# Patient Record
Sex: Female | Born: 1960
Health system: Southern US, Community
[De-identification: ages and names within clinical notes are randomized; demographics above are authoritative.]

## PROBLEM LIST (undated history)

## (undated) DIAGNOSIS — Z8619 Personal history of other infectious and parasitic diseases: Secondary | ICD-10-CM

## (undated) DIAGNOSIS — D649 Anemia, unspecified: Secondary | ICD-10-CM

## (undated) DIAGNOSIS — I499 Cardiac arrhythmia, unspecified: Secondary | ICD-10-CM

## (undated) DIAGNOSIS — J45909 Unspecified asthma, uncomplicated: Secondary | ICD-10-CM

## (undated) DIAGNOSIS — M797 Fibromyalgia: Secondary | ICD-10-CM

## (undated) DIAGNOSIS — M1990 Unspecified osteoarthritis, unspecified site: Secondary | ICD-10-CM

## (undated) DIAGNOSIS — L309 Dermatitis, unspecified: Secondary | ICD-10-CM

## (undated) DIAGNOSIS — T7840XA Allergy, unspecified, initial encounter: Secondary | ICD-10-CM

## (undated) DIAGNOSIS — Z8489 Family history of other specified conditions: Secondary | ICD-10-CM

## (undated) DIAGNOSIS — R739 Hyperglycemia, unspecified: Secondary | ICD-10-CM

## (undated) DIAGNOSIS — E785 Hyperlipidemia, unspecified: Secondary | ICD-10-CM

## (undated) DIAGNOSIS — J189 Pneumonia, unspecified organism: Secondary | ICD-10-CM

## (undated) HISTORY — DX: Personal history of other infectious and parasitic diseases: Z86.19

## (undated) HISTORY — DX: Hyperglycemia, unspecified: R73.9

## (undated) HISTORY — DX: Hyperlipidemia, unspecified: E78.5

## (undated) HISTORY — DX: Dermatitis, unspecified: L30.9

## (undated) HISTORY — DX: Allergy, unspecified, initial encounter: T78.40XA

## (undated) HISTORY — DX: Unspecified osteoarthritis, unspecified site: M19.90

## (undated) HISTORY — DX: Unspecified asthma, uncomplicated: J45.909

## (undated) HISTORY — PX: CERVICAL BIOPSY: SHX590

---

## 2000-06-12 ENCOUNTER — Emergency Department (HOSPITAL_COMMUNITY): Admission: EM | Admit: 2000-06-12 | Discharge: 2000-06-12 | Payer: Self-pay | Admitting: Emergency Medicine

## 2000-10-29 ENCOUNTER — Other Ambulatory Visit: Admission: RE | Admit: 2000-10-29 | Discharge: 2000-10-29 | Payer: Self-pay | Admitting: *Deleted

## 2003-12-28 ENCOUNTER — Ambulatory Visit (HOSPITAL_COMMUNITY): Admission: RE | Admit: 2003-12-28 | Discharge: 2003-12-28 | Payer: Self-pay | Admitting: Obstetrics & Gynecology

## 2004-01-06 ENCOUNTER — Emergency Department (HOSPITAL_COMMUNITY): Admission: EM | Admit: 2004-01-06 | Discharge: 2004-01-06 | Payer: Self-pay | Admitting: Emergency Medicine

## 2004-08-15 ENCOUNTER — Ambulatory Visit (HOSPITAL_COMMUNITY): Admission: RE | Admit: 2004-08-15 | Discharge: 2004-08-15 | Payer: Self-pay | Admitting: Obstetrics & Gynecology

## 2004-08-23 ENCOUNTER — Inpatient Hospital Stay (HOSPITAL_COMMUNITY): Admission: AD | Admit: 2004-08-23 | Discharge: 2004-08-23 | Payer: Self-pay | Admitting: Obstetrics

## 2004-12-27 ENCOUNTER — Inpatient Hospital Stay (HOSPITAL_COMMUNITY): Admission: AD | Admit: 2004-12-27 | Discharge: 2004-12-27 | Payer: Self-pay | Admitting: Obstetrics

## 2005-02-20 ENCOUNTER — Inpatient Hospital Stay (HOSPITAL_COMMUNITY): Admission: AD | Admit: 2005-02-20 | Discharge: 2005-02-20 | Payer: Self-pay | Admitting: Obstetrics & Gynecology

## 2005-03-18 ENCOUNTER — Inpatient Hospital Stay (HOSPITAL_COMMUNITY): Admission: AD | Admit: 2005-03-18 | Discharge: 2005-03-21 | Payer: Self-pay | Admitting: Obstetrics

## 2005-07-22 ENCOUNTER — Encounter: Admission: RE | Admit: 2005-07-22 | Discharge: 2005-08-18 | Payer: Self-pay | Admitting: Obstetrics & Gynecology

## 2005-08-19 ENCOUNTER — Encounter: Admission: RE | Admit: 2005-08-19 | Discharge: 2005-09-18 | Payer: Self-pay | Admitting: Obstetrics & Gynecology

## 2005-09-19 ENCOUNTER — Encounter: Admission: RE | Admit: 2005-09-19 | Discharge: 2005-10-18 | Payer: Self-pay | Admitting: Obstetrics & Gynecology

## 2005-10-19 ENCOUNTER — Encounter: Admission: RE | Admit: 2005-10-19 | Discharge: 2005-11-18 | Payer: Self-pay | Admitting: Obstetrics & Gynecology

## 2005-11-19 ENCOUNTER — Encounter: Admission: RE | Admit: 2005-11-19 | Discharge: 2005-12-18 | Payer: Self-pay | Admitting: Obstetrics & Gynecology

## 2005-12-19 ENCOUNTER — Encounter: Admission: RE | Admit: 2005-12-19 | Discharge: 2006-01-18 | Payer: Self-pay | Admitting: Obstetrics & Gynecology

## 2006-01-19 ENCOUNTER — Encounter: Admission: RE | Admit: 2006-01-19 | Discharge: 2006-02-18 | Payer: Self-pay | Admitting: Obstetrics & Gynecology

## 2006-02-19 ENCOUNTER — Encounter: Admission: RE | Admit: 2006-02-19 | Discharge: 2006-03-20 | Payer: Self-pay | Admitting: Obstetrics & Gynecology

## 2006-03-21 ENCOUNTER — Encounter: Admission: RE | Admit: 2006-03-21 | Discharge: 2006-04-20 | Payer: Self-pay | Admitting: Obstetrics & Gynecology

## 2006-04-21 ENCOUNTER — Encounter: Admission: RE | Admit: 2006-04-21 | Discharge: 2006-05-19 | Payer: Self-pay | Admitting: Obstetrics & Gynecology

## 2006-06-16 HISTORY — PX: BUNIONECTOMY: SHX129

## 2007-03-17 ENCOUNTER — Ambulatory Visit (HOSPITAL_BASED_OUTPATIENT_CLINIC_OR_DEPARTMENT_OTHER): Admission: RE | Admit: 2007-03-17 | Discharge: 2007-03-17 | Payer: Self-pay | Admitting: Orthopedic Surgery

## 2008-12-01 ENCOUNTER — Emergency Department (HOSPITAL_COMMUNITY): Admission: EM | Admit: 2008-12-01 | Discharge: 2008-12-01 | Payer: Self-pay | Admitting: Emergency Medicine

## 2010-02-20 ENCOUNTER — Ambulatory Visit (HOSPITAL_BASED_OUTPATIENT_CLINIC_OR_DEPARTMENT_OTHER): Admission: RE | Admit: 2010-02-20 | Discharge: 2010-02-20 | Payer: Self-pay | Admitting: Orthopedic Surgery

## 2010-02-20 ENCOUNTER — Ambulatory Visit: Payer: Self-pay | Admitting: Diagnostic Radiology

## 2010-02-27 ENCOUNTER — Ambulatory Visit: Payer: Self-pay | Admitting: Family

## 2010-02-27 DIAGNOSIS — J329 Chronic sinusitis, unspecified: Secondary | ICD-10-CM | POA: Insufficient documentation

## 2010-02-27 DIAGNOSIS — R0789 Other chest pain: Secondary | ICD-10-CM | POA: Insufficient documentation

## 2010-02-27 DIAGNOSIS — L259 Unspecified contact dermatitis, unspecified cause: Secondary | ICD-10-CM

## 2010-02-27 DIAGNOSIS — B359 Dermatophytosis, unspecified: Secondary | ICD-10-CM | POA: Insufficient documentation

## 2010-02-28 ENCOUNTER — Telehealth: Payer: Self-pay | Admitting: Family

## 2010-02-28 DIAGNOSIS — E785 Hyperlipidemia, unspecified: Secondary | ICD-10-CM | POA: Insufficient documentation

## 2010-03-06 ENCOUNTER — Telehealth: Payer: Self-pay | Admitting: Family

## 2010-03-15 ENCOUNTER — Ambulatory Visit: Payer: Self-pay | Admitting: Family

## 2010-03-15 DIAGNOSIS — S91309A Unspecified open wound, unspecified foot, initial encounter: Secondary | ICD-10-CM | POA: Insufficient documentation

## 2010-04-03 ENCOUNTER — Telehealth: Payer: Self-pay | Admitting: Family

## 2010-04-17 DIAGNOSIS — Z9189 Other specified personal risk factors, not elsewhere classified: Secondary | ICD-10-CM | POA: Insufficient documentation

## 2010-04-17 DIAGNOSIS — T7840XA Allergy, unspecified, initial encounter: Secondary | ICD-10-CM | POA: Insufficient documentation

## 2010-04-17 DIAGNOSIS — E7800 Pure hypercholesterolemia, unspecified: Secondary | ICD-10-CM

## 2010-04-17 DIAGNOSIS — H8309 Labyrinthitis, unspecified ear: Secondary | ICD-10-CM | POA: Insufficient documentation

## 2010-04-17 DIAGNOSIS — M129 Arthropathy, unspecified: Secondary | ICD-10-CM | POA: Insufficient documentation

## 2010-04-17 HISTORY — DX: Other specified personal risk factors, not elsewhere classified: Z91.89

## 2010-04-18 ENCOUNTER — Ambulatory Visit: Payer: Self-pay | Admitting: Cardiology

## 2010-04-18 ENCOUNTER — Encounter: Payer: Self-pay | Admitting: Cardiology

## 2010-05-23 ENCOUNTER — Telehealth (INDEPENDENT_AMBULATORY_CARE_PROVIDER_SITE_OTHER): Payer: Self-pay | Admitting: *Deleted

## 2010-05-27 ENCOUNTER — Ambulatory Visit (HOSPITAL_COMMUNITY)
Admission: RE | Admit: 2010-05-27 | Discharge: 2010-05-27 | Payer: Self-pay | Source: Home / Self Care | Attending: Cardiology | Admitting: Cardiology

## 2010-05-27 ENCOUNTER — Ambulatory Visit: Payer: Self-pay

## 2010-05-27 ENCOUNTER — Encounter (INDEPENDENT_AMBULATORY_CARE_PROVIDER_SITE_OTHER): Payer: Self-pay | Admitting: *Deleted

## 2010-06-05 ENCOUNTER — Telehealth: Payer: Self-pay | Admitting: Family

## 2010-07-07 ENCOUNTER — Encounter: Payer: Self-pay | Admitting: Obstetrics & Gynecology

## 2010-07-14 LAB — CONVERTED CEMR LAB
ALT: 17 units/L (ref 0–35)
AST: 17 units/L (ref 0–37)
Basophils Absolute: 0 10*3/uL (ref 0.0–0.1)
Bilirubin, Direct: 0.1 mg/dL (ref 0.0–0.3)
Calcium: 9.6 mg/dL (ref 8.4–10.5)
Creatinine, Ser: 0.69 mg/dL (ref 0.40–1.20)
Eosinophils Relative: 4 % (ref 0–5)
HCT: 46.9 % — ABNORMAL HIGH (ref 36.0–46.0)
Hemoglobin: 15.5 g/dL — ABNORMAL HIGH (ref 12.0–15.0)
LDL Cholesterol: 231 mg/dL — ABNORMAL HIGH (ref 0–99)
Lymphocytes Relative: 37 % (ref 12–46)
MCHC: 33 g/dL (ref 30.0–36.0)
Neutro Abs: 4.4 10*3/uL (ref 1.7–7.7)
Neutrophils Relative %: 53 % (ref 43–77)
Pap Smear: NORMAL
RBC: 5.14 M/uL — ABNORMAL HIGH (ref 3.87–5.11)
RDW: 13.5 % (ref 11.5–15.5)
Sodium: 138 meq/L (ref 135–145)
TSH: 1.2 microintl units/mL (ref 0.350–4.500)
Total Bilirubin: 0.9 mg/dL (ref 0.3–1.2)
Total CHOL/HDL Ratio: 5
VLDL: 19 mg/dL (ref 0–40)

## 2010-07-16 NOTE — Progress Notes (Signed)
Summary: Allergic reaction  Phone Note Call from Patient Call back at Home Phone [phone]   Caller: Patient Summary of Call: Pt states doxycycl  HYCL gave her an allergic reaction, hives, itching, throat felt "funny", pt is no longer taking medicine & has received steroid shot and is taking benedryl  Initial call taken by: Lannette Donath,  March 06, 2010 10:15 AM  Follow-up for Phone Call        call returned to patient at [phone], patient states she is at the gym  on a treadmill and will return the call, she can not speak right now Follow-up by: Glendell Docker CMA,  March 06, 2010 1:49 PM  Additional Follow-up for Phone Call Additional follow up Details #1::        Felt itchy, on the 5th day developed hives/rash, eye swelling.  Went to medical services and had a "steroid shot"  Took benadryl and slept for several hours.  Denies SOB,  denies current tongue/lip swelling. Sinuses feel better with benadryl.   Additional Follow-up by: Lemont Fillers FNP,  March 06, 2010 2:36 PM   New Allergies: ! DOXYCYCLINE HYCLATE (DOXYCYCLINE HYCLATE) New Allergies: ! DOXYCYCLINE HYCLATE (DOXYCYCLINE HYCLATE)

## 2010-07-16 NOTE — Assessment & Plan Note (Signed)
Summary: STEP ON NAIL YESTERDAY/ HEA--Rm 4   Vital Signs:  Patient profile:   50 year old female Height:      67 inches Weight:      201.50 pounds BMI:     31.67 Temp:     98.0 degrees F oral Pulse rate:   78 / minute Pulse rhythm:   regular Resp:     16 per minute BP sitting:   120 / 80  (right arm) Cuff size:   regular  Vitals Entered By: Mervin Kung CMA Duncan Dull) (March 15, 2010 3:36 PM) CC: Rm 4  Pt states she stepped on a rusty nail with her left foot yesterday. Last tetanus shot was greater than 10 yrs but less than 5 yrs.   CC:  Rm 4  Pt states she stepped on a rusty nail with her left foot yesterday. Last tetanus shot was greater than 10 yrs but less than 5 yrs..  History of Present Illness: Christine Rodgers is a 50 year old female who presents today following a puncture wound  in her backyard yesterday with a rusty nail.  She was barefoot when this occurred.  She reports that she soaked and cleansed the wound with peroxide.  Pateint reports that her  last tetanus shot was between 5 and 10 years ago.    Allergies: 1)  ! Pcn 2)  ! * Emycins 3)  ! * Anti Inflammatories 4)  ! Asa 5)  ! Codeine 6)  ! Doxycycline Hyclate (Doxycycline Hyclate) 7)  Sulfa  Past History:  Past Medical History: Last updated: 02/27/2010 Arthritis Chicken Pox Allergies Hypercholesterolemia  Past Surgical History: Last updated: 02/27/2010 Bunion removed right foot--2008  Physical Exam  General:  Well-developed,well-nourished,in no acute distress; alert,appropriate and cooperative throughout examination Extremities:  small puncture wound noted on sole of her left foot (in arch).  No drainage or erythema.  + tenderness.   Impression & Recommendations:  Problem # 1:  WOUND, FOOT (ICD-892.0) declines abx.  Pt give tetanus here in the office.  Pt instructed to call us immediately if she develops fever, swelling, redness or drainge from wound.  Complete Medication List: 1)  Voltaren 1  % Gel (Diclofenac sodium) .... Apply up to 4 -5 times a day. 2)  Mirena 20 Mcg/24hr Iud (Levonorgestrel) 3)  Lotrisone 1-0.05 % Crea (Clotrimazole-betamethasone) .... Apply twice daily to affected area until healed. 4)  Doxycycline Hyclate 100 Mg Caps (Doxycycline hyclate) .... One cap by mouth twice daily for 10 days  Other Orders: Tdap => 78yrs IM (81191) Admin 1st Vaccine (47829)  Patient Instructions: 1)  Call if you develop fever, redness, swelling, or increased.    Immunizations Administered:  Tetanus Vaccine:    Vaccine Type: Tdap    Site: left deltoid    Mfr: GlaxoSmithKline    Dose: 0.5 ml    Route: IM    Given by: Mervin Kung CMA (AAMA)    Exp. Date: 04/04/2012    Lot #: [id number]    VIS given: 05/03/08 version given March 15, 2010.   Current Allergies (reviewed today): ! PCN ! * EMYCINS ! * ANTI INFLAMMATORIES ! ASA ! CODEINE ! DOXYCYCLINE HYCLATE (DOXYCYCLINE HYCLATE) SULFA

## 2010-07-16 NOTE — Assessment & Plan Note (Signed)
Summary: new pt cpx/dt--Rm 5   Vital Signs:  Patient profile:   50 year old female Height:      67 inches Weight:      197.50 pounds BMI:     31.04 Temp:     97.9 degrees F oral Pulse rate:   78 / minute Pulse rhythm:   regular Resp:     16 per minute BP sitting:   106 / 78  (right arm) Cuff size:   large  Vitals Entered By: Mervin Kung CMA Duncan Dull) (February 27, 2010 11:32 AM) CC: Rm 5  New pt to establish care. Has eczema on right knee. Has pain behind right eye and right ear for a few weeks. Is Patient Diabetic? No   CC:  Rm 5  New pt to establish care. Has eczema on right knee. Has pain behind right eye and right ear for a few weeks.Marland Kitchen  History of Present Illness: Christine Rodgers is a 49 year old female who presents today to establish care.  She has several concerns today:  1) Eye pain behind right eye, worse when she gazes to the left.  Has had some right ear discomfort.  Denies nasal discharge or fever, no cough.  Denies visual changes or double vision.    2)Notes that she has eczema her whole life.  Has had spot beneath right knee cap x 9 months, itching.   3)Allergies- stable  4)Chest pain- see ROS  Preventive Screening-Counseling & Management  Alcohol-Tobacco     Alcohol drinks/day: 1 glass wine every evening     Smoking Status: quit     Pack years: 1 yr  Caffeine-Diet-Exercise     Caffeine use/day: 2 mugs coffee daily     Does Patient Exercise: yes     Type of exercise: running     Exercise (avg: min/session): 60 min     Times/week: 4  EKG  Procedure date:  02/27/2010  Findings:      normal:  Normal sinus rhythm with rate of:  63 bpm  Allergies (verified): 1)  ! Pcn 2)  ! * Emycins 3)  ! * Anti Inflammatories 4)  ! Asa 5)  ! Codeine 6)  Sulfa  Past History:  Past Medical History: Arthritis Chicken Pox Allergies Hypercholesterolemia  Past Surgical History: Bunion removed right foot--2008  Family History: Mother-- living,  hypercholesterolemia Father--living, arrythmia, hypercholesterolemia diabetes--MGM,MGF, PGM stroke--deceased, PGF, PGM 1 daughter born 44 Abigail--ulcerative colitis  Reuel Boom 1984- healthy Fayrene Fearing [employer]) 2006--healthy  Social History: Occupation: Admin Theatre manager- [employer] Divorced- lives with boyfriend and youngest son Alcohol use-yes, glass of wine/day Regular exercise-yes former smoker -quit smoked briefly as a teenager.Smoking Status:  quit Does Patient Exercise:  yes Caffeine use/day:  2 mugs coffee daily  Review of Systems       Constitutional: Denies Fever ENT:  Denies nasal congestion or sore throat. Resp: Denies cough CV:  Denies Chest Pain-  occasional "stabbing pain" in chest caused by stress, not exacerbated by activity, sometimes radiates up jaw/down the arm, denies associated nausea, goes away on its own.   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  Divorced in 2002. Neuro: Denies numbness     Physical Exam  General:  Well-developed,well-nourished,in no acute distress; alert,appropriate and cooperative throughout examination  Head:  Normocephalic and atraumatic without obvious abnormalities. No apparent alopecia or balding. Eyes:  EOMs grossly intact, vision is grossly intact, sclera are clear. Ears:  External ear exam shows no significant lesions or deformities. Bilateral serous effusions without erythema or bulging Lungs:  Normal respiratory effort, chest expands symmetrically. Lungs are clear to auscultation, no crackles or wheezes. Heart:  Normal rate and regular rhythm. S1 and S2 normal without gallop, murmur, click, rub or other extra sounds. Extremities:  No clubbing, cyanosis, edema, or deformity noted with normal full  range of motion of all joints.   Skin:  approximately 2 inch well circumscribed flat pink round lesion noted on right knee Psych:  Cognition and judgment appear intact. Alert and cooperative with normal attention span and concentration. No apparent delusions, illusions, hallucinations   Impression & Recommendations:  Problem # 1:  RINGWORM (ICD-110.9) Assessment New Has not responded to steroids alone, will give patient trial of lotrisone.  Problem # 2:  SINUSITIS (ICD-473.9) Assessment: New Multiple drug allergies.  Will treat with doxycycline.  Pt was instructed as noted in pt sign out sheet Her updated medication list for this problem includes:    Doxycycline Hyclate 100 Mg Caps (Doxycycline hyclate) ..... One cap by mouth twice daily for 10 days  Problem # 3:  CHEST PAIN, ATYPICAL (ICD-786.59) Assessment: New  EKG performed here in the office notes NSR without ischemic changes.    Orders: Cardiology Referral (Cardiology)  Complete Medication List: 1)  Voltaren 1 % Gel (Diclofenac sodium) .... Apply up to 4 -5 times a day. 2)  Mirena 20 Mcg/24hr Iud (Levonorgestrel) 3)  Lotrisone 1-0.05 % Crea (Clotrimazole-betamethasone) .... Apply twice daily to affected area until healed. 4)  Doxycycline Hyclate 100 Mg Caps (Doxycycline hyclate) .... One cap by mouth twice daily for 10 days  Other Orders: TLB-BMP (Basic Metabolic Panel-BMET) (80048-METABOL) TLB-CBC Platelet - w/Differential (85025-CBCD) TLB-Hepatic/Liver Function Pnl (80076-HEPATIC) TLB-TSH (Thyroid Stimulating Hormone) (84443-TSH) TLB-Lipid Panel (80061-LIPID) EKG w/ Interpretation (93000)  Patient Instructions: 1)  Call if you develop fever over 101, increasing sinus pressure, pain with eye movement, increased facial tenderness of swelling, or if you develop visual changes. 2)  Follow up in 1 month for a complete physical. 3)  You will be contacted about your referral to cardiology. Prescriptions: DOXYCYCLINE  HYCLATE 100 MG CAPS (DOXYCYCLINE HYCLATE) one cap by mouth twice daily for 10 days  #20 x 0   Entered and Authorized by:   Lemont Fillers FNP   Signed by:   Lemont Fillers FNP on 02/27/2010   Method used:   Electronically to        [facility] (retail)       [address]       [place], [place]  [address]       Ph: [phone] or [phone]       Fax: [phone]   RxID:   [id number] LOTRISONE 1-0.05 % CREA (CLOTRIMAZOLE-BETAMETHASONE) apply twice daily to affected area until healed.  #1 x 0   Entered and Authorized by:   Lemont Fillers FNP   Signed by:   Lemont Fillers FNP on 02/27/2010   Method used:   Electronically to        [facility] (retail)       [address]       [place], [place]  [address]       Ph: [phone] or [phone]       Fax: [phone]   RxID:   [id number]   Current Allergies (reviewed today): ! PCN ! * EMYCINS ! * ANTI INFLAMMATORIES ! ASA ! CODEINE SULFA   Preventive Care Screening  Pap Smear:    Date:  04/16/2009    Results:  normal      Not sure when last mammogram was (normal). Can't remember las tetanus. Nicki Guadalajara Fergerson CMA Duncan Dull)  February 27, 2010 11:48 AM

## 2010-07-16 NOTE — Progress Notes (Signed)
Summary: Stress Echo pre procedure  Phone Note Outgoing Call Call back at [facility] Phone [phone]   Call placed by: Rea College, CMA,  May 23, 2010 4:25 PM Call placed to: Patient Summary of Call: Thurston Hole called patient with instructions for Stress Echo on Monday, 05/27/10 @ 3:00 p.m.

## 2010-07-16 NOTE — Progress Notes (Signed)
Summary: cholesterol med concerns  Phone Note Outgoing Call   Summary of Call: Patient should also be scheduled for a physical at her convenience.  Initial call taken by: Lemont Fillers FNP,  February 28, 2010 8:54 PM Summary of Call: Please call patient and let her know that her cholesterol is very high (over 300!).  Other labs look good.I would like to start her on Crestor.  She should be scheduled for f/u labs in 3 months (LFT's/FLP) diagnosis 272.4.  She should call us if she develops muscle pain while on this medication.  She will need to be scheduled for a physical at her convenience. Initial call taken by: Lemont Fillers FNP,  February 28, 2010 8:52 PM  Follow-up for Phone Call        Pt advised per [facility] instructions. Pt is concerned about trying a cholesterol med. She does not want rx sent to pharmacy yet.  Pt states she will call me  back when she is ready to try the med. I advised pt we could give her samples to try first when she is ready.  Nicki Guadalajara Fergerson CMA Duncan Dull)  March 01, 2010 10:50 AM   Additional Follow-up for Phone Call Additional follow up Details #1::        Called patient, discussed risks of untreated high cholesterol including risk of heart attack and stroke.  Pt wants to try diet and exercise for now- declined med therapy at this time but will consider.  Instructed pt to f/u in 3 months for re-evaluation of her cholesterol.  Additional Follow-up by: Lemont Fillers FNP,  March 01, 2010 11:52 AM  New Problems: HYPERLIPIDEMIA (ICD-272.4)   New Problems: HYPERLIPIDEMIA (ICD-272.4)

## 2010-07-16 NOTE — Assessment & Plan Note (Signed)
Summary: np6/ chestpain/pt has uhc/  gd   History of Present Illness: 50 year old female with no prior cardiac history for evaluation of chest pain. Patient states that for the past one year she has had intermittent chest pressure. It is described as a "elephant sitting on her chest". It can radiate to her back and her right arm. It lasts anywhere from minutes to one half day. It is associated with stress and occasionally her menstrual cycles. It is not related to food, not pleuritic and not positional. It is not clearly exertional. There is no associated symptoms. Because of the above we were asked to further evaluate. She denies dyspnea on exertion, orthopnea, PND, pedal edema or syncope.  Current Medications (verified): 1)  Voltaren 1 % Gel (Diclofenac Sodium) .... Apply Up To 4 -5 Times A Day. 2)  Mirena 20 Mcg/24hr Iud (Levonorgestrel) .... As Directed 3)  Lotrisone 1-0.05 % Crea (Clotrimazole-Betamethasone) .... Apply Twice Daily To Affected Area Until Healed.  Allergies: 1)  ! Pcn 2)  ! * Emycins 3)  ! * Anti Inflammatories 4)  ! Asa 5)  ! Codeine 6)  ! Doxycycline Hyclate (Doxycycline Hyclate) 7)  Sulfa  Past History:  Past Medical History: HYPERLIPIDEMIA CHICKENPOX, HX OF ECZEMA   Past Surgical History: Reviewed history from 02/27/2010 and no changes required. Bunion removed right foot--2008  Family History: Reviewed history from 02/27/2010 and no changes required. Mother-- living, hypercholesterolemia Father--living, arrythmia, hypercholesterolemia diabetes--MGM,MGF, PGM stroke--deceased, PGF, PGM 1 daughter born 63 Abigail--ulcerative colitis  Reuel Boom 1984- healthy Fayrene Fearing [employer]) 2006--healthy  Social History: Reviewed history from 02/27/2010 and no changes required. Occupation: Chartered certified accountant- [employer] Divorced- lives with boyfriend and youngest son Alcohol use-yes, glass of wine/day Regular exercise-yes former smoker -quit smoked briefly as a  teenager  Review of Systems       no fevers or chills, productive cough, hemoptysis, dysphasia, odynophagia, melena, hematochezia, dysuria, hematuria, rash, seizure activity, orthopnea, PND, pedal edema, claudication. Remaining systems are negative.   Vital Signs:  Patient profile:   50 year old female Height:      67 inches Weight:      202 pounds BMI:     31.75 Pulse rate:   75 / minute Resp:     14 per minute BP sitting:   130 / 80  (left arm)  Vitals Entered By: Kem Parkinson (April 18, 2010 3:35 PM)  Physical Exam  General:  Well developed/well nourished in NAD Skin warm/dry Patient not depressed No peripheral clubbing Back-normal HEENT-normal/normal eyelids Neck supple/normal carotid upstroke bilaterally; no bruits; no JVD; no thyromegaly chest - CTA/ normal expansion CV - RRR/normal S1 and S2; no murmurs, rubs or gallops;  PMI nondisplaced Abdomen -NT/ND, no HSM, no mass, + bowel sounds, no bruit 2+ femoral pulses, no bruits Ext-no edema, chords, 2+ DP Neuro-grossly nonfocal     EKG  Procedure date:  04/18/2010  Findings:      Sinus rhythm with no ST changes.  Impression & Recommendations:  Problem # 1:  CHEST PAIN, ATYPICAL (ICD-786.59) Symptoms atypical. However LDL greater than 200. Schedule stress echocardiogram for risk stratification. Orders: EKG w/ Interpretation (93000) Stress Echo (Stress Echo)  Problem # 2:  HYPERLIPIDEMIA (ICD-272.4) Patient does not want to take medications at this point. I strongly encouraged diet and followup with her primary care physician for this issue.  Problem # 3:  ECZEMA (ICD-692.9)  Patient Instructions: 1)  Your physician recommends that you schedule a follow-up appointment in:  AS NEEDED 2)  Your physician recommends that you continue on your current medications as directed. Please refer to the Current Medication list given to you today. 3)  Your physician has requested that you have a stress  echocardiogram. For further information please visit [email or web address]  Please follow instruction sheet as given.

## 2010-07-16 NOTE — Progress Notes (Signed)
Summary: need to r/s card. appt  Phone Note Outgoing Call   Summary of Call: Patient cancelled her cardiology appointment with Dr. Jens Som.  Please call her and encourage her to reschedule this appointment.  Let her know that it is important that her chest pain should be evaluated by a cardiologist- especially given how high her cholesterol is.   Initial call taken by: Lemont Fillers FNP,  April 03, 2010 1:12 PM  Follow-up for Phone Call        Left message on machine to return my call. Nicki Guadalajara Fergerson CMA Duncan Dull)  April 03, 2010 1:24 PM   Additional Follow-up for Phone Call Additional follow up Details #1::        Pt returned my call. Advised pt per Melissa's instruction. Pt states she cancelled appt. with Cardiology due to financial and work concerns. Pt states she will check her calendar and see how soon she can arrange an appt. with Dr Jens Som. Pt requested number to call and r/s appt.  Number was provided. Nicki Guadalajara Fergerson CMA Duncan Dull)  April 03, 2010 4:05 PM

## 2010-07-18 NOTE — Letter (Signed)
Summary: Outpatient Coinsurance Notice  Outpatient Coinsurance Notice   Imported By: Marylou Mccoy 06/03/2010 18:48:53  _____________________________________________________________________  External Attachment:    Type:   Image     Comment:   External Document

## 2010-07-18 NOTE — Progress Notes (Signed)
Summary: CPX appt  Phone Note Outgoing Call   Summary of Call: Please call patient and arrange a follow up FLP and apt for CPX.  She is due for these services. Initial call taken by: Lemont Fillers FNP,  June 05, 2010 2:29 PM  Follow-up for Phone Call        call placed to patient at [phone] , she has been informed per [facility] instructions. She states that she will call back after the new year to schedule Follow-up by: Glendell Docker CMA,  June 05, 2010 3:40 PM

## 2010-09-23 LAB — POCT I-STAT, CHEM 8
BUN: 12 mg/dL (ref 6–23)
Calcium, Ion: 1.12 mmol/L (ref 1.12–1.32)
Creatinine, Ser: 0.8 mg/dL (ref 0.4–1.2)
Glucose, Bld: 97 mg/dL (ref 70–99)
HCT: 46 % (ref 36.0–46.0)
Hemoglobin: 15.6 g/dL — ABNORMAL HIGH (ref 12.0–15.0)

## 2010-10-29 NOTE — Op Note (Signed)
NAME:  Christine Rodgers, STEGEMAN               ACCOUNT NO.:  [id number]   MEDICAL RECORD NO.:  [id number]          PATIENT TYPE:  AMB   LOCATION:  DSC                          FACILITY:  [facility]   PHYSICIAN:  Dyke Brackett, M.D.    DATE OF BIRTH:  03-Aug-1960   DATE OF PROCEDURE:  03/17/2007  DATE OF DISCHARGE:                               OPERATIVE REPORT   INDICATIONS:  A 50 year old with a painful bunion of the right foot,  thought to be amenable to outpatient surgery.   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 Bunionectomy with metatarsal osteotomy.  2. Medial capsule plication.  3. Adductor tendon release.  4. Repair of extensor hallucis longus.   SURGEON:  Dyke Brackett, M.D.   ASSISTANT:  Arlys John D. Petrarca, P.A.-C.   TOURNIQUET TIME:  Approximately 1 hour 30 minutes.   DESCRIPTION OF PROCEDURE:  Sterile prep and drape, exsanguination of the  leg placed in 350.  Incisions were made between the first and second toe  on the medial aspect of foot over a bunion, as well as proximally over  the first metatarsal base.  Dissection was carried down through the  first incision and the adductor  tendon was released without difficulty.  A straight skin incision was done to expose this significant bunion,  particularly relative to pronation of the toe in subluxation over almost  all of the toe relative to the joint.  A distally-based flap was  created,  followed by burring and sawing off  the bony bunion.  A  metatarsal osteotomy was next accomplished from a dorsal incision.  The  extensor tendon was carefully protected.  Crescentric saw blade was  protected with 2 Homan retractors; but due to the large size of the  crescentic saw, the edge of the saw blade severed the extensor hallucis  (even when the retractors were in  place).  This was recognized and  repaired with modified Bunnell's sutures on each end of the tendon,  which repaired the tendon anatomically.  Prior to this, the osteotomy  was created distally a dome-shaped metatarsal osteotomy, distally based;  affixed with 0.062 K-wires -- with good restoration or improvement on  the intermetatarsal angle (which was preoperatively about 13-14 degrees,  and looked approximately 0 intraoperatively on the C-arm).  Again, the C-  arm confirmed good positioning relative to the pins.  The capsule  plication was reapproximated with #2-0 fiber wire, imbricating the  capsule on the side of the metatarsal phalangeal joint; to pull the toe  back into position relative to the metatarsal-phalangeal joint.  Again,  x-rays confirmed good  positioning after capsular plication.  The wounds were irrigated and  closed with interrupted nylon; infiltrated with Marcaine.  A lightly  compressive sterile dressing and a posterior plaster splint applied with  a pancake dorsally.  The patient was taken to the recovery room in  stable condition.      W.  Dava Najjar, M.D.  Electronically Signed     WDC/MEDQ  D:  03/17/2007  T:  03/18/2007  Job:  [id number]

## 2011-01-16 ENCOUNTER — Encounter: Payer: Self-pay | Admitting: Internal Medicine

## 2011-01-17 ENCOUNTER — Ambulatory Visit (INDEPENDENT_AMBULATORY_CARE_PROVIDER_SITE_OTHER): Payer: 59 | Admitting: Internal Medicine

## 2011-01-17 ENCOUNTER — Encounter: Payer: Self-pay | Admitting: Internal Medicine

## 2011-01-17 DIAGNOSIS — J069 Acute upper respiratory infection, unspecified: Secondary | ICD-10-CM

## 2011-01-17 DIAGNOSIS — R062 Wheezing: Secondary | ICD-10-CM

## 2011-01-17 MED ORDER — ALBUTEROL 90 MCG/ACT IN AERS: 2.0000 | INHALATION_SPRAY | Freq: Four times a day (QID) | RESPIRATORY_TRACT | Status: DC | PRN

## 2011-01-17 MED ORDER — LEVOFLOXACIN 500 MG PO TABS: 500.0000 mg | ORAL_TABLET | Freq: Every day | ORAL | Status: AC

## 2011-01-19 DIAGNOSIS — J069 Acute upper respiratory infection, unspecified: Secondary | ICD-10-CM | POA: Insufficient documentation

## 2011-01-19 DIAGNOSIS — R062 Wheezing: Secondary | ICD-10-CM | POA: Insufficient documentation

## 2011-01-19 NOTE — Assessment & Plan Note (Signed)
Recommend abx tx. Reviewed multiple allergies. Attempt levaquin. followup if no improvement or worsening.

## 2011-01-19 NOTE — Progress Notes (Signed)
  Subjective:    Patient ID: Christine Rodgers, female    DOB: 09-Jan-1961, 50 y.o.   MRN: [id number]  HPI Pt presents to clinic for evaluation of cough. Notes ~7 day h/o cough productive for yellow sputum, ear pain and nasal congestion. Initially improved but now worsened with intermittent mild wheezing without dyspnea. +myalgias. No sick exposures. Attempting otc medication. No other exacerbating or alleviating factors. No other complaints.  Reviewed pmh, medications and allergies.    Review of Systems see hpi     Objective:   Physical Exam  Nursing note and vitals reviewed. Constitutional: She appears well-developed and well-nourished. No distress.  HENT:  Head: Normocephalic and atraumatic.  Right Ear: Tympanic membrane, external ear and ear canal normal.  Left Ear: Tympanic membrane, external ear and ear canal normal.  Nose: Nose normal.  Mouth/Throat: Oropharynx is clear and moist. No oropharyngeal exudate.  Eyes: Conjunctivae are normal. No scleral icterus.  Neck: Neck supple.  Cardiovascular: Normal rate, regular rhythm and normal heart sounds.  Exam reveals no gallop and no friction rub.   No murmur heard. Pulmonary/Chest: Effort normal and breath sounds normal. No respiratory distress. She has no wheezes. She has no rales.  Lymphadenopathy:    She has no cervical adenopathy.  Neurological: She is alert.  Skin: Skin is warm and dry. No rash noted. She is not diaphoretic. No erythema.  Psychiatric: She has a normal mood and affect.          Assessment & Plan:

## 2011-01-19 NOTE — Assessment & Plan Note (Signed)
Attempt albuterol mdi prn. followup if no improvement or worsening.

## 2011-01-21 ENCOUNTER — Other Ambulatory Visit (HOSPITAL_COMMUNITY): Payer: Self-pay | Admitting: Certified Nurse Midwife

## 2011-01-21 DIAGNOSIS — Z1231 Encounter for screening mammogram for malignant neoplasm of breast: Secondary | ICD-10-CM

## 2011-01-30 ENCOUNTER — Ambulatory Visit (HOSPITAL_COMMUNITY)
Admission: RE | Admit: 2011-01-30 | Discharge: 2011-01-30 | Disposition: A | Discharge status: Home / Self Care | Payer: 59 | Source: Ambulatory Visit | Attending: Certified Nurse Midwife | Admitting: Certified Nurse Midwife

## 2011-01-30 DIAGNOSIS — Z1231 Encounter for screening mammogram for malignant neoplasm of breast: Secondary | ICD-10-CM | POA: Insufficient documentation

## 2011-03-27 LAB — POCT HEMOGLOBIN-HEMACUE
Hemoglobin: 15.3 — ABNORMAL HIGH
Operator id: 208731

## 2011-10-15 ENCOUNTER — Ambulatory Visit (INDEPENDENT_AMBULATORY_CARE_PROVIDER_SITE_OTHER): Payer: 59 | Admitting: Family

## 2011-10-15 ENCOUNTER — Ambulatory Visit (HOSPITAL_BASED_OUTPATIENT_CLINIC_OR_DEPARTMENT_OTHER)
Admission: RE | Admit: 2011-10-15 | Discharge: 2011-10-15 | Disposition: A | Discharge status: Home / Self Care | Payer: 59 | Source: Ambulatory Visit | Attending: Family | Admitting: Family

## 2011-10-15 ENCOUNTER — Ambulatory Visit: Payer: 59 | Admitting: Family

## 2011-10-15 ENCOUNTER — Encounter: Payer: Self-pay | Admitting: Family

## 2011-10-15 VITALS — BP 116/76 | HR 115 | Temp 97.9°F | Resp 18 | Wt 209.1 lb

## 2011-10-15 DIAGNOSIS — R918 Other nonspecific abnormal finding of lung field: Secondary | ICD-10-CM

## 2011-10-15 DIAGNOSIS — R059 Cough, unspecified: Secondary | ICD-10-CM | POA: Insufficient documentation

## 2011-10-15 DIAGNOSIS — J45901 Unspecified asthma with (acute) exacerbation: Secondary | ICD-10-CM

## 2011-10-15 DIAGNOSIS — R05 Cough: Secondary | ICD-10-CM

## 2011-10-15 MED ORDER — FLUTICASONE-SALMETEROL 250-50 MCG/DOSE IN AEPB: 1.0000 | INHALATION_SPRAY | Freq: Two times a day (BID) | RESPIRATORY_TRACT | Status: DC

## 2011-10-15 NOTE — Progress Notes (Signed)
Subjective:    Patient ID: Christine Rodgers, female    DOB: Nov 10, 1960, 51 y.o.   MRN: [id number]  HPI  Christine Rodgers is a 50 yr old female who presents today with chief complaint of cough.  She reports that she developed cold symptoms one week ago, and more recently developed a worsening cough.  Reports that cough is productive of clear sputum.  Reports associated wheezing and bad "asthma attack" last night. She tried sudafed last night, and was afraid to take albuterol so she did not use her rescue inhaler.  Has mild nasal congestion. No associated fever. She reports that her father recently died from pneumonia/mrsa and she is worried that she too could have pneumonia.    Review of Systems See HPI  Past Medical History  Diagnosis Date  . Hyperlipidemia   . History of chicken pox   . Eczema     History   Social History  . Marital Status: Divorced    Spouse Name: N/A    Number of Children: N/A  . Years of Education: N/A   Occupational History  . Not on file.   Social History Main Topics  . Smoking status: Former Games developer  . Smokeless tobacco: Never Used  . Alcohol Use: 3.0 oz/week    5 Glasses of wine per week  . Drug Use: Not on file  . Sexually Active: Not on file   Other Topics Concern  . Not on file   Social History Narrative   Regular exercise:  Yes    Past Surgical History  Procedure Date  . Bunionectomy 2008    right foot    Family History  Problem Relation Age of Onset  . Hyperlipidemia Mother   . Hyperlipidemia Father   . Arrhythmia Father   . Ulcerative colitis Daughter   . Diabetes Maternal Grandmother   . Diabetes Maternal Grandfather   . Diabetes Paternal Grandmother   . Stroke Paternal Grandmother   . Stroke Paternal Grandfather     Allergies  Allergen Reactions  . Aspirin     REACTION: severe nausea  . Codeine     REACTION: severe nausea  . Doxycycline Hyclate     REACTION: hives, throat swelling  . Penicillins     REACTION: passed  out  . Sulfonamide Derivatives     REACTION: diarrhea, vomitting    Current Outpatient Prescriptions on File Prior to Visit  Medication Sig Dispense Refill  . albuterol (PROVENTIL,VENTOLIN) 90 MCG/ACT inhaler Inhale 2 puffs into the lungs every 6 (six) hours as needed for wheezing.  17 g  1  . clotrimazole-betamethasone (LOTRISONE) cream Apply 1 application topically 2 (two) times daily. To affected area until healed.       . diclofenac sodium (VOLTAREN) 1 % GEL Apply 1 application topically. 4-5 times daily as needed.       Marland Kitchen levonorgestrel (MIRENA) 20 MCG/24HR IUD 1 each by Intrauterine route once.        . Fluticasone-Salmeterol (ADVAIR) 250-50 MCG/DOSE AEPB Inhale 1 puff into the lungs 2 (two) times daily.  28 each  0    BP 116/76  Pulse 115  Temp(Src) 97.9 F (36.6 C) (Oral)  Resp 18  Wt 209 lb 1.9 oz (94.856 kg)  SpO2 99%       Objective:   Physical Exam  Constitutional: She is oriented to person, place, and time. She appears well-developed and well-nourished.  HENT:  Head: Normocephalic and atraumatic.  Right Ear: Tympanic membrane and  ear canal normal.  Left Ear: Tympanic membrane and ear canal normal.  Mouth/Throat: No posterior oropharyngeal edema or posterior oropharyngeal erythema.  Cardiovascular: Normal rate and regular rhythm.   No murmur heard. Pulmonary/Chest: Effort normal and breath sounds normal. No respiratory distress. She has no wheezes. She has no rales. She exhibits no tenderness.  Neurological: She is alert and oriented to person, place, and time.  Skin: Skin is warm and dry.  Psychiatric: She has a normal mood and affect. Her behavior is normal. Judgment and thought content normal.          Assessment & Plan:

## 2011-10-15 NOTE — Patient Instructions (Signed)
Please complete your chest x-ray on the first floor.  Call if symptoms worsen, or if you are not feeling better in 2-3 days.

## 2011-10-15 NOTE — Assessment & Plan Note (Signed)
She has multiple antibiotic allergies.  Will plan to treat with Advair for the next few weeks.  No overt wheezing on exam today, therefore will not place on PO abx today.  Can consider if symptoms worsen or do not improve with advair. Continue rescue inhaler PRN.  Will obtain CXR to exclude pneumonia given her concerns, but clinically doubt pneumonia.

## 2011-10-16 ENCOUNTER — Telehealth: Payer: Self-pay | Admitting: Family

## 2011-10-16 MED ORDER — LEVOFLOXACIN 500 MG PO TABS: 500.0000 mg | ORAL_TABLET | Freq: Every day | ORAL | Status: AC

## 2011-10-24 NOTE — Telephone Encounter (Signed)
Left message on cell to see how she is feeling.  Also, left message reminding pt that she will need a follow up chest x-ray and to call our office to arrange a follow up appointment.

## 2011-10-24 NOTE — Telephone Encounter (Signed)
Late entry- spoke with pt AM 5/2, reviewed cxr results and possible early pneumonia. Advised pt to start levaquin, call if symptoms worsen or if no improvement in 2-3 days.

## 2011-10-28 ENCOUNTER — Telehealth: Payer: Self-pay | Admitting: *Deleted

## 2011-10-28 NOTE — Telephone Encounter (Signed)
I would like to see her back in the office please.  I will assess her and possibly repeat cxr to determine if she needs antibiotics at this point.  She can use immodium prn diarrhea and benadryl as needed for rash.

## 2011-10-28 NOTE — Telephone Encounter (Signed)
Received message from pt stating she did not start Levaquin when it was first prescribed because she was afraid she would have a reaction. Pt states she has taken it for 2 days now and she has diarrhea, rash all over, dizziness and her right knee is very painful. Please advise if there is another antibiotic pt can try and what can she do for her side effects?

## 2011-10-28 NOTE — Telephone Encounter (Signed)
Notified pt and scheduled f/u for tomorrow at 9:30am.

## 2011-10-29 ENCOUNTER — Encounter: Payer: Self-pay | Admitting: Family

## 2011-10-29 ENCOUNTER — Ambulatory Visit (HOSPITAL_BASED_OUTPATIENT_CLINIC_OR_DEPARTMENT_OTHER)
Admission: RE | Admit: 2011-10-29 | Discharge: 2011-10-29 | Disposition: A | Discharge status: Home / Self Care | Payer: 59 | Source: Ambulatory Visit | Attending: Family | Admitting: Family

## 2011-10-29 ENCOUNTER — Ambulatory Visit (INDEPENDENT_AMBULATORY_CARE_PROVIDER_SITE_OTHER): Payer: 59 | Admitting: Family

## 2011-10-29 ENCOUNTER — Telehealth: Payer: Self-pay | Admitting: Family

## 2011-10-29 VITALS — BP 110/70 | HR 87 | Temp 97.7°F | Resp 18 | Wt 208.0 lb

## 2011-10-29 DIAGNOSIS — J45901 Unspecified asthma with (acute) exacerbation: Secondary | ICD-10-CM

## 2011-10-29 DIAGNOSIS — K921 Melena: Secondary | ICD-10-CM | POA: Insufficient documentation

## 2011-10-29 DIAGNOSIS — M25561 Pain in right knee: Secondary | ICD-10-CM | POA: Insufficient documentation

## 2011-10-29 DIAGNOSIS — J189 Pneumonia, unspecified organism: Secondary | ICD-10-CM | POA: Insufficient documentation

## 2011-10-29 DIAGNOSIS — M25569 Pain in unspecified knee: Secondary | ICD-10-CM

## 2011-10-29 LAB — CBC WITH DIFFERENTIAL/PLATELET
Basophils Relative: 1 % (ref 0–1)
Eosinophils Absolute: 0.4 10*3/uL (ref 0.0–0.7)
Eosinophils Relative: 5 % (ref 0–5)
Lymphocytes Relative: 35 % (ref 12–46)
Monocytes Relative: 6 % (ref 3–12)
Neutro Abs: 4 10*3/uL (ref 1.7–7.7)
WBC: 7.5 10*3/uL (ref 4.0–10.5)

## 2011-10-29 MED ORDER — DICLOFENAC SODIUM 1 % TD GEL: 1.0000 "application " | Freq: Four times a day (QID) | TRANSDERMAL | Status: DC

## 2011-10-29 MED ORDER — ALBUTEROL SULFATE HFA 108 (90 BASE) MCG/ACT IN AERS: 2.0000 | INHALATION_SPRAY | Freq: Four times a day (QID) | RESPIRATORY_TRACT | Status: DC | PRN

## 2011-10-29 NOTE — Telephone Encounter (Signed)
Reviewed CXR- neg for pneumonia.  Left detailed message on cell phone Re: neg cxr and to contact us if symptoms worsen, or if symptoms do not continue to improve.

## 2011-10-29 NOTE — Assessment & Plan Note (Signed)
Improved with use of advair and prn albuterol.

## 2011-10-29 NOTE — Assessment & Plan Note (Signed)
Likely due to OA.  Trial of voltaren gel, ice prn.  If symptoms worsen or do not improve, consider further imaging.

## 2011-10-29 NOTE — Progress Notes (Signed)
Subjective:    Patient ID: Christine Rodgers, female    DOB: Jun 11, 1961, 51 y.o.   MRN: [id number]  HPI  Ms.  Christine Rodgers is a 51 yr old female who presents today for follow up of her cough and pneumonia. She recently got married and tells me that she was afraid to start the levaquin prior to her wedding due to concern that she may have a drug reaction (she is allergic to multiple other antibiotics).  She then started levaquin on Sunday night.  Had diarrhea the first night.  Took second dose.  That day she developed rash and dizziness.  She did not have tongue/lip swelling. See phone note. She has taken no further levaquin.  Rash is now resolved.   Pneumonia/Cough- Cough is improved- but not resolved. She reports advair really helped her to breath better.  She denies fever. Cough is productive of mostly clear/white phlegm.  Family is all sick with cough.  R knee pain- she reports that she has some paing behind the right knee.  Worse with kneeling, going up ths steps.  Worsening last two days- she is limping.  Knee is stiff.  Has had problems with this knee in the past.  Has used voltaren gel in the past which helped.   Hematochezia- reports that she will often see bright red blood with straining, has never had a colo.   Review of Systems See HPI  Past Medical History  Diagnosis Date  . Hyperlipidemia   . History of chicken pox   . Eczema     History   Social History  . Marital Status: Divorced    Spouse Name: N/A    Number of Children: N/A  . Years of Education: N/A   Occupational History  . Not on file.   Social History Main Topics  . Smoking status: Former Games developer  . Smokeless tobacco: Never Used  . Alcohol Use: 3.0 oz/week    5 Glasses of wine per week  . Drug Use: Not on file  . Sexually Active: Not on file   Other Topics Concern  . Not on file   Social History Narrative   Regular exercise:  Yes    Past Surgical History  Procedure Date  . Bunionectomy 2008    right  foot    Family History  Problem Relation Age of Onset  . Hyperlipidemia Mother   . Hyperlipidemia Father   . Arrhythmia Father   . Ulcerative colitis Daughter   . Diabetes Maternal Grandmother   . Diabetes Maternal Grandfather   . Diabetes Paternal Grandmother   . Stroke Paternal Grandmother   . Stroke Paternal Grandfather     Allergies  Allergen Reactions  . Aspirin     REACTION: severe nausea  . Codeine     REACTION: severe nausea  . Doxycycline Hyclate     REACTION: hives, throat swelling  . Penicillins     REACTION: passed out  . Sulfonamide Derivatives     REACTION: diarrhea, vomitting  . Levaquin (Levofloxacin In D5w) Rash    Current Outpatient Prescriptions on File Prior to Visit  Medication Sig Dispense Refill  . Fluticasone-Salmeterol (ADVAIR) 250-50 MCG/DOSE AEPB Inhale 1 puff into the lungs 2 (two) times daily.  28 each  0  . levonorgestrel (MIRENA) 20 MCG/24HR IUD 1 each by Intrauterine route once.        Marland Kitchen albuterol (PROVENTIL HFA;VENTOLIN HFA) 108 (90 BASE) MCG/ACT inhaler Inhale 2 puffs into the lungs every 6 (six)  hours as needed for wheezing.  1 Inhaler  0  . clotrimazole-betamethasone (LOTRISONE) cream Apply 1 application topically 2 (two) times daily. To affected area until healed.         BP 110/70  Pulse 87  Temp(Src) 97.7 F (36.5 C) (Oral)  Resp 18  Wt 208 lb 0.6 oz (94.366 kg)  SpO2 99%       Objective:   Physical Exam  Constitutional: She appears well-developed and well-nourished. No distress.  Cardiovascular: Normal rate and regular rhythm.   No murmur heard. Pulmonary/Chest: Effort normal and breath sounds normal. No respiratory distress. She has no wheezes. She has no rales. She exhibits no tenderness.  Musculoskeletal: She exhibits no edema.       R knee is without swelling, erythema or tenderness. Full rom is noted. Neg Drawer sign.  Psychiatric: She has a normal mood and affect. Her behavior is normal. Thought content normal.           Assessment & Plan:

## 2011-10-29 NOTE — Assessment & Plan Note (Signed)
Occurs with straining only.  Likely hemorrhoids, but pt has never had screening colo. Refer to GI for further eval and will obtain CBC.

## 2011-10-29 NOTE — Patient Instructions (Signed)
You will be contact about your referral to gastoenterology for colonoscopy.  Please let us know if you have not heard back within 1 week about your referral. Please schedule a fasting physical.

## 2011-10-29 NOTE — Assessment & Plan Note (Signed)
CXR last visit questioned pna.  She has only taken 2 tabs of levaquin since that time.  Will repeat CXR today to evaluate for resolution.  Continue abx for now,.

## 2011-11-03 ENCOUNTER — Telehealth: Payer: Self-pay | Admitting: *Deleted

## 2011-11-03 NOTE — Telephone Encounter (Signed)
I would recommend trial of claritin-D and mucinex.  If no improvement, if symptoms worsen, or if fever, she should be re-evaluated in the office,

## 2011-11-03 NOTE — Telephone Encounter (Signed)
Received voice message from pt that she stopped her abx last week as advised by [place] as cxr was negative for pneumonia. States that her sore throat and chest congestion has returned and she wants to know what she should do?  Please advise.

## 2011-11-04 ENCOUNTER — Encounter: Payer: Self-pay | Admitting: Family

## 2011-11-04 ENCOUNTER — Ambulatory Visit (INDEPENDENT_AMBULATORY_CARE_PROVIDER_SITE_OTHER): Payer: 59 | Admitting: Family

## 2011-11-04 VITALS — BP 124/78 | HR 108 | Temp 97.8°F | Resp 16 | Ht 67.01 in | Wt 209.0 lb

## 2011-11-04 DIAGNOSIS — J020 Streptococcal pharyngitis: Secondary | ICD-10-CM

## 2011-11-04 DIAGNOSIS — J029 Acute pharyngitis, unspecified: Secondary | ICD-10-CM

## 2011-11-04 LAB — POCT RAPID STREP A (OFFICE): Rapid Strep A Screen: POSITIVE — AB

## 2011-11-04 MED ORDER — CEFUROXIME AXETIL 500 MG PO TABS: 500.0000 mg | ORAL_TABLET | Freq: Two times a day (BID) | ORAL | Status: AC

## 2011-11-04 NOTE — Assessment & Plan Note (Signed)
Rapid strep is +.  Rx is very challenging given multiple drug allergies.  She tells me that she has taken keflex in the past without problem, only had mild GI side effects.  Will rx with ceftin. I recommended that she take benadryl 30 minutes prior to doses for the first few days.  Go to ER if SOB, tongue/lip swelling.

## 2011-11-04 NOTE — Progress Notes (Signed)
Subjective:    Patient ID: Christine Rodgers, female    DOB: 05-12-1961, 51 y.o.   MRN: [id number]  HPI  Ms.  Dutch Quint is a 51 yr old female who presents today with chief complaint of sore throat. She reports that on 5/19 she felt "funny in my throat."  Monday, symptoms worsened throughout the day.  She then started coughing.  Her co-worker was hospitalized for strep a few weeks ago.  Last night really started coughing.   She was using collodial silver oral preparation without improvement.  Last night tried warm salt water, tea and honey without relief. She also reports + pus on back of throat last night.     Review of Systems    see HPI  Past Medical History  Diagnosis Date  . Hyperlipidemia   . History of chicken pox   . Eczema     History   Social History  . Marital Status: Divorced    Spouse Name: N/A    Number of Children: N/A  . Years of Education: N/A   Occupational History  . Not on file.   Social History Main Topics  . Smoking status: Former Games developer  . Smokeless tobacco: Never Used  . Alcohol Use: 3.0 oz/week    5 Glasses of wine per week  . Drug Use: Not on file  . Sexually Active: Not on file   Other Topics Concern  . Not on file   Social History Narrative   Regular exercise:  Yes    Past Surgical History  Procedure Date  . Bunionectomy 2008    right foot    Family History  Problem Relation Age of Onset  . Hyperlipidemia Mother   . Hyperlipidemia Father   . Arrhythmia Father   . Ulcerative colitis Daughter   . Diabetes Maternal Grandmother   . Diabetes Maternal Grandfather   . Diabetes Paternal Grandmother   . Stroke Paternal Grandmother   . Stroke Paternal Grandfather     Allergies  Allergen Reactions  . Aspirin     REACTION: severe nausea  . Codeine     REACTION: severe nausea  . Doxycycline Hyclate     REACTION: hives, throat swelling  . Penicillins     REACTION: passed out  . Sulfonamide Derivatives     REACTION: diarrhea,  vomitting  . Levaquin (Levofloxacin In D5w) Rash    Current Outpatient Prescriptions on File Prior to Visit  Medication Sig Dispense Refill  . albuterol (PROVENTIL HFA;VENTOLIN HFA) 108 (90 BASE) MCG/ACT inhaler Inhale 2 puffs into the lungs every 6 (six) hours as needed for wheezing.  1 Inhaler  0  . clotrimazole-betamethasone (LOTRISONE) cream Apply 1 application topically 2 (two) times daily. To affected area until healed.       . diclofenac sodium (VOLTAREN) 1 % GEL Apply 1 application topically 4 (four) times daily. 4-5 times daily as needed.  100 g  1  . Fluticasone-Salmeterol (ADVAIR) 250-50 MCG/DOSE AEPB Inhale 1 puff into the lungs 2 (two) times daily.  28 each  0  . levonorgestrel (MIRENA) 20 MCG/24HR IUD 1 each by Intrauterine route once.          BP 124/78  Pulse 108  Temp(Src) 97.8 F (36.6 C) (Oral)  Resp 16  Ht 5' 7.01" (1.702 m)  Wt 209 lb 0.6 oz (94.82 kg)  BMI 32.73 kg/m2  SpO2 98%    Objective:   Physical Exam  Constitutional: She appears well-developed and well-nourished. She appears distressed.  HENT:  Head: Normocephalic and atraumatic.  Right Ear: Tympanic membrane and ear canal normal.  Left Ear: Tympanic membrane and ear canal normal.  Mouth/Throat: Posterior oropharyngeal erythema present. No posterior oropharyngeal edema.  Cardiovascular: Normal rate and regular rhythm.   No murmur heard. Pulmonary/Chest: Effort normal and breath sounds normal. No respiratory distress. She has no wheezes. She has no rales. She exhibits no tenderness.  Musculoskeletal: She exhibits no edema.  Lymphadenopathy:    She has no cervical adenopathy.  Skin: Skin is warm and dry. No rash noted.  Psychiatric: She has a normal mood and affect. Her behavior is normal. Judgment and thought content normal.          Assessment & Plan:

## 2011-11-04 NOTE — Patient Instructions (Signed)
Start ceftin this evening.  Take 25 mg benadryl 30 minutes prior to dose. Call if rash, go to ER if tongue/lip swelling, shortness of breath occur.

## 2011-11-04 NOTE — Telephone Encounter (Signed)
Spoke to pt, she states she was feeling very bad last night. Now has productive cough with green phlegm. Noted "pus" on her tonsils last night. Scheduled pt to see Sandford Craze, NP at 10:30 today.

## 2011-11-27 ENCOUNTER — Encounter: Payer: Self-pay | Admitting: Gastroenterology

## 2012-01-12 ENCOUNTER — Ambulatory Visit (INDEPENDENT_AMBULATORY_CARE_PROVIDER_SITE_OTHER): Payer: 59 | Admitting: Family

## 2012-01-12 ENCOUNTER — Other Ambulatory Visit: Payer: Self-pay | Admitting: Family

## 2012-01-12 ENCOUNTER — Encounter: Payer: Self-pay | Admitting: Family

## 2012-01-12 ENCOUNTER — Ambulatory Visit (INDEPENDENT_AMBULATORY_CARE_PROVIDER_SITE_OTHER)
Admission: RE | Admit: 2012-01-12 | Discharge: 2012-01-12 | Disposition: A | Discharge status: Home / Self Care | Payer: 59 | Source: Ambulatory Visit | Attending: Family | Admitting: Family

## 2012-01-12 VITALS — BP 120/72 | HR 86 | Temp 97.7°F | Resp 16 | Ht 66.0 in | Wt 214.1 lb

## 2012-01-12 DIAGNOSIS — Z0110 Encounter for examination of ears and hearing without abnormal findings: Secondary | ICD-10-CM

## 2012-01-12 DIAGNOSIS — D229 Melanocytic nevi, unspecified: Secondary | ICD-10-CM | POA: Insufficient documentation

## 2012-01-12 DIAGNOSIS — M810 Age-related osteoporosis without current pathological fracture: Secondary | ICD-10-CM

## 2012-01-12 DIAGNOSIS — D239 Other benign neoplasm of skin, unspecified: Secondary | ICD-10-CM

## 2012-01-12 DIAGNOSIS — M25561 Pain in right knee: Secondary | ICD-10-CM

## 2012-01-12 DIAGNOSIS — M25569 Pain in unspecified knee: Secondary | ICD-10-CM

## 2012-01-12 DIAGNOSIS — H9190 Unspecified hearing loss, unspecified ear: Secondary | ICD-10-CM | POA: Insufficient documentation

## 2012-01-12 DIAGNOSIS — Z0000 Encounter for general adult medical examination without abnormal findings: Secondary | ICD-10-CM

## 2012-01-12 DIAGNOSIS — N912 Amenorrhea, unspecified: Secondary | ICD-10-CM | POA: Insufficient documentation

## 2012-01-12 LAB — LIPID PANEL
HDL: 58 mg/dL (ref >39)
LDL Cholesterol: 233 mg/dL — ABNORMAL HIGH (ref 0–99)
Total CHOL/HDL Ratio: 5.6 Ratio

## 2012-01-12 LAB — CBC WITH DIFFERENTIAL/PLATELET
Basophils Absolute: 0 10*3/uL (ref 0.0–0.1)
HCT: 45.6 % (ref 36.0–46.0)
Lymphocytes Relative: 39 % (ref 12–46)
Lymphs Abs: 2.7 10*3/uL (ref 0.7–4.0)
Neutro Abs: 3.5 10*3/uL (ref 1.7–7.7)
Platelets: 337 10*3/uL (ref 150–400)
RBC: 5.12 MIL/uL — ABNORMAL HIGH (ref 3.87–5.11)
RDW: 13.9 % (ref 11.5–15.5)
WBC: 6.9 10*3/uL (ref 4.0–10.5)

## 2012-01-12 LAB — BASIC METABOLIC PANEL WITH GFR
Creat: 0.69 mg/dL (ref 0.50–1.10)
Glucose, Bld: 90 mg/dL (ref 70–99)
Sodium: 141 mEq/L (ref 135–145)

## 2012-01-12 LAB — HEPATIC FUNCTION PANEL
Albumin: 4.8 g/dL (ref 3.5–5.2)
Alkaline Phosphatase: 75 U/L (ref 39–117)
Bilirubin, Direct: 0.1 mg/dL (ref 0.0–0.3)
Indirect Bilirubin: 0.7 mg/dL (ref 0.0–0.9)
Total Bilirubin: 0.8 mg/dL (ref 0.3–1.2)
Total Protein: 7.2 g/dL (ref 6.0–8.3)

## 2012-01-12 LAB — FOLLICLE STIMULATING HORMONE: FSH: 43.6 m[IU]/mL

## 2012-01-12 LAB — LUTEINIZING HORMONE: LH: 26.1 m[IU]/mL

## 2012-01-12 NOTE — Assessment & Plan Note (Signed)
Hearing screen performed today in the office was normal.  I offered to send her for a more thorough formal hearing test with Audiology but she declines at this time.

## 2012-01-12 NOTE — Assessment & Plan Note (Signed)
Pt will see Ortho this week for evaluation.

## 2012-01-12 NOTE — Assessment & Plan Note (Signed)
Pt is requesting lab work to see if she is menopausal.  If so, she plans to bring to her GYN so that she can have her mirena removed.

## 2012-01-12 NOTE — Assessment & Plan Note (Addendum)
Immunizations reviewed and up to date. Pt has colo scheduled.  Schedule dexa. Obtain fasting labs.  EKG today.

## 2012-01-12 NOTE — Progress Notes (Signed)
Subjective:    Patient ID: Christine Rodgers, female    DOB: February 11, 1961, 51 y.o.   MRN: [id number]  HPI  [name] is a 51 yr old female who presents today for her annual exam.    Preventative-  Pt here for fasting physical. Pt up to date with tetanus. Has colonoscopy scheduled next month. Never had DEXA. Last EKG 04/2010, mammogram 01/2011 and thinks last pap smear was 01/2011. She sees Christine Rodgers- CNM. Unable to exercise due to knee pain.  Diet is healthy.   R knee pain-  Pt made apt with orth- Dr. Wyline Mood (8/1)  Reports that she has been limping.  Reports that the knee is "popping."    Review of Systems  Constitutional: Negative for unexpected weight change.  HENT:       Reports that she has issues hearing with background noise.  Has recently worsened.  Eyes:       Wears glasses  Respiratory: Negative for shortness of breath.   Cardiovascular: Negative for chest pain.  Gastrointestinal: Negative for nausea, vomiting, diarrhea and constipation.  Genitourinary: Positive for menstrual problem.       Reports no period since christmas.   She has a [employer] fast streamer.  +hot flashes.    Musculoskeletal: Positive for arthralgias.  Skin:       Eczema right elbow- using hydrocortisone.  Neurological: Negative for headaches.  Hematological: Negative for adenopathy.  Psychiatric/Behavioral:       Some stress re: father's passing       Past Medical History  Diagnosis Date  . Hyperlipidemia   . History of chicken pox   . Eczema     History   Social History  . Marital Status: Divorced    Spouse Name: N/A    Number of Children: 3  . Years of Education: N/A   Occupational History  . 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 Smoking status: Former Games developer  . Smokeless tobacco: Never Used  . Alcohol Use: 4.2 oz/week    7 Glasses of wine per week  . Drug Use: Not on file  . Sexually Active: Not on file   Other Topics Concern  . Not on file   Social  History Narrative   Regular exercise:  NoCaffeine use:  2 mugs of coffee daily    Past Surgical History  Procedure Date  . Bunionectomy 2008    right foot    Family History  Problem Relation Age of Onset  . Hyperlipidemia Mother   . Hyperlipidemia Father     Deceased 16- sepsis/?pneumonia  . Arrhythmia Father   . Ulcerative colitis Daughter   . Diabetes Maternal Grandmother   . Diabetes Maternal Grandfather   . Diabetes Paternal Grandmother   . Stroke Paternal Grandmother   . Stroke Paternal Grandfather     Allergies  Allergen Reactions  . Aspirin     REACTION: severe nausea  . Codeine     REACTION: severe nausea  . Doxycycline Hyclate     REACTION: hives, throat swelling  . Penicillins     REACTION: passed out  . Sulfonamide Derivatives     REACTION: diarrhea, vomitting  . Levaquin (Levofloxacin In D5w) Rash    Current Outpatient Prescriptions on File Prior to Visit  Medication Sig Dispense Refill  . albuterol (PROVENTIL HFA;VENTOLIN HFA) 108 (90 BASE) MCG/ACT inhaler Inhale 2 puffs into the lungs every 6 (six) hours as needed for wheezing.  1 Inhaler  0  . diclofenac sodium (VOLTAREN) 1 % GEL Apply 1 application topically 4 (four) times daily. 4-5 times daily as needed.  100 g  1  . levonorgestrel (MIRENA) 20 MCG/24HR IUD 1 each by Intrauterine route once.          BP 120/72  Pulse 86  Temp 97.7 F (36.5 C) (Oral)  Resp 16  Ht 5\' 6"  (1.676 m)  Wt 214 lb 1.3 oz (97.106 kg)  BMI 34.55 kg/m2  SpO2 97%    Objective:   Physical Exam  Physical Exam  Constitutional: She is oriented to person, place, and time. She appears well-developed and well-nourished. No distress.  HENT:  Head: Normocephalic and atraumatic.  Right Ear: Tympanic membrane and ear canal normal.  Left Ear: Tympanic membrane and ear canal normal.  Mouth/Throat: Oropharynx is clear and moist.  Eyes: Pupils are equal, round, and reactive to light. No scleral icterus.  Neck: Normal range of  motion. No thyromegaly present.  Cardiovascular: Normal rate and regular rhythm.   No murmur heard. Pulmonary/Chest: Effort normal and breath sounds normal. No respiratory distress. He has no wheezes. She has no rales. She exhibits no tenderness.  Abdominal: Soft. Bowel sounds are normal. He exhibits no distension and no mass. There is no tenderness. There is no rebound and no guarding.  Musculoskeletal: She exhibits no edema.  Lymphadenopathy:    She has no cervical adenopathy.  Neurological: She is alert and oriented to person, place, and time. She exhibits normal muscle tone. Coordination normal.  Skin: Skin is warm and dry. Hyperpigmented mole noted on left upper back. Psychiatric: She has a normal mood and affect. Her behavior is normal. Judgment and thought content normal.  Breasts: Examined lying Right: Without masses, retractions, discharge or axillary adenopathy.  Left: Without masses, retractions, discharge or axillary adenopathy.  Pelvic- deferred to GYN          Assessment & Plan:        Assessment & Plan:  EKG- NSR

## 2012-01-12 NOTE — Patient Instructions (Addendum)
Please schedule an appointment for biopsy of the mole on your back. Complete lab work prior to leaving today. Schedule mammogram/pap smear with GYN. Follow up as needed.

## 2012-01-12 NOTE — Assessment & Plan Note (Signed)
Pt will schedule a shave biopsy to further evaluate.

## 2012-01-13 LAB — URINALYSIS, MICROSCOPIC ONLY
Bacteria, UA: NONE SEEN
Crystals: NONE SEEN

## 2012-01-13 LAB — URINALYSIS, ROUTINE W REFLEX MICROSCOPIC
Leukocytes, UA: NEGATIVE
Nitrite: NEGATIVE
Specific Gravity, Urine: 1.027 (ref 1.005–1.030)
Urobilinogen, UA: 0.2 mg/dL (ref 0.0–1.0)

## 2012-01-14 ENCOUNTER — Encounter: Payer: Self-pay | Admitting: Family

## 2012-01-14 ENCOUNTER — Other Ambulatory Visit: Payer: Self-pay | Admitting: Family

## 2012-01-14 NOTE — Telephone Encounter (Signed)
Pls call pt and let her know that her lab work is showing menopause.  We have mailed copies for her to bring to GYN.  Also, cholesterol is very high- total cholesterol 326.  It was over 300 last year as well.  I would recommend that she start a statin once daily to lower her cholesterol and decrease risk of heart attack and stroke.  I would also recommend that she add a baby aspirin 81 mg once daily.  She should follow up for flp/lft in 6 weeks.

## 2012-01-15 NOTE — Telephone Encounter (Signed)
Notified pt and she states she will not take cholesterol medication due to complications from that class of medication. Pt reports that she will start working on diet and exercise again and has already started aspirin daily.

## 2012-01-29 ENCOUNTER — Encounter: Payer: Self-pay | Admitting: Gastroenterology

## 2012-01-29 ENCOUNTER — Ambulatory Visit (AMBULATORY_SURGERY_CENTER): Payer: 59 | Admitting: *Deleted

## 2012-01-29 VITALS — Ht 67.0 in | Wt 217.4 lb

## 2012-01-29 DIAGNOSIS — Z1211 Encounter for screening for malignant neoplasm of colon: Secondary | ICD-10-CM

## 2012-01-29 MED ORDER — MOVIPREP 100 G PO SOLR: ORAL | Status: DC

## 2012-02-12 ENCOUNTER — Encounter: Payer: Self-pay | Admitting: Gastroenterology

## 2012-02-12 ENCOUNTER — Ambulatory Visit (AMBULATORY_SURGERY_CENTER): Payer: 59 | Admitting: Gastroenterology

## 2012-02-12 VITALS — BP 133/84 | HR 84 | Temp 96.4°F | Resp 23 | Ht 67.0 in | Wt 217.0 lb

## 2012-02-12 DIAGNOSIS — Z1211 Encounter for screening for malignant neoplasm of colon: Secondary | ICD-10-CM

## 2012-02-12 MED ORDER — SODIUM CHLORIDE 0.9 % IV SOLN: 500.0000 mL | INTRAVENOUS | Status: DC

## 2012-02-12 NOTE — Progress Notes (Signed)
Patient did not experience any of the following events: a burn prior to discharge; a fall within the facility; wrong site/side/patient/procedure/implant event; or a hospital transfer or hospital admission upon discharge from the facility. (G8907) Patient did not have preoperative order for IV antibiotic SSI prophylaxis. (G8918)  

## 2012-02-12 NOTE — Op Note (Signed)
[facility] [address] [place], [address]   COLONOSCOPY PROCEDURE REPORT  PATIENT: Christine Rodgers, Christine Rodgers  MR#: [id number] BIRTHDATE: February 03, 1961 , 51  yrs. old GENDER: Female ENDOSCOPIST: Meryl Dare, MD, [facility] REFERRED [id number] Peggyann Juba, FNP PROCEDURE DATE:  02/12/2012 PROCEDURE:   Colonoscopy, diagnostic ASA CLASS:   Class II INDICATIONS: average risk screening. MEDICATIONS: MAC sedation, administered by CRNA and propofol (Diprivan) 100mg  IV DESCRIPTION OF PROCEDURE:   After the risks benefits and alternatives of the procedure were thoroughly explained, informed consent was obtained.  A digital rectal exam revealed external hemorrhoids.   The LB [id number]-[id number]  endoscope was introduced through the anus and advanced to the cecum, which was identified by both the appendix and ileocecal valve. No adverse events experienced.   The quality of the prep was excellent, using MoviPrep  The instrument was then slowly withdrawn as the colon was fully examined.   COLON FINDINGS: Mild diverticulosis was noted in the descending colon and sigmoid colon. The colon mucosa was otherwise normal. Retroflexed views revealed small internal hemorrhoids. The time to cecum=1 minutes 00 seconds.  Withdrawal time=9 minutes 40 seconds. The scope was withdrawn and the procedure completed. COMPLICATIONS: There were no complications.  ENDOSCOPIC IMPRESSION: 1.   Mild diverticulosis was noted in the descending colon and sigmoid colon 2.   Small internal and external hemorrhoids  RECOMMENDATIONS: 1.  High fiber diet with liberal fluid intake. 2.  Routine screening colonoscopyin 10 years   eSigned:  Meryl Dare, MD, [facility] 02/12/2012 9:46 AM

## 2012-02-12 NOTE — Progress Notes (Signed)
Propofol per m [employer]. All meds titrated per crna during procedure. See scanned intra procedure report. ewm

## 2012-02-12 NOTE — Patient Instructions (Addendum)
YOU HAD AN ENDOSCOPIC PROCEDURE TODAY AT THE [facility]: Refer to the procedure report that was given to you for any specific questions about what was found during the examination.  If the procedure report does not answer your questions, please call your gastroenterologist to clarify.  If you requested that your care partner not be given the details of your procedure findings, then the procedure report has been included in a sealed envelope for you to review at your convenience later.  YOU SHOULD EXPECT: Some feelings of bloating in the abdomen. Passage of more gas than usual.  Walking can help get rid of the air that was put into your GI tract during the procedure and reduce the bloating. If you had a lower endoscopy (such as a colonoscopy or flexible sigmoidoscopy) you may notice spotting of blood in your stool or on the toilet paper. If you underwent a bowel prep for your procedure, then you may not have a normal bowel movement for a few days.  DIET: Your first meal following the procedure should be a light meal and then it is ok to progress to your normal diet.  A half-sandwich or bowl of soup is an example of a good first meal.  Heavy or fried foods are harder to digest and may make you feel nauseous or bloated.  Likewise meals heavy in dairy and vegetables can cause extra gas to form and this can also increase the bloating.  Drink plenty of fluids but you should avoid alcoholic beverages for 24 hours.  ACTIVITY: Your care partner should take you home directly after the procedure.  You should plan to take it easy, moving slowly for the rest of the day.  You can resume normal activity the day after the procedure however you should NOT DRIVE or use heavy machinery for 24 hours (because of the sedation medicines used during the test).    SYMPTOMS TO REPORT IMMEDIATELY: A gastroenterologist can be reached at any hour.  During normal business hours, 8:30 AM to 5:00 PM Monday through Friday,  call [phone].  After hours and on weekends, please call the GI answering service at [phone] who will take a message and have the physician on call contact you.   Following lower endoscopy (colonoscopy or flexible sigmoidoscopy):  Excessive amounts of blood in the stool  Significant tenderness or worsening of abdominal pains  Swelling of the abdomen that is new, acute  Fever of 100F or higher   FOLLOW UP: If any biopsies were taken you will be contacted by phone or by letter within the next 1-3 weeks.  Call your gastroenterologist if you have not heard about the biopsies in 3 weeks.  Our staff will call the home number listed on your records the next business day following your procedure to check on you and address any questions or concerns that you may have at that time regarding the information given to you following your procedure. This is a courtesy call and so if there is no answer at the home number and we have not heard from you through the emergency physician on call, we will assume that you have returned to your regular daily activities without incident.  SIGNATURES/CONFIDENTIALITY: You and/or your care partner have signed paperwork which will be entered into your electronic medical record.  These signatures attest to the fact that that the information above on your After Visit Summary has been reviewed and is understood.  Full responsibility of the confidentiality of   this discharge information lies with you and/or your care-partner.   Resume medications.Information given on diverticulosis,hemorrhoids and high fiber diet with discharge instructions. 

## 2012-02-13 ENCOUNTER — Telehealth: Payer: Self-pay | Admitting: *Deleted

## 2012-02-13 NOTE — Telephone Encounter (Signed)
No answer at # given.  Left message on voicemail.  

## 2012-02-18 ENCOUNTER — Other Ambulatory Visit (HOSPITAL_COMMUNITY): Payer: Self-pay | Admitting: Certified Nurse Midwife

## 2012-02-18 DIAGNOSIS — Z1231 Encounter for screening mammogram for malignant neoplasm of breast: Secondary | ICD-10-CM

## 2012-03-04 ENCOUNTER — Ambulatory Visit (HOSPITAL_COMMUNITY)
Admission: RE | Admit: 2012-03-04 | Discharge: 2012-03-04 | Disposition: A | Discharge status: Home / Self Care | Payer: 59 | Source: Ambulatory Visit | Attending: Certified Nurse Midwife | Admitting: Certified Nurse Midwife

## 2012-03-04 DIAGNOSIS — Z1231 Encounter for screening mammogram for malignant neoplasm of breast: Secondary | ICD-10-CM | POA: Insufficient documentation

## 2012-04-14 ENCOUNTER — Encounter: Payer: Self-pay | Admitting: Family

## 2012-04-20 ENCOUNTER — Encounter: Payer: Self-pay | Admitting: Family

## 2012-04-20 ENCOUNTER — Other Ambulatory Visit: Payer: Self-pay | Admitting: Family

## 2012-04-20 MED ORDER — CALCIUM CARBONATE-VITAMIN D 600-400 MG-UNIT PO TABS: 1.0000 | ORAL_TABLET | Freq: Two times a day (BID) | ORAL | Status: DC

## 2012-08-27 ENCOUNTER — Encounter: Payer: Self-pay | Admitting: Family

## 2012-08-27 ENCOUNTER — Ambulatory Visit (INDEPENDENT_AMBULATORY_CARE_PROVIDER_SITE_OTHER): Payer: 59 | Admitting: Family

## 2012-08-27 VITALS — BP 110/80 | HR 100 | Temp 98.3°F | Resp 18 | Wt 224.0 lb

## 2012-08-27 DIAGNOSIS — J322 Chronic ethmoidal sinusitis: Secondary | ICD-10-CM | POA: Insufficient documentation

## 2012-08-27 MED ORDER — CEFUROXIME AXETIL 500 MG PO TABS: 500.0000 mg | ORAL_TABLET | Freq: Two times a day (BID) | ORAL | Status: DC

## 2012-08-27 MED ORDER — BENZONATATE 100 MG PO CAPS: 100.0000 mg | ORAL_CAPSULE | Freq: Three times a day (TID) | ORAL | Status: DC | PRN

## 2012-08-27 NOTE — Patient Instructions (Addendum)
Please call if symptoms worsen or if no improvement in 2-3 days.  

## 2012-08-27 NOTE — Assessment & Plan Note (Signed)
Will rx with ceftin (she tolerated in the past).  She is instructed to call if symptoms worsen or if not improved in 2-3 days. Add tessalon prn cough.

## 2012-08-27 NOTE — Progress Notes (Signed)
Subjective:    Patient ID: Christine Rodgers, female    DOB: 1960-08-03, 52 y.o.   MRN: [id number]  HPI  Christine Rodgers is a 52 yr old female who presents today with chief complaint of nasal congestion. Started off like a "normal cold." She reports associated facial pain, right ear pain, cough and fever this AM (101) for which she took tylenol.  Has had trouble sleeping due to cough.  She reports that symptoms have been present x 1 week.    Review of Systems    see HPI  Past Medical History  Diagnosis Date  . Hyperlipidemia   . History of chicken pox   . Eczema   . Arthritis     knees  . Asthma   . Allergy     History   Social History  . Marital Status: Married    Spouse Name: N/A    Number of Children: 3  . Years of Education: N/A   Occupational History  . 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 Smoking status: Former Games developer  . Smokeless tobacco: Never Used  . Alcohol Use: 4.2 oz/week    7 Glasses of wine per week  . Drug Use: No  . Sexually Active: Not on file   Other Topics Concern  . Not on file   Social History Narrative   Regular exercise:  No   Caffeine use:  2 mugs of coffee daily                Past Surgical History  Procedure Laterality Date  . Bunionectomy  2008    right foot    Family History  Problem Relation Age of Onset  . Hyperlipidemia Mother   . Hyperlipidemia Father     Deceased 64- sepsis/?pneumonia  . Arrhythmia Father   . Ulcerative colitis Daughter   . Diabetes Maternal Grandmother   . Diabetes Maternal Grandfather   . Diabetes Paternal Grandmother   . Stroke Paternal Grandmother   . Stroke Paternal Grandfather   . Colon cancer Neg Hx   . Stomach cancer Neg Hx     Allergies  Allergen Reactions  . Aspirin     REACTION: severe nausea  . Codeine     REACTION: severe nausea  . Doxycycline Hyclate     REACTION: hives, throat swelling  . Penicillins     REACTION: passed out  .  Sulfonamide Derivatives     REACTION: diarrhea, vomitting  . Levaquin (Levofloxacin In D5w) Rash    Current Outpatient Prescriptions on File Prior to Visit  Medication Sig Dispense Refill  . albuterol (PROVENTIL HFA;VENTOLIN HFA) 108 (90 BASE) MCG/ACT inhaler Inhale 2 puffs into the lungs every 6 (six) hours as needed for wheezing.  1 Inhaler  0  . aspirin EC 81 MG tablet Take 81 mg by mouth daily.      . diclofenac sodium (VOLTAREN) 1 % GEL Apply 1 application topically 4 (four) times daily. 4-5 times daily as needed.  100 g  1  . levonorgestrel (MIRENA) 20 MCG/24HR IUD 1 each by Intrauterine route once.        . Multiple Vitamin (MULTIVITAMIN) tablet Take 1 tablet by mouth daily.      . Omega-3 Fatty Acids (FISH OIL PO) Take by mouth.      . psyllium (METAMUCIL) 58.6 % powder Take 1 packet by mouth daily.      . Calcium Carbonate-Vitamin D (CALTRATE 600+D) 600-400  MG-UNIT per tablet Take 1 tablet by mouth 2 (two) times daily.       No current facility-administered medications on file prior to visit.    BP 110/80  Pulse 100  Temp(Src) 98.3 F (36.8 C) (Oral)  Resp 18  Wt 224 lb 0.6 oz (101.624 kg)  BMI 35.08 kg/m2  SpO2 99%  LMP 05/17/2011    Objective:   Physical Exam  Constitutional: She is oriented to person, place, and time. She appears well-developed and well-nourished. No distress.  HENT:  Head: Normocephalic and atraumatic.  R frontal and maxillary sinus tenderness to palpation  Mild post orophayrngeal erythema  Cardiovascular: Normal rate and regular rhythm.   No murmur heard. Pulmonary/Chest: Effort normal and breath sounds normal. No respiratory distress. She has no wheezes. She has no rales. She exhibits no tenderness.  Lymphadenopathy:    She has no cervical adenopathy.  Neurological: She is alert and oriented to person, place, and time.  Skin: Skin is warm and dry.  Psychiatric: She has a normal mood and affect. Her behavior is normal. Judgment and thought  content normal.          Assessment & Plan:

## 2012-08-30 ENCOUNTER — Telehealth: Payer: Self-pay | Admitting: *Deleted

## 2012-08-30 ENCOUNTER — Ambulatory Visit (HOSPITAL_BASED_OUTPATIENT_CLINIC_OR_DEPARTMENT_OTHER)
Admission: RE | Admit: 2012-08-30 | Discharge: 2012-08-30 | Disposition: A | Discharge status: Home / Self Care | Payer: 59 | Source: Ambulatory Visit | Attending: Family | Admitting: Family

## 2012-08-30 DIAGNOSIS — R509 Fever, unspecified: Secondary | ICD-10-CM

## 2012-08-30 DIAGNOSIS — R059 Cough, unspecified: Secondary | ICD-10-CM | POA: Insufficient documentation

## 2012-08-30 NOTE — Telephone Encounter (Signed)
The 2:30 slot was taken. Ok to try and pt in tomorrow?

## 2012-08-30 NOTE — Telephone Encounter (Signed)
Please call pt and see if she can be seen this afternoon at 2:30? thanks

## 2012-08-30 NOTE — Telephone Encounter (Signed)
Spoke to pt.  Reports Temp 100.2-100.5.  Reports that the cough is much better.  Sleeping better. Sinus pain/ear pain has improved.  I have asked pt to come to med center now for CXR. She verbalizes understanding.

## 2012-08-30 NOTE — Telephone Encounter (Signed)
Pt was seen in office on Friday, px Ceftin & Tessalon for Sinusitis & cough; reports she has taken medicine as prescribed but is now experiencing severe body aches, described as "feels like has been hit by a truck", with chills & sweats and fever has spiked up to 102 degrees over the weekend, but has lowered to around 100 this morning with Tylenol/SLS Please advise.

## 2012-08-30 NOTE — Telephone Encounter (Signed)
Reviewed CXR- neg.  Plan continue ceftin.  Reviewed with pt. Asked her to contact me if her symptoms worsen or if not feeling better in 2-3 days. She verbalizes understanding.

## 2013-01-03 ENCOUNTER — Ambulatory Visit (INDEPENDENT_AMBULATORY_CARE_PROVIDER_SITE_OTHER): Payer: 59 | Admitting: Family

## 2013-01-03 ENCOUNTER — Other Ambulatory Visit: Payer: Self-pay | Admitting: Family

## 2013-01-03 ENCOUNTER — Encounter: Payer: Self-pay | Admitting: Family

## 2013-01-03 VITALS — BP 120/92 | HR 78 | Temp 98.4°F | Resp 16 | Wt 230.0 lb

## 2013-01-03 DIAGNOSIS — R002 Palpitations: Secondary | ICD-10-CM

## 2013-01-03 DIAGNOSIS — R0789 Other chest pain: Secondary | ICD-10-CM

## 2013-01-03 LAB — CBC WITH DIFFERENTIAL/PLATELET
Basophils Relative: 0 % (ref 0–1)
HCT: 45.5 % (ref 36.0–46.0)
Hemoglobin: 15.6 g/dL — ABNORMAL HIGH (ref 12.0–15.0)
Lymphocytes Relative: 31 % (ref 12–46)
MCHC: 34.3 g/dL (ref 30.0–36.0)
Monocytes Relative: 7 % (ref 3–12)
Neutro Abs: 5.8 10*3/uL (ref 1.7–7.7)
Neutrophils Relative %: 58 % (ref 43–77)
RBC: 5.21 MIL/uL — ABNORMAL HIGH (ref 3.87–5.11)
WBC: 10 10*3/uL (ref 4.0–10.5)

## 2013-01-03 NOTE — Patient Instructions (Addendum)
Complete your lab work prior to leaving. You will be contacted about your cardiac monitor. Follow up in 1 month. Go to ER if you develop chest pain or heart rate at rest >110.

## 2013-01-03 NOTE — Progress Notes (Signed)
Subjective:    Patient ID: Christine Rodgers, female    DOB: 08-06-1960, 52 y.o.   MRN: [id number]  HPI  Christine Rodgers is a 52 yr old female who presents today with chief complaint of palpitations. Started 2 days ago.  Symptoms continued on Sunday. Today she reports that symptoms continue.  Notes that she has some mild nausea.  Mild lip tingling.  Symptoms are intermittent. Reports tremendous amount of stress.    Was told by her GYN that she was in menopause but "still making eggs."  She reports that she did recently get a menstrual period.    Review of Systems See HPI  Past Medical History  Diagnosis Date  . Hyperlipidemia   . History of chicken pox   . Eczema   . Arthritis     knees  . Asthma   . Allergy     History   Social History  . Marital Status: Married    Spouse Name: N/A    Number of Children: 3  . Years of Education: N/A   Occupational History  . 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 Smoking status: Former Games developer  . Smokeless tobacco: Never Used  . Alcohol Use: 4.2 oz/week    7 Glasses of wine per week  . Drug Use: No  . Sexually Active: Not on file   Other Topics Concern  . Not on file   Social History Narrative   Regular exercise:  No   Caffeine use:  2 mugs of coffee daily                Past Surgical History  Procedure Laterality Date  . Bunionectomy  2008    right foot    Family History  Problem Relation Age of Onset  . Hyperlipidemia Mother   . Hyperlipidemia Father     Deceased 20- sepsis/?pneumonia  . Arrhythmia Father   . Ulcerative colitis Daughter   . Diabetes Maternal Grandmother   . Diabetes Maternal Grandfather   . Diabetes Paternal Grandmother   . Stroke Paternal Grandmother   . Stroke Paternal Grandfather   . Colon cancer Neg Hx   . Stomach cancer Neg Hx     Allergies  Allergen Reactions  . Aspirin     REACTION: severe nausea  . Codeine     REACTION: severe nausea  .  Doxycycline Hyclate     REACTION: hives, throat swelling  . Penicillins     REACTION: passed out  . Sulfonamide Derivatives     REACTION: diarrhea, vomitting  . Levaquin (Levofloxacin In D5w) Rash    Current Outpatient Prescriptions on File Prior to Visit  Medication Sig Dispense Refill  . albuterol (PROVENTIL HFA;VENTOLIN HFA) 108 (90 BASE) MCG/ACT inhaler Inhale 2 puffs into the lungs every 6 (six) hours as needed for wheezing.  1 Inhaler  0  . aspirin EC 81 MG tablet Take 81 mg by mouth daily.      . cefUROXime (CEFTIN) 500 MG tablet Take 1 tablet (500 mg total) by mouth 2 (two) times daily.  20 tablet  0  . diclofenac sodium (VOLTAREN) 1 % GEL Apply 1 application topically 4 (four) times daily. 4-5 times daily as needed.  100 g  1  . levonorgestrel (MIRENA) 20 MCG/24HR IUD 1 each by Intrauterine route once.        . Multiple Vitamin (MULTIVITAMIN) tablet Take 1 tablet by mouth daily.      Marland Kitchen  Omega-3 Fatty Acids (FISH OIL PO) Take by mouth.      . psyllium (METAMUCIL) 58.6 % powder Take 1 packet by mouth daily.      . Calcium Carbonate-Vitamin D (CALTRATE 600+D) 600-400 MG-UNIT per tablet Take 1 tablet by mouth 2 (two) times daily.       No current facility-administered medications on file prior to visit.    BP 120/92  Pulse 78  Temp(Src) 98.4 F (36.9 C) (Oral)  Resp 16  Wt 230 lb (104.327 kg)  BMI 36.01 kg/m2  SpO2 99%  LMP 01/03/2013       Objective:   Physical Exam  Constitutional: She is oriented to person, place, and time. She appears well-developed and well-nourished. No distress.  Cardiovascular: Normal rate and regular rhythm.   No murmur heard. Pulmonary/Chest: Effort normal and breath sounds normal. No respiratory distress. She has no wheezes. She has no rales. She exhibits no tenderness.  Neurological: She is alert and oriented to person, place, and time.  Skin: Skin is warm and dry.  Psychiatric: She has a normal mood and affect. Her behavior is normal.  Judgment and thought content normal.          Assessment & Plan:

## 2013-01-04 LAB — BASIC METABOLIC PANEL
CO2: 28 mEq/L (ref 19–32)
Chloride: 102 mEq/L (ref 96–112)
Potassium: 4.6 mEq/L (ref 3.5–5.3)
Sodium: 138 mEq/L (ref 135–145)

## 2013-01-04 LAB — T3, FREE: T3, Free: 3 pg/mL (ref 2.3–4.2)

## 2013-01-04 LAB — FERRITIN: Ferritin: 275 ng/mL (ref 10–291)

## 2013-01-04 LAB — T4, FREE: Free T4: 1.01 ng/dL (ref 0.80–1.80)

## 2013-01-05 ENCOUNTER — Encounter: Payer: Self-pay | Admitting: Family

## 2013-01-05 ENCOUNTER — Telehealth: Payer: Self-pay | Admitting: Cardiology

## 2013-01-05 DIAGNOSIS — R002 Palpitations: Secondary | ICD-10-CM | POA: Insufficient documentation

## 2013-01-05 NOTE — Telephone Encounter (Signed)
Pt called today 7/23, I call pt back but was unable to reach her.  Message "we are unable to reach this subscriber".  Unable to leave message.    OB

## 2013-01-05 NOTE — Assessment & Plan Note (Signed)
EKG is performed and notes NSR with occasional ectopic ventricular beat.  CBC/BMET/TSH- unremarkable except for mild elevation of hemoglobin with normal ferritin level.  Will obtain holter monitor.  Pt is instructed to go to the ED if severe or worsening CP occurs. She verbalizes understanding.

## 2013-01-06 ENCOUNTER — Encounter (INDEPENDENT_AMBULATORY_CARE_PROVIDER_SITE_OTHER): Payer: 59

## 2013-01-06 ENCOUNTER — Encounter: Payer: Self-pay | Admitting: *Deleted

## 2013-01-06 DIAGNOSIS — R002 Palpitations: Secondary | ICD-10-CM

## 2013-01-06 NOTE — Telephone Encounter (Signed)
Monitor  7/24 done.

## 2013-01-06 NOTE — Progress Notes (Signed)
Patient ID: Christine Rodgers, female   DOB: Nov 22, 1960, 52 y.o.   MRN: [id number] Evo 48 Hour Holter Monitor applied to patient.

## 2013-01-18 ENCOUNTER — Telehealth: Payer: Self-pay | Admitting: Cardiology

## 2013-01-18 NOTE — Telephone Encounter (Signed)
Per Shelly--monitor given to Dr Leida Lauth) to read.

## 2013-01-18 NOTE — Telephone Encounter (Signed)
In Basket message to Clarissa to get results.

## 2013-01-18 NOTE — Telephone Encounter (Signed)
Monitor ordered by Dr Sandford Craze, read today by Dr Ladona Ridgel. I have returned signed monitor report to [facility] to have report scanned in to [employer] or tomorrow.  Per Gina--pt should follow up with Dr Peggyann Juba for report. LMTCB for pt to contact Dr Peggyann Juba for results.

## 2013-01-18 NOTE — Telephone Encounter (Signed)
New  Prob  Pt wants to know the results of the heart monitor that she wore. She said she turned it in about a week ago.

## 2013-01-27 NOTE — Telephone Encounter (Signed)
New Problem   Pt wants to know about her heart monitor results//

## 2013-01-27 NOTE — Telephone Encounter (Signed)
See my chart message

## 2013-01-28 ENCOUNTER — Encounter: Payer: Self-pay | Admitting: Family

## 2013-01-28 MED ORDER — METOPROLOL SUCCINATE ER 25 MG PO TB24: 25.0000 mg | ORAL_TABLET | Freq: Every day | ORAL | Status: DC

## 2013-04-21 ENCOUNTER — Other Ambulatory Visit: Payer: Self-pay

## 2013-05-27 ENCOUNTER — Encounter: Payer: Self-pay | Admitting: Physician Assistant

## 2013-05-27 ENCOUNTER — Ambulatory Visit (INDEPENDENT_AMBULATORY_CARE_PROVIDER_SITE_OTHER): Payer: 59 | Admitting: Physician Assistant

## 2013-05-27 VITALS — BP 142/86 | HR 86 | Temp 98.6°F | Wt 224.5 lb

## 2013-05-27 DIAGNOSIS — J329 Chronic sinusitis, unspecified: Secondary | ICD-10-CM | POA: Insufficient documentation

## 2013-05-27 MED ORDER — FLUTICASONE PROPIONATE 50 MCG/ACT NA SUSP: 2.0000 | Freq: Every day | NASAL | Status: DC

## 2013-05-27 MED ORDER — CEFUROXIME AXETIL 250 MG PO TABS: 250.0000 mg | ORAL_TABLET | Freq: Two times a day (BID) | ORAL | Status: DC

## 2013-05-27 NOTE — Patient Instructions (Signed)
Please increase fluid intake.  Saline nasal spray and Flonase.  Take antibiotic as prescribed with meals.  Rest.  Nightly zyrtec.  Tylenol for pain.  Please call or return to clinic if symptoms are not improving.

## 2013-05-27 NOTE — Progress Notes (Signed)
Patient ID: Christine Rodgers, female   DOB: [birth year], 52 y.o.   MRN: [id number]  Patient presents to clinic today complaining of one-week of right ear pain.  Patient also endorses right-sided sinus pressure and pain, headache and fever of 100.0.  Patient has tried over-the-counter measures including Sudafed sinus pressure. Patient endorses history of sinusitis and ear infections in the past.  Patient denies cough, chest congestion, shortness of breath or wheezing.  Denies recent travel.    Past Medical History  Diagnosis Date  . Hyperlipidemia   . History of chicken pox   . Eczema   . Arthritis     knees  . Asthma   . Allergy     Current Outpatient Prescriptions on File Prior to Visit  Medication Sig Dispense Refill  . albuterol (PROVENTIL HFA;VENTOLIN HFA) 108 (90 BASE) MCG/ACT inhaler Inhale 2 puffs into the lungs every 6 (six) hours as needed for wheezing.  1 Inhaler  0  . aspirin EC 81 MG tablet Take 81 mg by mouth daily.      . diclofenac sodium (VOLTAREN) 1 % GEL Apply 1 application topically 4 (four) times daily. 4-5 times daily as needed.  100 g  1  . levonorgestrel (MIRENA) 20 MCG/24HR IUD 1 each by Intrauterine route once.        . Multiple Vitamin (MULTIVITAMIN) tablet Take 1 tablet by mouth daily.      . Omega-3 Fatty Acids (FISH OIL PO) Take by mouth.       No current facility-administered medications on file prior to visit.    Allergies  Allergen Reactions  . Aspirin     REACTION: severe nausea  . Codeine     REACTION: severe nausea  . Doxycycline Hyclate     REACTION: hives, throat swelling  . Penicillins     REACTION: passed out  . Sulfonamide Derivatives     REACTION: diarrhea, vomitting  . Levaquin [Levofloxacin In D5w] Rash    Family History  Problem Relation Age of Onset  . Hyperlipidemia Mother   . Hyperlipidemia Father     Deceased 36- sepsis/?pneumonia  . Arrhythmia Father   . Ulcerative colitis Daughter   . Diabetes Maternal Grandmother   .  Diabetes Maternal Grandfather   . Diabetes Paternal Grandmother   . Stroke Paternal Grandmother   . Stroke Paternal Grandfather   . Colon cancer Neg Hx   . Stomach cancer Neg Hx     History   Social History  . Marital Status: Married    Spouse Name: N/A    Number of Children: 3  . Years of Education: N/A   Occupational History  . 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 Smoking status: Former Games developer  . Smokeless tobacco: Never Used  . Alcohol Use: 4.2 oz/week    7 Glasses of wine per week     Comment: occ  . Drug Use: No  . Sexual Activity: None   Other Topics Concern  . None   Social History Narrative   Regular exercise:  No   Caffeine use:  2 mugs of coffee daily               Review of Systems - see history of present illness. All other review of systems are negative.  Filed Vitals:   05/27/13 1025  BP: 142/86  Pulse: 86  Temp: 98.6 F (37 C)   Physical Exam  Vitals reviewed. Constitutional: She is oriented to  person, place, and time and well-developed, well-nourished, and in no distress.  HENT:  Head: Normocephalic and atraumatic.  Right Ear: External ear normal.  Left Ear: External ear normal.  Nose: Nose normal.  Mouth/Throat: Oropharynx is clear and moist. No oropharyngeal exudate.  Left tympanic membrane within normal limits. Right tympanic membrane is dull and bulging.  Tenderness to percussion of right maxillary and frontal sinuses noted on examination.  Eyes: Conjunctivae are normal.  Neck: Neck supple.  Cardiovascular: Normal rate, regular rhythm, normal heart sounds and intact distal pulses.   Pulmonary/Chest: Effort normal and breath sounds normal. No respiratory distress. She has no wheezes. She has no rales. She exhibits no tenderness.  Lymphadenopathy:    She has no cervical adenopathy.  Neurological: She is alert and oriented to person, place, and time.  Skin: Skin is warm and dry. No rash noted.  Psychiatric:  Affect normal.   Assessment/Plan: Sinusitis Given significant allergies to multiple antibiotics, will Rx Ceftin. Patient has tolerated this medication well in the past. Encourage patient to increase fluid intake. Rest. Saline nasal spray. Rx Flonase. Nightly Zyrtec. Humidifier in bedroom. Probiotic. Patient is to call or return to clinic if symptoms not improving.

## 2013-05-27 NOTE — Progress Notes (Signed)
Pre-visit discussion using our clinic review tool. No additional management support is needed unless otherwise documented below in the visit note.  

## 2013-05-27 NOTE — Assessment & Plan Note (Signed)
Given significant allergies to multiple antibiotics, will Rx Ceftin. Patient has tolerated this medication well in the past. Encourage patient to increase fluid intake. Rest. Saline nasal spray. Rx Flonase. Nightly Zyrtec. Humidifier in bedroom. Probiotic. Patient is to call or return to clinic if symptoms not improving.

## 2013-06-21 ENCOUNTER — Other Ambulatory Visit (HOSPITAL_COMMUNITY): Payer: Self-pay | Admitting: Certified Nurse Midwife

## 2013-06-21 DIAGNOSIS — Z1231 Encounter for screening mammogram for malignant neoplasm of breast: Secondary | ICD-10-CM

## 2013-06-30 ENCOUNTER — Ambulatory Visit (HOSPITAL_COMMUNITY)
Admission: RE | Admit: 2013-06-30 | Discharge: 2013-06-30 | Disposition: A | Discharge status: Home / Self Care | Payer: 59 | Source: Ambulatory Visit | Attending: Certified Nurse Midwife | Admitting: Certified Nurse Midwife

## 2013-06-30 DIAGNOSIS — Z1231 Encounter for screening mammogram for malignant neoplasm of breast: Secondary | ICD-10-CM

## 2013-07-01 ENCOUNTER — Other Ambulatory Visit: Payer: Self-pay | Admitting: Certified Nurse Midwife

## 2013-07-01 DIAGNOSIS — R928 Other abnormal and inconclusive findings on diagnostic imaging of breast: Secondary | ICD-10-CM

## 2013-07-12 ENCOUNTER — Ambulatory Visit
Admission: RE | Admit: 2013-07-12 | Discharge: 2013-07-12 | Disposition: A | Discharge status: Home / Self Care | Payer: 59 | Source: Ambulatory Visit | Attending: Certified Nurse Midwife | Admitting: Certified Nurse Midwife

## 2013-07-12 DIAGNOSIS — R928 Other abnormal and inconclusive findings on diagnostic imaging of breast: Secondary | ICD-10-CM

## 2013-10-05 ENCOUNTER — Ambulatory Visit: Payer: 59 | Admitting: Physician Assistant

## 2013-10-05 ENCOUNTER — Telehealth: Payer: Self-pay | Admitting: *Deleted

## 2013-10-05 ENCOUNTER — Encounter: Payer: Self-pay | Admitting: Physician Assistant

## 2013-10-05 ENCOUNTER — Inpatient Hospital Stay (HOSPITAL_COMMUNITY): Admission: RE | Admit: 2013-10-05 | Payer: 59 | Source: Ambulatory Visit

## 2013-10-05 ENCOUNTER — Ambulatory Visit (INDEPENDENT_AMBULATORY_CARE_PROVIDER_SITE_OTHER): Payer: 59 | Admitting: Physician Assistant

## 2013-10-05 ENCOUNTER — Other Ambulatory Visit (HOSPITAL_COMMUNITY): Payer: Self-pay | Admitting: Physician Assistant

## 2013-10-05 VITALS — BP 116/70 | HR 83 | Temp 98.4°F | Resp 16 | Ht 67.0 in | Wt 226.2 lb

## 2013-10-05 DIAGNOSIS — M79609 Pain in unspecified limb: Secondary | ICD-10-CM

## 2013-10-05 DIAGNOSIS — M25569 Pain in unspecified knee: Secondary | ICD-10-CM

## 2013-10-05 DIAGNOSIS — M7989 Other specified soft tissue disorders: Secondary | ICD-10-CM

## 2013-10-05 DIAGNOSIS — M62838 Other muscle spasm: Secondary | ICD-10-CM

## 2013-10-05 DIAGNOSIS — M79661 Pain in right lower leg: Secondary | ICD-10-CM | POA: Insufficient documentation

## 2013-10-05 MED ORDER — METHOCARBAMOL 500 MG PO TABS: 500.0000 mg | ORAL_TABLET | Freq: Three times a day (TID) | ORAL | Status: DC

## 2013-10-05 NOTE — Patient Instructions (Addendum)
Please proceed to [facility] for an Ultrasound of your right leg to rule out blood clot. They are not to let you leave before calling the office at [phone].  I have given them my cell phone number in case study finishes after 7PM.  At any time if you develop SOB or chest pain, please call 911 or proceed to the ER.    Apply Voltaren topically to your lower back.  Alternate tylenol and ibuprofen if needed for pain until I get your ultrasound results.  Take robaxin as directed for muscle spasm.  Apply heating pad to leg for 15 minutes, a few times per day.

## 2013-10-05 NOTE — Progress Notes (Signed)
Pre visit review using our clinic review tool, if applicable. No additional management support is needed unless otherwise documented below in the visit note/SLS  

## 2013-10-05 NOTE — Progress Notes (Signed)
Patient presents to clinic today c/o right leg pain x 2 weeks.  Patient endorses intermittent swelling of right lower extremity.  Denies recent injury or trauma.  Denies numbness, tingling or weakness of RLE.  Does have history of chronic back pain but denies radiculopathy.  Denies hx of DVT, PE or coagulopathy.  Denies recent surgery, prolonged immobilization, or long-distance travel.  Past Medical History  Diagnosis Date  . Hyperlipidemia   . History of chicken pox   . Eczema   . Arthritis     knees  . Asthma   . Allergy     Current Outpatient Prescriptions on File Prior to Visit  Medication Sig Dispense Refill  . albuterol (PROVENTIL HFA;VENTOLIN HFA) 108 (90 BASE) MCG/ACT inhaler Inhale 2 puffs into the lungs every 6 (six) hours as needed for wheezing.  1 Inhaler  0  . aspirin EC 81 MG tablet Take 81 mg by mouth daily as needed.       . diclofenac sodium (VOLTAREN) 1 % GEL Apply 1 application topically 4 (four) times daily. 4-5 times daily as needed.  100 g  1  . fluticasone (FLONASE) 50 MCG/ACT nasal spray Place 2 sprays into both nostrils daily.  16 g  1  . levonorgestrel (MIRENA) 20 MCG/24HR IUD 1 each by Intrauterine route once.        . Multiple Vitamin (MULTIVITAMIN) tablet Take 1 tablet by mouth daily.      . Omega-3 Fatty Acids (FISH OIL PO) Take by mouth daily as needed.        No current facility-administered medications on file prior to visit.    Allergies  Allergen Reactions  . Aspirin     REACTION: severe nausea  . Codeine     REACTION: severe nausea  . Doxycycline Hyclate     REACTION: hives, throat swelling  . Penicillins     REACTION: passed out  . Sulfonamide Derivatives     REACTION: diarrhea, vomitting  . Levaquin [Levofloxacin In D5w] Rash    Family History  Problem Relation Age of Onset  . Hyperlipidemia Mother   . Hyperlipidemia Father     Deceased 89- sepsis/?pneumonia  . Arrhythmia Father   . Ulcerative colitis Daughter   . Diabetes Maternal  Grandmother   . Diabetes Maternal Grandfather   . Diabetes Paternal Grandmother   . Stroke Paternal Grandmother   . Stroke Paternal Grandfather   . Colon cancer Neg Hx   . Stomach cancer Neg Hx     History   Social History  . Marital Status: Married    Spouse Name: N/A    Number of Children: 3  . Years of Education: N/A   Occupational History  . 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 Smoking status: Former Research scientist (life sciences)  . Smokeless tobacco: Never Used  . Alcohol Use: 4.2 oz/week    7 Glasses of wine per week     Comment: occ  . Drug Use: No  . Sexual Activity: None   Other Topics Concern  . None   Social History Narrative   Regular exercise:  No   Caffeine use:  2 mugs of coffee daily               Review of Systems - See HPI.  All other ROS are negative.  BP 116/70  Pulse 83  Temp(Src) 98.4 F (36.9 C) (Oral)  Resp 16  Ht 5\' 7"  (1.702 m)  Wt 226 lb  4 oz (102.626 kg)  BMI 35.43 kg/m2  SpO2 98%  LMP 05/20/2013  Physical Exam  Vitals reviewed. Constitutional: She is oriented to person, place, and time and well-developed, well-nourished, and in no distress.  HENT:  Head: Normocephalic and atraumatic.  Eyes: Conjunctivae are normal.  Neck: Neck supple.  Cardiovascular: Normal rate, regular rhythm, normal heart sounds and intact distal pulses.   Pulmonary/Chest: Effort normal and breath sounds normal. No respiratory distress. She has no wheezes. She has no rales. She exhibits no tenderness.  Musculoskeletal:       Right hip: Normal.       Right knee: Normal.       Right ankle: Normal. No tenderness. Achilles tendon normal.       Cervical back: Normal.       Thoracic back: Normal.       Lumbar back: Normal.       Right upper leg: Normal.       Right lower leg: She exhibits tenderness. She exhibits no bony tenderness, no swelling and no deformity.       Right foot: Normal.  Neurological: She is alert and oriented to person, place,  and time.  Skin: Skin is warm and dry. No rash noted.  Psychiatric: Affect normal.   Assessment/Plan: Right calf pain With + Homan sign but no evidence of significant swelling.  Low concern for DVT but needs to be ruled/out.  Order for [place] placed -- Patient was scheduled for imaging this evening, but refused stating she will wait until tomorrow.  Advised against this due to concerns.  Patient wished to still defer imaging until tomorrow AMA.  Imaging set up.  Discussed alarm signs/symptoms and when to proceed to ER if necessary.  Patient voices understanding.  Apply heating pad to area.  Rx robaxin for muscle spasms.

## 2013-10-05 NOTE — Telephone Encounter (Signed)
Patient scheduled for Venous Doppler to r/o DVT at [facility]; pt declined appointment stating she "wanted morning appt for tomorrow instead", called [facility] [only had 2:30pm for 04.23.15], called [facility] had 1:00pm for 04.23.15], pt declined these again wanting morning appt,; in process of contacting our hospitals again when patient decided that she would take the 2:30 pm appt w/[facility] tomorrow. Called our Imaging dept back and scheduled pt's appointment and gave her new information on AVS. Patient informed, understood & agreed/SLS

## 2013-10-05 NOTE — Assessment & Plan Note (Signed)
With + Homan sign but no evidence of significant swelling.  Low concern for DVT but needs to be ruled/out.  Order for [place] placed -- Patient was scheduled for imaging this evening, but refused stating she will wait until tomorrow.  Advised against this due to concerns.  Patient wished to still defer imaging until tomorrow AMA.  Imaging set up.  Discussed alarm signs/symptoms and when to proceed to ER if necessary.  Patient voices understanding.  Apply heating pad to area.  Rx robaxin for muscle spasms.

## 2013-10-06 ENCOUNTER — Ambulatory Visit (HOSPITAL_BASED_OUTPATIENT_CLINIC_OR_DEPARTMENT_OTHER)
Admission: RE | Admit: 2013-10-06 | Discharge: 2013-10-06 | Disposition: A | Discharge status: Home / Self Care | Payer: 59 | Source: Ambulatory Visit | Attending: Physician Assistant | Admitting: Physician Assistant

## 2013-10-06 ENCOUNTER — Telehealth: Payer: Self-pay | Admitting: *Deleted

## 2013-10-06 DIAGNOSIS — M7989 Other specified soft tissue disorders: Secondary | ICD-10-CM

## 2013-10-06 DIAGNOSIS — M79661 Pain in right lower leg: Secondary | ICD-10-CM

## 2013-10-06 DIAGNOSIS — M79609 Pain in unspecified limb: Secondary | ICD-10-CM | POA: Insufficient documentation

## 2013-10-06 DIAGNOSIS — R609 Edema, unspecified: Secondary | ICD-10-CM | POA: Insufficient documentation

## 2013-10-06 NOTE — Telephone Encounter (Signed)
Patient notified by Technician concerning results.  Continue care as directed at visit.

## 2013-10-06 NOTE — Telephone Encounter (Signed)
Study Result    CLINICAL DATA: Calf pain, edema  EXAM:  RIGHT LOWER EXTREMITY VENOUS DOPPLER ULTRASOUND  TECHNIQUE:  Gray-scale sonography with compression, as well as color and duplex  ultrasound, were performed to evaluate the deep venous system from  the level of the common femoral vein through the popliteal and  proximal calf veins.  COMPARISON: None  FINDINGS:  Normal compressibility of the common femoral, superficial femoral,  and popliteal veins, as well as the proximal calf veins. No filling  defects to suggest DVT on grayscale or color Doppler imaging.  Doppler waveforms show normal direction of venous flow, normal  respiratory phasicity and response to augmentation. Visualized  segments of saphenous venous system normal in caliber and  compressibility.  IMPRESSION:  No evidence of lower extremity deep vein thrombosis.  Electronically Signed  By: Arne Cleveland M.D.  On: 10/06/2013 14:54   [place] to release patient w/results/SLS

## 2013-11-16 ENCOUNTER — Ambulatory Visit
Admission: RE | Admit: 2013-11-16 | Discharge: 2013-11-16 | Disposition: A | Discharge status: Home / Self Care | Payer: Worker's Compensation | Source: Ambulatory Visit | Attending: Family | Admitting: Family

## 2013-11-16 ENCOUNTER — Other Ambulatory Visit: Payer: Self-pay | Admitting: Family

## 2013-11-16 DIAGNOSIS — T1490XA Injury, unspecified, initial encounter: Secondary | ICD-10-CM

## 2014-01-06 ENCOUNTER — Other Ambulatory Visit: Payer: Self-pay | Admitting: Orthopedic Surgery

## 2014-01-06 DIAGNOSIS — M25562 Pain in left knee: Secondary | ICD-10-CM

## 2014-01-09 ENCOUNTER — Ambulatory Visit
Admission: RE | Admit: 2014-01-09 | Discharge: 2014-01-09 | Disposition: A | Discharge status: Home / Self Care | Payer: 59 | Source: Ambulatory Visit | Attending: Orthopedic Surgery | Admitting: Orthopedic Surgery

## 2014-01-09 DIAGNOSIS — M25562 Pain in left knee: Secondary | ICD-10-CM

## 2014-01-12 ENCOUNTER — Other Ambulatory Visit: Payer: Self-pay | Admitting: Orthopedic Surgery

## 2014-01-13 ENCOUNTER — Encounter (HOSPITAL_COMMUNITY): Payer: Self-pay | Admitting: Pharmacy Technician

## 2014-01-17 ENCOUNTER — Encounter (HOSPITAL_COMMUNITY)
Admission: RE | Admit: 2014-01-17 | Discharge: 2014-01-17 | Disposition: A | Discharge status: Home / Self Care | Payer: 59 | Source: Ambulatory Visit | Attending: Orthopedic Surgery | Admitting: Orthopedic Surgery

## 2014-01-17 ENCOUNTER — Encounter (HOSPITAL_COMMUNITY)
Admission: RE | Admit: 2014-01-17 | Discharge: 2014-01-17 | Disposition: A | Discharge status: Home / Self Care | Payer: 59 | Source: Ambulatory Visit | Attending: Anesthesiology | Admitting: Anesthesiology

## 2014-01-17 ENCOUNTER — Encounter (HOSPITAL_COMMUNITY): Payer: Self-pay

## 2014-01-17 DIAGNOSIS — Z01818 Encounter for other preprocedural examination: Secondary | ICD-10-CM | POA: Insufficient documentation

## 2014-01-17 DIAGNOSIS — J45909 Unspecified asthma, uncomplicated: Secondary | ICD-10-CM | POA: Insufficient documentation

## 2014-01-17 DIAGNOSIS — Z01812 Encounter for preprocedural laboratory examination: Secondary | ICD-10-CM | POA: Insufficient documentation

## 2014-01-17 DIAGNOSIS — Z01810 Encounter for preprocedural cardiovascular examination: Secondary | ICD-10-CM | POA: Diagnosis present

## 2014-01-17 HISTORY — DX: Family history of other specified conditions: Z84.89

## 2014-01-17 HISTORY — DX: Fibromyalgia: M79.7

## 2014-01-17 HISTORY — DX: Cardiac arrhythmia, unspecified: I49.9

## 2014-01-17 HISTORY — DX: Anemia, unspecified: D64.9

## 2014-01-17 HISTORY — DX: Pneumonia, unspecified organism: J18.9

## 2014-01-17 LAB — BASIC METABOLIC PANEL
ANION GAP: 12 (ref 5–15)
BUN: 13 mg/dL (ref 6–23)
CHLORIDE: 101 meq/L (ref 96–112)
CO2: 26 mEq/L (ref 19–32)
Calcium: 9.5 mg/dL (ref 8.4–10.5)
Creatinine, Ser: 0.6 mg/dL (ref 0.50–1.10)
GFR calc Af Amer: >90 mL/min (ref >90)
Glucose, Bld: 101 mg/dL — ABNORMAL HIGH (ref 70–99)
POTASSIUM: 4.3 meq/L (ref 3.7–5.3)
SODIUM: 139 meq/L (ref 137–147)

## 2014-01-17 LAB — CBC
HCT: 45.8 % (ref 36.0–46.0)
HEMOGLOBIN: 15.5 g/dL — AB (ref 12.0–15.0)
MCH: 30.7 pg (ref 26.0–34.0)
MCHC: 33.8 g/dL (ref 30.0–36.0)
MCV: 90.7 fL (ref 78.0–100.0)
Platelets: 309 10*3/uL (ref 150–400)
RBC: 5.05 MIL/uL (ref 3.87–5.11)
RDW: 13.8 % (ref 11.5–15.5)
WBC: 8.1 10*3/uL (ref 4.0–10.5)

## 2014-01-17 MED ORDER — CHLORHEXIDINE GLUCONATE 4 % EX LIQD: 60.0000 mL | Freq: Once | CUTANEOUS | Status: DC

## 2014-01-17 NOTE — Pre-Procedure Instructions (Signed)
Christine Rodgers  01/17/2014   Your procedure is scheduled on:  Monday August 10 th at 1300 PM  Report to [facility] Admitting at 1100 AM.  Call this number if you have problems the morning of surgery: [phone]   Remember:   Do not eat food or drink liquids after midnight Sunday   Take these medicines the morning of surgery with A SIP OF WATER: Tylenol if needed for pain, Proventil inhaler if needed and bring day of surgery. Stop Diclofenac, vitamin, and fish oil 5 days prior to surgery   Do not wear jewelry, make-up or nail polish.  Do not wear lotions, powders, or perfumes. You may wear deodorant.  Do not shave 48 hours prior to surgery.   Do not bring valuables to the hospital.  [facility] is not responsible    for any belongings or valuables.               Contacts, dentures or bridgework may not be worn into surgery.  Leave suitcase in the car. After surgery it may be brought to your room.  For patients admitted to the hospital, discharge time is determined by your  treatment team.               Patients discharged the day of surgery will not be allowed to drive home.    Special Instructions: [facility] - Preparing for Surgery  Before surgery, you can play an important role.  Because skin is not sterile, your skin needs to be as free of germs as possible.  You can reduce the number of germs on you skin by washing with CHG (chlorahexidine gluconate) soap before surgery.  CHG is an antiseptic cleaner which kills germs and bonds with the skin to continue killing germs even after washing.  Please DO NOT use if you have an allergy to CHG or antibacterial soaps.  If your skin becomes reddened/irritated stop using the CHG and inform your nurse when you arrive at Short Stay.  Do not shave (including legs and underarms) for at least 48 hours prior to the first CHG shower.  You may shave your face.  Please follow these instructions carefully:   1.  Shower with CHG Soap  the night before surgery and the                                morning of Surgery.  2.  If you choose to wash your hair, wash your hair first as usual with your       normal shampoo.  3.  After you shampoo, rinse your hair and body thoroughly to remove the                      Shampoo.  4.  Use CHG as you would any other liquid soap.  You can apply chg directly       to the skin and wash gently with scrungie or a clean washcloth.  5.  Apply the CHG Soap to your body ONLY FROM THE NECK DOWN.        Do not use on open wounds or open sores.  Avoid contact with your eyes,       ears, mouth and genitals (private parts).  Wash genitals (private parts)       with your normal soap.  6.  Wash thoroughly, paying special attention to the  area where your surgery        will be performed.  7.  Thoroughly rinse your body with warm water from the neck down.  8.  DO NOT shower/wash with your normal soap after using and rinsing off       the CHG Soap.  9.  Pat yourself dry with a clean towel.            10.  Wear clean pajamas.            11.  Place clean sheets on your bed the night of your first shower and do not        sleep with pets.  Day of Surgery  Do not apply any lotions/deoderants the morning of surgery.  Please wear clean clothes to the hospital/surgery center.      Please read over the following fact sheets that you were given: Pain Booklet, Coughing and Deep Breathing and Surgical Site Infection Prevention

## 2014-01-22 MED ORDER — SODIUM CHLORIDE 0.9 % IV SOLN: INTRAVENOUS | Status: DC

## 2014-01-22 MED ORDER — VANCOMYCIN HCL 10 G IV SOLR
1500.0000 mg | INTRAVENOUS | Status: AC
  Administered 2014-01-23: 1500 mg via INTRAVENOUS
  Filled 2014-01-22: qty 1500

## 2014-01-22 MED ORDER — CHLORHEXIDINE GLUCONATE 4 % EX LIQD
60.0000 mL | Freq: Once | CUTANEOUS | Status: DC
  Filled 2014-01-22: qty 60

## 2014-01-23 ENCOUNTER — Ambulatory Visit (HOSPITAL_COMMUNITY): Payer: 59 | Admitting: Certified Registered Nurse Anesthetist

## 2014-01-23 ENCOUNTER — Encounter (HOSPITAL_COMMUNITY)
Admission: RE | Disposition: A | Discharge status: Home / Self Care | Payer: Self-pay | Source: Ambulatory Visit | Attending: Orthopedic Surgery

## 2014-01-23 ENCOUNTER — Encounter (HOSPITAL_COMMUNITY): Payer: 59 | Admitting: Certified Registered Nurse Anesthetist

## 2014-01-23 ENCOUNTER — Ambulatory Visit (HOSPITAL_COMMUNITY)
Admission: RE | Admit: 2014-01-23 | Discharge: 2014-01-23 | Disposition: A | Discharge status: Home / Self Care | Payer: 59 | Source: Ambulatory Visit | Attending: Orthopedic Surgery | Admitting: Orthopedic Surgery

## 2014-01-23 DIAGNOSIS — Z885 Allergy status to narcotic agent status: Secondary | ICD-10-CM | POA: Diagnosis not present

## 2014-01-23 DIAGNOSIS — IMO0001 Reserved for inherently not codable concepts without codable children: Secondary | ICD-10-CM | POA: Insufficient documentation

## 2014-01-23 DIAGNOSIS — Z882 Allergy status to sulfonamides status: Secondary | ICD-10-CM | POA: Insufficient documentation

## 2014-01-23 DIAGNOSIS — Z87891 Personal history of nicotine dependence: Secondary | ICD-10-CM | POA: Diagnosis not present

## 2014-01-23 DIAGNOSIS — J45909 Unspecified asthma, uncomplicated: Secondary | ICD-10-CM | POA: Diagnosis not present

## 2014-01-23 DIAGNOSIS — Z880 Allergy status to penicillin: Secondary | ICD-10-CM | POA: Insufficient documentation

## 2014-01-23 DIAGNOSIS — Y929 Unspecified place or not applicable: Secondary | ICD-10-CM | POA: Diagnosis not present

## 2014-01-23 DIAGNOSIS — Z881 Allergy status to other antibiotic agents status: Secondary | ICD-10-CM | POA: Insufficient documentation

## 2014-01-23 DIAGNOSIS — L259 Unspecified contact dermatitis, unspecified cause: Secondary | ICD-10-CM | POA: Insufficient documentation

## 2014-01-23 DIAGNOSIS — X500XXA Overexertion from strenuous movement or load, initial encounter: Secondary | ICD-10-CM | POA: Diagnosis not present

## 2014-01-23 DIAGNOSIS — IMO0002 Reserved for concepts with insufficient information to code with codable children: Secondary | ICD-10-CM | POA: Insufficient documentation

## 2014-01-23 DIAGNOSIS — E785 Hyperlipidemia, unspecified: Secondary | ICD-10-CM | POA: Insufficient documentation

## 2014-01-23 DIAGNOSIS — Z886 Allergy status to analgesic agent status: Secondary | ICD-10-CM | POA: Diagnosis not present

## 2014-01-23 HISTORY — PX: KNEE ARTHROSCOPY: SHX127

## 2014-01-23 SURGERY — ARTHROSCOPY, KNEE
Anesthesia: General | Laterality: Left

## 2014-01-23 MED ORDER — PROPOFOL 10 MG/ML IV BOLUS
INTRAVENOUS | Status: DC | PRN
  Administered 2014-01-23: 180 mg via INTRAVENOUS

## 2014-01-23 MED ORDER — SODIUM CHLORIDE 0.9 % IR SOLN
Status: DC | PRN
  Administered 2014-01-23 (×2): 3000 mL

## 2014-01-23 MED ORDER — FENTANYL CITRATE 0.05 MG/ML IJ SOLN
INTRAMUSCULAR | Status: DC | PRN
  Administered 2014-01-23 (×3): 50 ug via INTRAVENOUS

## 2014-01-23 MED ORDER — MIDAZOLAM HCL 5 MG/5ML IJ SOLN
INTRAMUSCULAR | Status: DC | PRN
  Administered 2014-01-23: 2 mg via INTRAVENOUS

## 2014-01-23 MED ORDER — BUPIVACAINE-EPINEPHRINE 0.5% -1:200000 IJ SOLN
INTRAMUSCULAR | Status: DC | PRN
  Administered 2014-01-23: 10 mL

## 2014-01-23 MED ORDER — LIDOCAINE HCL (CARDIAC) 10 MG/ML IV SOLN
INTRAVENOUS | Status: DC | PRN
  Administered 2014-01-23: 60 mg via INTRAVENOUS

## 2014-01-23 MED ORDER — DEXAMETHASONE SODIUM PHOSPHATE 4 MG/ML IJ SOLN
INTRAMUSCULAR | Status: DC | PRN
  Administered 2014-01-23: 4 mg via INTRAVENOUS

## 2014-01-23 MED ORDER — HYDROMORPHONE HCL 2 MG PO TABS
ORAL_TABLET | ORAL | Status: AC
  Administered 2014-01-23: 2 mg via ORAL
  Filled 2014-01-23: qty 1

## 2014-01-23 MED ORDER — ACETAMINOPHEN 325 MG PO TABS
650.0000 mg | ORAL_TABLET | Freq: Four times a day (QID) | ORAL | Status: DC | PRN
  Administered 2014-01-23: 650 mg via ORAL

## 2014-01-23 MED ORDER — HYDROCODONE-ACETAMINOPHEN 5-325 MG PO TABS: 1.0000 | ORAL_TABLET | ORAL | Status: DC | PRN

## 2014-01-23 MED ORDER — FENTANYL CITRATE 0.05 MG/ML IJ SOLN
INTRAMUSCULAR | Status: AC
  Administered 2014-01-23: 50 ug via INTRAVENOUS
  Filled 2014-01-23: qty 2

## 2014-01-23 MED ORDER — BUPIVACAINE-EPINEPHRINE (PF) 0.5% -1:200000 IJ SOLN
INTRAMUSCULAR | Status: AC
  Filled 2014-01-23: qty 30

## 2014-01-23 MED ORDER — FENTANYL CITRATE 0.05 MG/ML IJ SOLN
INTRAMUSCULAR | Status: AC
  Filled 2014-01-23: qty 5

## 2014-01-23 MED ORDER — FENTANYL CITRATE 0.05 MG/ML IJ SOLN
INTRAMUSCULAR | Status: AC
  Administered 2014-01-23: 25 ug via INTRAVENOUS
  Filled 2014-01-23: qty 2

## 2014-01-23 MED ORDER — ACETAMINOPHEN 325 MG PO TABS
ORAL_TABLET | ORAL | Status: AC
  Filled 2014-01-23: qty 2

## 2014-01-23 MED ORDER — IBUPROFEN 200 MG PO TABS: 600.0000 mg | ORAL_TABLET | Freq: Three times a day (TID) | ORAL | Status: DC

## 2014-01-23 MED ORDER — LACTATED RINGERS IV SOLN
INTRAVENOUS | Status: DC
  Administered 2014-01-23: 11:00:00 via INTRAVENOUS

## 2014-01-23 MED ORDER — HYDROMORPHONE HCL 2 MG PO TABS
2.0000 mg | ORAL_TABLET | Freq: Once | ORAL | Status: AC
  Administered 2014-01-23: 2 mg via ORAL

## 2014-01-23 MED ORDER — HYDROMORPHONE HCL 2 MG PO TABS: 2.0000 mg | ORAL_TABLET | ORAL | Status: DC | PRN

## 2014-01-23 MED ORDER — FENTANYL CITRATE 0.05 MG/ML IJ SOLN
25.0000 ug | INTRAMUSCULAR | Status: DC | PRN
  Administered 2014-01-23: 25 ug via INTRAVENOUS
  Administered 2014-01-23 (×2): 50 ug via INTRAVENOUS
  Administered 2014-01-23: 25 ug via INTRAVENOUS

## 2014-01-23 MED ORDER — ONDANSETRON HCL 4 MG/2ML IJ SOLN
INTRAMUSCULAR | Status: DC | PRN
  Administered 2014-01-23: 4 mg via INTRAVENOUS

## 2014-01-23 MED ORDER — PROPOFOL 10 MG/ML IV BOLUS
INTRAVENOUS | Status: AC
  Filled 2014-01-23: qty 20

## 2014-01-23 MED ORDER — MIDAZOLAM HCL 2 MG/2ML IJ SOLN
INTRAMUSCULAR | Status: AC
  Filled 2014-01-23: qty 2

## 2014-01-23 SURGICAL SUPPLY — 43 items
BANDAGE ELASTIC 4 VELCRO ST LF (GAUZE/BANDAGES/DRESSINGS) ×3 IMPLANT
BANDAGE ELASTIC 6 VELCRO ST LF (GAUZE/BANDAGES/DRESSINGS) ×3 IMPLANT
BANDAGE ESMARK 6X9 LF (GAUZE/BANDAGES/DRESSINGS) IMPLANT
BLADE CUDA 5.5 (BLADE) IMPLANT
BLADE CUTTER GATOR 3.5 (BLADE) IMPLANT
BLADE GREAT WHITE 4.2 (BLADE) ×2 IMPLANT
BLADE GREAT WHITE 4.2MM (BLADE) ×1
BNDG ESMARK 6X9 LF (GAUZE/BANDAGES/DRESSINGS)
BUR OVAL 6.0 (BURR) IMPLANT
CUFF TOURNIQUET SINGLE 34IN LL (TOURNIQUET CUFF) IMPLANT
DRAPE ARTHROSCOPY W/POUCH 114 (DRAPES) ×3 IMPLANT
DRAPE U-SHAPE 47X51 STRL (DRAPES) ×3 IMPLANT
ELECT CAUTERY BLADE 6.4 (BLADE) IMPLANT
ELECT MENISCUS 165MM 90D (ELECTRODE) IMPLANT
ELECT REM PT RETURN 9FT ADLT (ELECTROSURGICAL)
ELECTRODE REM PT RTRN 9FT ADLT (ELECTROSURGICAL) IMPLANT
GAUZE SPONGE 4X4 12PLY STRL (GAUZE/BANDAGES/DRESSINGS) ×3 IMPLANT
GAUZE XEROFORM 1X8 LF (GAUZE/BANDAGES/DRESSINGS) ×3 IMPLANT
GLOVE BIOGEL PI IND STRL 8.5 (GLOVE) ×2 IMPLANT
GLOVE BIOGEL PI INDICATOR 8.5 (GLOVE) ×4
GLOVE SURG ORTHO 8.0 STRL STRW (GLOVE) ×6 IMPLANT
GOWN STRL REUS W/ TWL LRG LVL3 (GOWN DISPOSABLE) ×3 IMPLANT
GOWN STRL REUS W/TWL LRG LVL3 (GOWN DISPOSABLE) ×6
KIT ROOM TURNOVER OR (KITS) ×3 IMPLANT
MANIFOLD NEPTUNE II (INSTRUMENTS) ×3 IMPLANT
NEEDLE 18GX1X1/2 (RX/OR ONLY) (NEEDLE) ×3 IMPLANT
PACK ARTHROSCOPY DSU (CUSTOM PROCEDURE TRAY) ×3 IMPLANT
PAD ARMBOARD 7.5X6 YLW CONV (MISCELLANEOUS) ×6 IMPLANT
PAD CAST 4YDX4 CTTN HI CHSV (CAST SUPPLIES) ×1 IMPLANT
PADDING CAST COTTON 4X4 STRL (CAST SUPPLIES) ×2
PENCIL BUTTON HOLSTER BLD 10FT (ELECTRODE) IMPLANT
SET ARTHROSCOPY TUBING (MISCELLANEOUS) ×2
SET ARTHROSCOPY TUBING LN (MISCELLANEOUS) ×1 IMPLANT
SPONGE GAUZE 4X4 12PLY STER LF (GAUZE/BANDAGES/DRESSINGS) ×3 IMPLANT
SPONGE LAP 4X18 X RAY DECT (DISPOSABLE) ×3 IMPLANT
SUT ETHILON 4 0 PS 2 18 (SUTURE) ×3 IMPLANT
SYR 30ML LL (SYRINGE) ×6 IMPLANT
TOWEL OR 17X24 6PK STRL BLUE (TOWEL DISPOSABLE) ×3 IMPLANT
TOWEL OR 17X26 10 PK STRL BLUE (TOWEL DISPOSABLE) ×3 IMPLANT
TUBE CONNECTING 12'X1/4 (SUCTIONS) ×1
TUBE CONNECTING 12X1/4 (SUCTIONS) ×2 IMPLANT
WAND HAND CNTRL MULTIVAC 90 (MISCELLANEOUS) IMPLANT
WATER STERILE IRR 1000ML POUR (IV SOLUTION) ×3 IMPLANT

## 2014-01-23 NOTE — Anesthesia Preprocedure Evaluation (Signed)
Anesthesia Evaluation  Patient identified by MRN, date of birth, ID band Patient awake    Reviewed: Allergy & Precautions, H&P , NPO status , Patient's Chart, lab work & pertinent test results  Airway Mallampati: II      Dental   Pulmonary asthma , pneumonia -, former smoker,          Cardiovascular  History of irregular heart beat. W/U. CE   Neuro/Psych    GI/Hepatic negative GI ROS, Neg liver ROS,   Endo/Other    Renal/GU negative Renal ROS     Musculoskeletal   Abdominal   Peds  Hematology   Anesthesia Other Findings   Reproductive/Obstetrics                           Anesthesia Physical Anesthesia Plan  ASA: III  Anesthesia Plan: General   Post-op Pain Management:    Induction: Intravenous  Airway Management Planned: LMA  Additional Equipment:   Intra-op Plan:   Post-operative Plan: Extubation in OR  Informed Consent: I have reviewed the patients History and Physical, chart, labs and discussed the procedure including the risks, benefits and alternatives for the proposed anesthesia with the patient or authorized representative who has indicated his/her understanding and acceptance.   Dental advisory given  Plan Discussed with: CRNA and Anesthesiologist  Anesthesia Plan Comments:         Anesthesia Quick Evaluation

## 2014-01-23 NOTE — Op Note (Signed)
Christine Rodgers, Christine Rodgers               ACCOUNT NO.:  [id number]  MEDICAL RECORD NO.:  [id number]  LOCATION:  [facility]                         FACILITY:  [facility]  PHYSICIAN:  Estill Bamberg. Ronnie Derby, M.D. DATE OF BIRTH:  Feb 07, 1961  DATE OF PROCEDURE:  01/23/2014 DATE OF DISCHARGE:  01/23/2014                              OPERATIVE REPORT   SURGEON:  Estill Bamberg. Ronnie Derby, M.D.  ASSISTANTLowell Guitar. Mercie Eon.  ANESTHESIA:  General.  PREOPERATIVE DIAGNOSIS:  Left knee medial meniscus tear.  POSTOPERATIVE DIAGNOSIS:  Left knee medial meniscus tear.  PROCEDURE:  Left knee arthroscopy with partial medial meniscectomy.  INDICATION FOR PROCEDURE:  The patient is a 53 year old white female with plan and twist injury and MRI evidence of a meniscal tear. Informed consent was obtained.  DESCRIPTION OF PROCEDURE:  The patient was laid in the supine position under general anesthesia.  The right leg was prepped and draped in usual fashion.  Inferolateral and inferomedial portals were created with #11 blade, blunt trocar, and cannula.  Diagnostic arthroscopy revealed no chondromalacia in the patellofemoral joint.  There was a large hyperemic medial plica, which was debrided with the [employer] shaver.  I then went in the medial compartment.  There was a very obvious and large tear on the posterior horn of the meniscus.  There was a large radial tear, which was very very unstable.  Straight and up-biting basket forceps and [place] shaver were used to perform partial medial meniscectomy.  I then lavaged, went into the notch.  The ACL and PCL were normal.  Went into the lateral compartment in figure-of-four position, and it was also normal.  I then lavaged and closed with 4-0 nylon sutures.  Dressed with Xeroform dressing, sponges, sterile Webril, and an Ace wrap.  COMPLICATIONS:  None.  DRAINS:  None.          ______________________________ Estill Bamberg. Ronnie Derby, M.D.    SDL/MEDQ  D:  01/23/2014  T:   01/23/2014  Job:  [id number]

## 2014-01-23 NOTE — Progress Notes (Signed)
Orthopedic Tech Progress Note Patient Details:  Christine Rodgers 31-Jan-1961 [id number]  Ortho Devices Type of Ortho Device: Crutches Ortho Device/Splint Interventions: Application Viewed order from rn order list  Hildred Priest 01/23/2014, 2:47 PM

## 2014-01-23 NOTE — H&P (Signed)
Christine Rodgers MRN:  [id number] DOB/SEX:  09-14-1960/female  CHIEF COMPLAINT:  Painful left Knee  HISTORY: Patient is a 53 y.o. female presented with a history of pain in the left knee. Onset of symptoms was abrupt starting several months ago with rapidly worsening course since that time. Prior procedures on the knee include none. Patient has been treated conservatively with over-the-counter NSAIDs and activity modification. Patient currently rates pain in the knee at 7 out of 10 with activity. There is no pain at night.  PAST MEDICAL HISTORY: Patient Active Problem List   Diagnosis Date Noted  . Right calf pain 10/05/2013  . Sinusitis 05/27/2013  . Palpitations 01/05/2013  . Routine general medical examination at a health care facility 01/12/2012  . Amenorrhea 01/12/2012  . Mole of skin 01/12/2012  . Hearing problem 01/12/2012  . Knee pain, right 10/29/2011  . Hematochezia 10/29/2011  . ARTHRITIS 04/17/2010  . ALLERGY 04/17/2010  . CHICKENPOX, HX OF 04/17/2010  . HYPERLIPIDEMIA 02/28/2010  . ECZEMA 02/27/2010  . CHEST PAIN, ATYPICAL 02/27/2010   Past Medical History  Diagnosis Date  . Hyperlipidemia   . History of chicken pox   . Eczema   . Arthritis     knees  . Asthma   . Allergy   . Family history of anesthesia complication     father   . Dysrhythmia   . Pneumonia     3-4 years ago  . Fibromyalgia   . Anemia     occasional low hbg   Past Surgical History  Procedure Laterality Date  . Bunionectomy  2008    right foot  . Cervical biopsy      benign     MEDICATIONS:   No prescriptions prior to admission    ALLERGIES:   Allergies  Allergen Reactions  . Aspirin     REACTION: severe nausea  . Codeine     REACTION: severe nausea  . Doxycycline Hyclate     REACTION: hives, throat swelling  . Penicillins     REACTION: passed out  . Sulfonamide Derivatives     REACTION: diarrhea, vomitting  . Levaquin [Levofloxacin In D5w] Rash    REVIEW OF  SYSTEMS:  A comprehensive review of systems was negative.   FAMILY HISTORY:   Family History  Problem Relation Age of Onset  . Hyperlipidemia Mother   . Hyperlipidemia Father     Deceased 58- sepsis/?pneumonia  . Arrhythmia Father   . Ulcerative colitis Daughter   . Diabetes Maternal Grandmother   . Diabetes Maternal Grandfather   . Diabetes Paternal Grandmother   . Stroke Paternal Grandmother   . Stroke Paternal Grandfather   . Colon cancer Neg Hx   . Stomach cancer Neg Hx     SOCIAL HISTORY:   History  Substance Use Topics  . Smoking status: Former Smoker -- 0.50 packs/day for 2 years    Types: Cigarettes    Quit date: 01/18/2004  . Smokeless tobacco: Never Used  . Alcohol Use: 4.2 oz/week    7 Glasses of wine per week     Comment: occ     EXAMINATION:  Vital signs in last 24 hours:    General appearance: alert, cooperative and no distress Lungs: clear to auscultation bilaterally Heart: regular rate and rhythm, S1, S2 normal, no murmur, click, rub or gallop Abdomen: soft, non-tender; bowel sounds normal; no masses,  no organomegaly Extremities: extremities normal, atraumatic, no cyanosis or edema and Homans sign is negative, no sign  of DVT Pulses: 2+ and symmetric Skin: Skin color, texture, turgor normal. No rashes or lesions Neurologic: Alert and oriented X 3, normal strength and tone. Normal symmetric reflexes. 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 The overall alignment is neutral. The bone quality appears to be good for age and reported activity level.  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 The patient has failed conservative treatment.  The clearance notes were reviewed.  After  discussion with the patient it was felt that Left knee arthroscopy was indicated. The procedure,  risks, and benefits of total knee arthroplasty were presented and reviewed. The risks including but not limited to aseptic loosening, infection, blood clots, vascular injury, stiffness, patella tracking problems complications among others were discussed. The patient acknowledged the explanation, agreed to proceed with the plan.  Dasan Hardman 01/23/2014, 6:50 AM

## 2014-01-23 NOTE — Anesthesia Procedure Notes (Signed)
Procedure Name: LMA Insertion Date/Time: 01/23/2014 11:46 AM Performed by: Carola Frost Pre-anesthesia Checklist: Patient identified, Timeout performed, Emergency Drugs available, Suction available and Patient being monitored Patient Re-evaluated:Patient Re-evaluated prior to inductionOxygen Delivery Method: Circle system utilized Preoxygenation: Pre-oxygenation with 100% oxygen Intubation Type: IV induction LMA: LMA inserted LMA Size: 4.0 Number of attempts: 1 Placement Confirmation: CO2 detector,  positive ETCO2 and breath sounds checked- equal and bilateral Tube secured with: Tape Dental Injury: Teeth and Oropharynx as per pre-operative assessment

## 2014-01-23 NOTE — Op Note (Signed)
Dictation Number:  [id number]

## 2014-01-23 NOTE — Anesthesia Postprocedure Evaluation (Signed)
  Anesthesia Post-op Note  Patient: Christine Rodgers  Procedure(s) Performed: Procedure(s): ARTHROSCOPY KNEE (Left)  Patient Location: PACU  Anesthesia Type:General  Level of Consciousness: awake  Airway and Oxygen Therapy: Patient Spontanous Breathing  Post-op Pain: mild  Post-op Assessment: Post-op Vital signs reviewed  Post-op Vital Signs: Reviewed  Last Vitals:  Filed Vitals:   01/23/14 1427  BP:   Pulse:   Temp:   Resp: 15    Complications: No apparent anesthesia complications

## 2014-01-23 NOTE — Transfer of Care (Signed)
Immediate Anesthesia Transfer of Care Note  Patient: Christine Rodgers  Procedure(s) Performed: Procedure(s): ARTHROSCOPY KNEE (Left)  Patient Location: PACU  Anesthesia Type:General  Level of Consciousness: awake, alert , oriented, patient cooperative and responds to stimulation  Airway & Oxygen Therapy: Patient Spontanous Breathing and Patient connected to nasal cannula oxygen  Post-op Assessment: Report given to PACU RN, Post -op Vital signs reviewed and stable and Patient moving all extremities X 4  Post vital signs: Reviewed and stable  Complications: No apparent anesthesia complications

## 2014-01-23 NOTE — Discharge Instructions (Signed)
Diet: As you were doing prior to hospitalization   Activity:  Increase activity slowly as tolerated                  No lifting or driving for 6 weeks  Shower:  May shower without a dressing once there is no drainage from your wound.                 Do NOT wash over the wound.                 Dressing:  You may change your dressing on Wednesday                    Then change the dressing daily with sterile 4"x4"s gauze dressing                     And TED hose for knees.  Weight Bearing:  Weight bearing as tolerated as taught in physical therapy.  Use a                                walker or Crutches as instructed.  To prevent constipation: you may use a stool softener such as -               Colace ( over the counter) 100 mg by mouth twice a day                Drink plenty of fluids ( prune juice may be helpful) and high fiber foods                Miralax ( over the counter) for constipation as needed.    Precautions:  If you experience chest pain or shortness of breath - call 911 immediately               For transfer to the hospital emergency department!!               If you develop a fever greater that 101 F, purulent drainage from wound,                             increased redness or drainage from wound, or calf pain -- Call the office.  Follow- Up Appointment:  Please call for an appointment to be seen on 01/26/14                                              [facility] office:  [phone]            [place], [place] [address]                 What to eat:  For your first meals, you should eat lightly; only small meals initially.  If you do not have nausea, you may eat larger meals.  Avoid spicy, greasy and heavy food.    General Anesthesia, Adult, Care After  Refer to this sheet in the next few weeks. These instructions provide you with information on caring for yourself after your procedure. Your health care provider may also give you more specific  instructions. Your treatment has been planned according to current medical practices, but problems sometimes occur.  Call your health care provider if you have any problems or questions after your procedure.  WHAT TO EXPECT AFTER THE PROCEDURE  After the procedure, it is typical to experience:  Sleepiness.  Nausea and vomiting. HOME CARE INSTRUCTIONS  For the first 24 hours after general anesthesia:  Have a responsible person with you.  Do not drive a car. If you are alone, do not take public transportation.  Do not drink alcohol.  Do not take medicine that has not been prescribed by your health care provider.  Do not sign important papers or make important decisions.  You may resume a normal diet and activities as directed by your health care provider.  Change bandages (dressings) as directed.  If you have questions or problems that seem related to general anesthesia, call the hospital and ask for the anesthetist or anesthesiologist on call. SEEK MEDICAL CARE IF:  You have nausea and vomiting that continue the day after anesthesia.  You develop a rash. SEEK IMMEDIATE MEDICAL CARE IF:  You have difficulty breathing.  You have chest pain.  You have any allergic problems. Document Released: 09/08/2000 Document Revised: 02/02/2013 Document Reviewed: 12/16/2012  [facility] Patient Information 2014 [place], [place].

## 2014-01-26 ENCOUNTER — Encounter (HOSPITAL_COMMUNITY): Payer: Self-pay | Admitting: Orthopedic Surgery

## 2014-03-21 ENCOUNTER — Other Ambulatory Visit: Payer: Self-pay | Admitting: Certified Nurse Midwife

## 2014-03-21 DIAGNOSIS — R921 Mammographic calcification found on diagnostic imaging of breast: Secondary | ICD-10-CM

## 2014-03-27 ENCOUNTER — Other Ambulatory Visit: Payer: Self-pay | Admitting: Certified Nurse Midwife

## 2014-03-27 ENCOUNTER — Ambulatory Visit
Admission: RE | Admit: 2014-03-27 | Discharge: 2014-03-27 | Disposition: A | Discharge status: Home / Self Care | Payer: 59 | Source: Ambulatory Visit | Attending: Certified Nurse Midwife | Admitting: Certified Nurse Midwife

## 2014-03-27 DIAGNOSIS — R921 Mammographic calcification found on diagnostic imaging of breast: Secondary | ICD-10-CM

## 2014-04-03 ENCOUNTER — Ambulatory Visit
Admission: RE | Admit: 2014-04-03 | Discharge: 2014-04-03 | Disposition: A | Discharge status: Home / Self Care | Payer: 59 | Source: Ambulatory Visit | Attending: Certified Nurse Midwife | Admitting: Certified Nurse Midwife

## 2014-04-03 DIAGNOSIS — R921 Mammographic calcification found on diagnostic imaging of breast: Secondary | ICD-10-CM

## 2015-08-02 ENCOUNTER — Other Ambulatory Visit (HOSPITAL_COMMUNITY)
Admission: RE | Admit: 2015-08-02 | Discharge: 2015-08-02 | Disposition: A | Discharge status: Home / Self Care | Payer: Commercial Managed Care - HMO | Source: Ambulatory Visit | Attending: Medical | Admitting: Medical

## 2015-08-02 ENCOUNTER — Ambulatory Visit (INDEPENDENT_AMBULATORY_CARE_PROVIDER_SITE_OTHER): Payer: Commercial Managed Care - HMO | Admitting: Medical

## 2015-08-02 ENCOUNTER — Encounter: Payer: Self-pay | Admitting: Medical

## 2015-08-02 VITALS — BP 118/78 | HR 67 | Temp 98.0°F | Ht 67.0 in | Wt 220.0 lb

## 2015-08-02 DIAGNOSIS — R300 Dysuria: Secondary | ICD-10-CM

## 2015-08-02 DIAGNOSIS — J0100 Acute maxillary sinusitis, unspecified: Secondary | ICD-10-CM

## 2015-08-02 DIAGNOSIS — N760 Acute vaginitis: Secondary | ICD-10-CM | POA: Diagnosis present

## 2015-08-02 DIAGNOSIS — R82998 Other abnormal findings in urine: Secondary | ICD-10-CM

## 2015-08-02 DIAGNOSIS — N390 Urinary tract infection, site not specified: Secondary | ICD-10-CM | POA: Diagnosis not present

## 2015-08-02 LAB — POC URINALSYSI DIPSTICK (AUTOMATED)
Bilirubin, UA: NEGATIVE
Glucose, UA: NEGATIVE
Ketones, UA: NEGATIVE
NITRITE UA: NEGATIVE
PROTEIN UA: NEGATIVE
Spec Grav, UA: 1.015
Urobilinogen, UA: 1
pH, UA: 6.5

## 2015-08-02 MED ORDER — CEPHALEXIN 500 MG PO CAPS: 500.0000 mg | ORAL_CAPSULE | Freq: Two times a day (BID) | ORAL | Status: DC

## 2015-08-02 MED ORDER — FLUCONAZOLE 150 MG PO TABS: 150.0000 mg | ORAL_TABLET | Freq: Once | ORAL | Status: DC

## 2015-08-02 MED ORDER — FLUTICASONE PROPIONATE 50 MCG/ACT NA SUSP: 2.0000 | Freq: Every day | NASAL | Status: DC

## 2015-08-02 NOTE — Patient Instructions (Addendum)
You appear to have a urinary tract infection. I am prescribing  Cephalexin antibiotic for the probable infection. Hydrate well. I am sending out a urine culture and ancillary studies. During the interim if your signs and symptoms worsen rather than improving please notify us. We will notify your when the culture results are back.  If symptoms worsen rocephin IM is option since appears you tolerated ceftin in past no reaction.  For nasal congestion flonase.  For possilbe sinus infection will cephalexin can treat sinus as well.   Follow up in 7 days or as needed.  Rx diflucan. But would start cephalexin first and see if your urethral region irrition improves before taking diflucan.

## 2015-08-02 NOTE — Progress Notes (Signed)
Subjective:    Patient ID: Christine Rodgers, female    DOB: [birth year], 55 y.o.   MRN: [id number]  HPI   Pt in today reporting urinary symptoms for 3-4 days.   Dysuria- yes Frequent urination- yes. Hesitancy-no Suprapubic pressure- yes Fever-102. Last night.  chills-yes Nausea-no Vomiting-no CVA pain-both sides Gross hematuria- none History of UTI- States very unusual for her to have uti.  Pt had no nausea or vomiting.   Pt  Husband had faint pain in his urethra.  Pt also states some vaginal irritation around urethra.   Pt also has some sinus pressure and nasal congestion. No sneezing and no itching eyes. Last 3 days.    Review of Systems  Constitutional: Negative for fever, chills and fatigue.  HENT: Positive for congestion and sinus pressure. Negative for ear discharge, ear pain, facial swelling, nosebleeds, postnasal drip and sneezing.   Respiratory: Negative for cough, chest tightness, shortness of breath and wheezing.   Cardiovascular: Negative for chest pain and palpitations.  Genitourinary: Positive for dysuria, urgency and frequency. Negative for pelvic pain.  Musculoskeletal: Positive for back pain.    Past Medical History  Diagnosis Date  . Hyperlipidemia   . History of chicken pox   . Eczema   . Arthritis     knees  . Asthma   . Allergy   . Family history of anesthesia complication     father   . Dysrhythmia   . Pneumonia     3-4 years ago  . Fibromyalgia   . Anemia     occasional low hbg    Social History   Social History  . Marital Status: Married    Spouse Name: N/A  . Number of Children: 3  . Years of Education: N/A   Occupational History  . 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 Smoking status: Former Smoker -- 0.50 packs/day for 2 years    Types: Cigarettes    Quit date: 01/18/2004  . Smokeless tobacco: Never Used  . Alcohol Use: 4.2 oz/week    7 Glasses of wine per week     Comment: occ  . Drug  Use: No  . Sexual Activity: Not on file   Other Topics Concern  . Not on file   Social History Narrative   Regular exercise:  No   Caffeine use:  2 mugs of coffee daily                Past Surgical History  Procedure Laterality Date  . Bunionectomy  2008    right foot  . Cervical biopsy      benign  . Knee arthroscopy Left 01/23/2014    Procedure: ARTHROSCOPY KNEE;  Surgeon: Vickey Huger, MD;  Location: [facility];  Service: Orthopedics;  Laterality: Left;    Family History  Problem Relation Age of Onset  . Hyperlipidemia Mother   . Hyperlipidemia Father     Deceased 18- sepsis/?pneumonia  . Arrhythmia Father   . Ulcerative colitis Daughter   . Diabetes Maternal Grandmother   . Diabetes Maternal Grandfather   . Diabetes Paternal Grandmother   . Stroke Paternal Grandmother   . Stroke Paternal Grandfather   . Colon cancer Neg Hx   . Stomach cancer Neg Hx     Allergies  Allergen Reactions  . Aspirin     REACTION: severe nausea  . Codeine     REACTION: severe nausea  . Doxycycline Hyclate  REACTION: hives, throat swelling  . Penicillins     REACTION: passed out  . Sulfonamide Derivatives     REACTION: diarrhea, vomitting  . Levaquin [Levofloxacin In D5w] Rash    Current Outpatient Prescriptions on File Prior to Visit  Medication Sig Dispense Refill  . acetaminophen (TYLENOL) 500 MG tablet Take 1,000 mg by mouth daily as needed (for knee pain).    Marland Kitchen albuterol (PROVENTIL HFA;VENTOLIN HFA) 108 (90 BASE) MCG/ACT inhaler Inhale 2 puffs into the lungs every 6 (six) hours as needed for wheezing. 1 Inhaler 0  . ergocalciferol (DRISDOL) 8000 UNIT/ML drops Take 8,000 Units by mouth daily.    . hydrocortisone cream 1 % Apply 1 application topically daily.    Marland Kitchen HYDROmorphone (DILAUDID) 2 MG tablet Take 1 tablet (2 mg total) by mouth every 4 (four) hours as needed. 50 tablet 0  . ibuprofen (MOTRIN IB) 200 MG tablet Take 3 tablets (600 mg total) by mouth 3 (three) times  daily. 90 tablet 2  . levonorgestrel (MIRENA) 20 MCG/24HR IUD 1 each by Intrauterine route once.      . Multiple Vitamin (MULTIVITAMIN) tablet Take 1 tablet by mouth daily.    . Omega-3 Fatty Acids (FISH OIL PO) Take by mouth daily as needed.     Marland Kitchen OVER THE COUNTER MEDICATION Apply 1 application topically 2 (two) times daily.     No current facility-administered medications on file prior to visit.    BP 118/78 mmHg  Pulse 67  Temp(Src) 98 F (36.7 C) (Oral)  Ht 5\' 7"  (1.702 m)  Wt 220 lb (99.791 kg)  BMI 34.45 kg/m2  SpO2 98%       Objective:   Physical Exam  General  Mental Status - Alert. General Appearance - Well groomed. Not in acute distress.  Skin Rashes- No Rashes.  HEENT Head- Normal. Ear Auditory Canal - Left- Normal. Right - Normal.Tympanic Membrane- Left- Normal. Right- Normal. Eye Sclera/Conjunctiva- Left- Normal. Right- Normal. Nose & Sinuses Nasal Mucosa- Left-  Boggy and Congested. Right-  Boggy and  Congested.Bilateral  Rt maxillary and  Rt rontal sinus pressure. Mouth & Throat Lips: Upper Lip- Normal: no dryness, cracking, pallor, cyanosis, or vesicular eruption. Lower Lip-Normal: no dryness, cracking, pallor, cyanosis or vesicular eruption. Buccal Mucosa- Bilateral- No Aphthous ulcers. Oropharynx- No Discharge or Erythema. Tonsils: Characteristics- Bilateral- No Erythema or Congestion. Size/Enlargement- Bilateral- No enlargement. Discharge- bilateral-None.  Neck Neck- Supple. No Masses.   Chest and Lung Exam Auscultation: Breath Sounds:-Clear even and unlabored.  Cardiovascular Auscultation:Rythm- Regular, rate and rhythm. Murmurs & Other Heart Sounds:Ausculatation of the heart reveal- No Murmurs.  Lymphatic Head & Neck General Head & Neck Lymphatics: Bilateral: Description- No Localized lymphadenopathy.   Abdomen Palpation/Percussion: Palpation and Percussion of the abdomen reveal- faint suprapbic pressure sensation on palpation, No  Rebound tenderness, No Rigidity(guarding), No Palpable abdominal masses and No jar tenderness. No suprapubic tenderness. Liver:-Normal. Spleen:- Normal. Other Characteristics- faint bilateral Costovertebral angle tenderness- Left or Costovertebral angle tenderness- Right.  Auscultation: Auscultation of the abdomen reveals- Bowel Sounds normal.       Assessment & Plan:  You appear to have a urinary tract infection. I am prescribing  Cephalexin antibiotic for the probable infection. Hydrate well. I am sending out a urine culture and ancillary studies. During the interim if your signs and symptoms worsen rather than improving please notify us. We will notify your when the culture results are back.  If symptoms worsen rocephin IM is option since appears you tolerated  ceftin in past no reaction.  For nasal congestion flonase.  For possilbe sinus infection will cephalexin can treat sinus as well.   Follow up in 7 days or as needed.  Rx diflucan. But would start cephalexin first and see if your urethral region irrition improves before taking diflucan  Note- based on allergy history and pt level of caution decided not to give rocephin today although is cephalosporin. But may give if needed. Also decided not to do STD testing. She was very convinced husband faithful. If studies negative and symptoms persiting then would reconsider and discuss with pt.

## 2015-08-02 NOTE — Progress Notes (Signed)
Pre visit review using our clinic review tool, if applicable. No additional management support is needed unless otherwise documented below in the visit note. 

## 2015-08-03 LAB — URINE CULTURE

## 2015-08-06 LAB — URINE CYTOLOGY ANCILLARY ONLY
BACTERIAL VAGINITIS: NEGATIVE
CANDIDA VAGINITIS: NEGATIVE

## 2015-08-06 NOTE — Progress Notes (Signed)
Quick Note:  Pt has seen results on MyChart and message also sent for patient to call back if any questions. ______ 

## 2016-02-06 ENCOUNTER — Emergency Department (HOSPITAL_BASED_OUTPATIENT_CLINIC_OR_DEPARTMENT_OTHER)
Admission: EM | Admit: 2016-02-06 | Discharge: 2016-02-06 | Disposition: A | Discharge status: Home / Self Care | Payer: Commercial Managed Care - HMO | Attending: Emergency Medicine | Admitting: Emergency Medicine

## 2016-02-06 ENCOUNTER — Encounter (HOSPITAL_BASED_OUTPATIENT_CLINIC_OR_DEPARTMENT_OTHER): Payer: Self-pay | Admitting: Respiratory Therapy

## 2016-02-06 ENCOUNTER — Emergency Department (HOSPITAL_BASED_OUTPATIENT_CLINIC_OR_DEPARTMENT_OTHER): Payer: Commercial Managed Care - HMO

## 2016-02-06 DIAGNOSIS — S89302A Unspecified physeal fracture of lower end of left fibula, initial encounter for closed fracture: Secondary | ICD-10-CM | POA: Diagnosis not present

## 2016-02-06 DIAGNOSIS — Z791 Long term (current) use of non-steroidal anti-inflammatories (NSAID): Secondary | ICD-10-CM | POA: Diagnosis not present

## 2016-02-06 DIAGNOSIS — W010XXA Fall on same level from slipping, tripping and stumbling without subsequent striking against object, initial encounter: Secondary | ICD-10-CM | POA: Insufficient documentation

## 2016-02-06 DIAGNOSIS — Z79899 Other long term (current) drug therapy: Secondary | ICD-10-CM | POA: Insufficient documentation

## 2016-02-06 DIAGNOSIS — J45909 Unspecified asthma, uncomplicated: Secondary | ICD-10-CM | POA: Diagnosis not present

## 2016-02-06 DIAGNOSIS — S8991XA Unspecified injury of right lower leg, initial encounter: Secondary | ICD-10-CM | POA: Diagnosis present

## 2016-02-06 DIAGNOSIS — Y999 Unspecified external cause status: Secondary | ICD-10-CM | POA: Insufficient documentation

## 2016-02-06 DIAGNOSIS — Y9289 Other specified places as the place of occurrence of the external cause: Secondary | ICD-10-CM | POA: Insufficient documentation

## 2016-02-06 DIAGNOSIS — S80211A Abrasion, right knee, initial encounter: Secondary | ICD-10-CM | POA: Diagnosis not present

## 2016-02-06 DIAGNOSIS — Y9301 Activity, walking, marching and hiking: Secondary | ICD-10-CM | POA: Diagnosis not present

## 2016-02-06 DIAGNOSIS — Z87891 Personal history of nicotine dependence: Secondary | ICD-10-CM | POA: Diagnosis not present

## 2016-02-06 DIAGNOSIS — S82832A Other fracture of upper and lower end of left fibula, initial encounter for closed fracture: Secondary | ICD-10-CM

## 2016-02-06 MED ORDER — ACETAMINOPHEN 325 MG PO TABS
650.0000 mg | ORAL_TABLET | ORAL | Status: DC | PRN
  Administered 2016-02-06: 650 mg via ORAL
  Filled 2016-02-06: qty 2

## 2016-02-06 MED ORDER — KETOROLAC TROMETHAMINE 60 MG/2ML IM SOLN: 60.0000 mg | Freq: Once | INTRAMUSCULAR | Status: DC

## 2016-02-06 NOTE — ED Triage Notes (Signed)
Pt was on her morning walk and she tripped on a small branch and fell to the concrete.  She injured her left ankle and has been unable to bear weight on it since the fall.  She also has an abrasion on her right knee and a small scrape to the right elbow.

## 2016-02-06 NOTE — ED Notes (Signed)
Patient transported to X-ray 

## 2016-02-06 NOTE — ED Provider Notes (Signed)
[facility] Provider Note   CSN: [id number] Arrival date & time: 02/06/16  [id number]     History   Chief Complaint Chief Complaint  Patient presents with  . Fall    HPI Christine Rodgers is a 55 y.o. female.  HPI Patient with ground-level trip and fall this morning. States she believes she inverted her left foot. He has had left ankle pain and swelling since. She also sustained abrasion to her right knee and right elbow. Denied any head or neck injury. No loss of consciousness. States she's been unable to bear weight on her left ankle since onset of injury. No numbness or weakness. Patient denies being on any blood thinners. Past Medical History:  Diagnosis Date  . Allergy   . Anemia    occasional low hbg  . Arthritis    knees  . Asthma   . Dysrhythmia   . Eczema   . Family history of anesthesia complication    father   . Fibromyalgia   . History of chicken pox   . Hyperlipidemia   . Pneumonia    3-4 years ago    Patient Active Problem List   Diagnosis Date Noted  . Right calf pain 10/05/2013  . Sinusitis 05/27/2013  . Palpitations 01/05/2013  . Routine general medical examination at a health care facility 01/12/2012  . Amenorrhea 01/12/2012  . Mole of skin 01/12/2012  . Hearing problem 01/12/2012  . Knee pain, right 10/29/2011  . Hematochezia 10/29/2011  . ARTHRITIS 04/17/2010  . ALLERGY 04/17/2010  . CHICKENPOX, HX OF 04/17/2010  . HYPERLIPIDEMIA 02/28/2010  . ECZEMA 02/27/2010  . CHEST PAIN, ATYPICAL 02/27/2010    Past Surgical History:  Procedure Laterality Date  . BUNIONECTOMY  2008   right foot  . CERVICAL BIOPSY     benign  . KNEE ARTHROSCOPY Left 01/23/2014   Procedure: ARTHROSCOPY KNEE;  Surgeon: Vickey Huger, MD;  Location: [facility];  Service: Orthopedics;  Laterality: Left;    OB History    No data available       Home Medications    Prior to Admission medications   Medication Sig Start Date End Date Taking? Authorizing Provider   acetaminophen (TYLENOL) 500 MG tablet Take 1,000 mg by mouth daily as needed (for knee pain).   Yes Historical Provider, MD  albuterol (PROVENTIL HFA;VENTOLIN HFA) 108 (90 BASE) MCG/ACT inhaler Inhale 2 puffs into the lungs every 6 (six) hours as needed for wheezing. 10/29/11  Yes Debbrah Alar, NP  ergocalciferol (DRISDOL) 8000 UNIT/ML drops Take 8,000 Units by mouth daily.   Yes Historical Provider, MD  hydrocortisone cream 1 % Apply 1 application topically daily.   Yes Historical Provider, MD  levonorgestrel (MIRENA) 20 MCG/24HR IUD 1 each by Intrauterine route once.     Yes Historical Provider, MD  Lysine 1000 MG TABS Take by mouth.   Yes Historical Provider, MD  Multiple Vitamin (MULTIVITAMIN) tablet Take 1 tablet by mouth daily.   Yes Historical Provider, MD  Omega-3 Fatty Acids (FISH OIL PO) Take by mouth daily as needed.    Yes Historical Provider, MD  cephALEXin (KEFLEX) 500 MG capsule Take 1 capsule (500 mg total) by mouth 2 (two) times daily. 08/02/15   Percell Miller Saguier, PA-C  fluconazole (DIFLUCAN) 150 MG tablet Take 1 tablet (150 mg total) by mouth once. 08/02/15   Percell Miller Saguier, PA-C  fluticasone (FLONASE) 50 MCG/ACT nasal spray Place 2 sprays into both nostrils daily. Patient not taking: Reported on 02/06/2016  08/02/15   Percell Miller Saguier, PA-C  HYDROmorphone (DILAUDID) 2 MG tablet Take 1 tablet (2 mg total) by mouth every 4 (four) hours as needed. Patient not taking: Reported on 02/06/2016 01/23/14   Allen Norris, PA-C  ibuprofen (MOTRIN IB) 200 MG tablet Take 3 tablets (600 mg total) by mouth 3 (three) times daily. Patient not taking: Reported on 02/06/2016 01/23/14   Carlynn Spry, PA-C  OVER THE COUNTER MEDICATION Apply 1 application topically 2 (two) times daily.    Historical Provider, MD    Family History Family History  Problem Relation Age of Onset  . Hyperlipidemia Mother   . Hyperlipidemia Father     Deceased 54- sepsis/?pneumonia  . Arrhythmia Father   . Diabetes  Paternal Grandmother   . Stroke Paternal Grandmother   . Stroke Paternal Grandfather   . Ulcerative colitis Daughter   . Diabetes Maternal Grandmother   . Diabetes Maternal Grandfather   . Colon cancer Neg Hx   . Stomach cancer Neg Hx     Social History Social History  Substance Use Topics  . Smoking status: Former Smoker    Packs/day: 0.50    Years: 2.00    Types: Cigarettes    Quit date: 01/18/2004  . Smokeless tobacco: Never Used  . Alcohol use 4.2 oz/week    7 Glasses of wine per week     Comment: occ     Allergies   Aspirin; Codeine; Doxycycline hyclate; Penicillins; Sulfonamide derivatives; and Levaquin [levofloxacin in d5w]   Review of Systems Review of Systems  Constitutional: Negative for chills and fever.  HENT: Negative for facial swelling.   Gastrointestinal: Negative for abdominal pain.  Musculoskeletal: Positive for arthralgias and joint swelling. Negative for neck pain.  Skin: Positive for wound.  Neurological: Negative for syncope, weakness and numbness.  All other systems reviewed and are negative.    Physical Exam Updated Vital Signs BP 148/90 (BP Location: Right Arm)   Pulse 97   Temp 98.8 F (37.1 C) (Oral)   Resp 18   Ht 5\' 7"  (1.702 m)   Wt 225 lb (102.1 kg)   LMP 05/19/2013   SpO2 97%   BMI 35.24 kg/m   Physical Exam  Constitutional: She is oriented to person, place, and time. She appears well-developed and well-nourished. No distress.  HENT:  Head: Normocephalic and atraumatic.  Mouth/Throat: Oropharynx is clear and moist.  Eyes: EOM are normal. Pupils are equal, round, and reactive to light.  Neck: Normal range of motion. Neck supple.  No posterior midline cervical tenderness to palpation.  Cardiovascular: Normal rate and regular rhythm.   Pulmonary/Chest: Effort normal.  Abdominal: Soft. There is no tenderness. There is no rebound and no guarding.  Musculoskeletal: She exhibits edema and tenderness. She exhibits no deformity.    Patient has tenderness to palpation of the lateral malleolus of the left ankle. Swelling is present. No proximal fibular tenderness. 2+ dorsalis pedis and posterior tibial pulses. Full range of motion of bilateral hips and bilateral knees. Limited range of motion of the left ankle due to pain.  Neurological: She is alert and oriented to person, place, and time.  Sensation is intact. 5/5 motor in all extremities.  Skin: Skin is warm and dry. No rash noted. No erythema.  Abrasion over the right knee and mild abrasion over the right elbow. No underlying swelling, deformity.  Psychiatric: She has a normal mood and affect. Her behavior is normal.  Nursing note and vitals reviewed.    ED  Treatments / Results  Labs (all labs ordered are listed, but only abnormal results are displayed) Labs Reviewed - No data to display  EKG  EKG Interpretation None       Radiology Dg Ankle Complete Left  Result Date: 02/06/2016 CLINICAL DATA:  Pain following rolling type injury EXAM: LEFT ANKLE COMPLETE - 3+ VIEW COMPARISON:  None. FINDINGS: Frontal, oblique, and lateral views were obtained. There is marked soft tissue swelling laterally. There is a transversely oriented fracture through the physis of the distal fibula with alignment near anatomic. No other fracture is evident. There is a small joint effusion. The ankle mortise appears intact. There is narrowing in the medial aspect of the ankle joint. There are spurs arising from the posterior and inferior calcaneus. IMPRESSION: Fracture distal fibular physis with alignment near anatomic. Small joint effusion. Marked soft tissue swelling laterally. The ankle mortise appears intact. 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 Electronically Signed   By: Lowella Grip III M.D.   On: 02/06/2016 07:30    Procedures Procedures (including critical care time)  Medications Ordered in ED Medications   acetaminophen (TYLENOL) tablet 650 mg (650 mg Oral Given 02/06/16 0755)     Initial Impression / Assessment and Plan / ED Course  I have reviewed the triage vital signs and the nursing notes.  Pertinent labs & imaging results that were available during my care of the patient were reviewed by me and considered in my medical decision making (see chart for details).  Clinical Course    Patient placed in cam boot. She has crutches. Weightbearing as tolerated. Advised to follow-up with her orthopedist or with the orthopedist on-call within 1 week to be reevaluated. Return precautions given.  Final Clinical Impressions(s) / ED Diagnoses   Final diagnoses:  Closed fracture of distal end of left fibula, unspecified fracture morphology, initial encounter  Knee abrasion, right, initial encounter    New Prescriptions New Prescriptions   No medications on file     Julianne Rice, MD 02/06/16 [phone]

## 2016-04-03 ENCOUNTER — Ambulatory Visit (INDEPENDENT_AMBULATORY_CARE_PROVIDER_SITE_OTHER): Payer: Commercial Managed Care - HMO | Admitting: Orthopaedic Surgery

## 2016-04-03 DIAGNOSIS — S82832D Other fracture of upper and lower end of left fibula, subsequent encounter for closed fracture with routine healing: Secondary | ICD-10-CM | POA: Diagnosis not present

## 2016-05-13 ENCOUNTER — Ambulatory Visit (INDEPENDENT_AMBULATORY_CARE_PROVIDER_SITE_OTHER): Payer: Commercial Managed Care - HMO | Admitting: Orthopaedic Surgery

## 2016-05-13 ENCOUNTER — Encounter (INDEPENDENT_AMBULATORY_CARE_PROVIDER_SITE_OTHER): Payer: Self-pay | Admitting: Orthopaedic Surgery

## 2016-05-13 ENCOUNTER — Ambulatory Visit (INDEPENDENT_AMBULATORY_CARE_PROVIDER_SITE_OTHER): Payer: Commercial Managed Care - HMO

## 2016-05-13 VITALS — Ht 67.0 in | Wt 230.0 lb

## 2016-05-13 DIAGNOSIS — M25572 Pain in left ankle and joints of left foot: Secondary | ICD-10-CM | POA: Diagnosis not present

## 2016-05-13 NOTE — Progress Notes (Signed)
Patient follows up today for her Weber a ankle fracture. She is doing well overall. She still has a little bit of weakness. She does not have any significant pain.  Physical exam of the left ankle shows no tenderness palpation over the distal fibula. There is no swelling. She has normal motor and sensory function.  Three-view x-rays of the left ankle shows a healed Weber a ankle fracture  At this point patient has reached MMI. She will continue to work on strengthening of her ankle see her back as needed. Questions encouraged and answered.

## 2016-06-03 ENCOUNTER — Other Ambulatory Visit: Payer: Self-pay | Admitting: Family

## 2016-06-03 ENCOUNTER — Other Ambulatory Visit: Payer: Self-pay | Admitting: Certified Nurse Midwife

## 2016-06-03 DIAGNOSIS — R921 Mammographic calcification found on diagnostic imaging of breast: Secondary | ICD-10-CM

## 2016-06-11 ENCOUNTER — Ambulatory Visit
Admission: RE | Admit: 2016-06-11 | Discharge: 2016-06-11 | Disposition: A | Discharge status: Home / Self Care | Payer: Commercial Managed Care - HMO | Source: Ambulatory Visit | Attending: Family | Admitting: Family

## 2016-06-11 DIAGNOSIS — R921 Mammographic calcification found on diagnostic imaging of breast: Secondary | ICD-10-CM

## 2017-05-01 DIAGNOSIS — N951 Menopausal and female climacteric states: Secondary | ICD-10-CM | POA: Diagnosis not present

## 2017-08-06 ENCOUNTER — Ambulatory Visit: Payer: 59 | Admitting: Family

## 2017-08-06 ENCOUNTER — Encounter: Payer: Self-pay | Admitting: Family

## 2017-08-06 ENCOUNTER — Telehealth: Payer: Self-pay

## 2017-08-06 VITALS — Temp 98.0°F | Resp 16 | Ht 67.0 in | Wt 245.0 lb

## 2017-08-06 DIAGNOSIS — J019 Acute sinusitis, unspecified: Secondary | ICD-10-CM | POA: Diagnosis not present

## 2017-08-06 MED ORDER — CEFDINIR 300 MG PO CAPS: 300.0000 mg | ORAL_CAPSULE | Freq: Two times a day (BID) | ORAL | 0 refills | Status: DC

## 2017-08-06 MED ORDER — MOMETASONE FUROATE 50 MCG/ACT NA SUSP: 2.0000 | Freq: Every day | NASAL | 12 refills | Status: DC

## 2017-08-06 MED ORDER — MOMETASONE FUROATE 50 MCG/ACT NA SUSP: 2.0000 | Freq: Every day | NASAL | 0 refills | Status: DC

## 2017-08-06 MED FILL — CEFDINIR 300 MG CAPS: 300 | 10 days supply | Qty: 20 | Fill #0

## 2017-08-06 NOTE — Telephone Encounter (Signed)
PA denied. Plan exclusion. Nasonex available OTC.

## 2017-08-06 NOTE — Patient Instructions (Addendum)
Please begin cefdinir (antibiotic) for sinus infection. Begin nasonex spray. Begin claritin 10mg  once daily.  Call if new/worsening symptoms or if not improved in 3-4 days.

## 2017-08-06 NOTE — Telephone Encounter (Signed)
PA initiated via Covermymeds; KEY: [id number]. Awaiting determination.

## 2017-08-06 NOTE — Progress Notes (Signed)
Subjective:    Patient ID: Christine Rodgers, female    DOB: 08-15-1960, 57 y.o.   MRN: [id number]  HPI  Patient is a 57 yr old female with c/o dizziness. Woke up with it on 2/16. Had associated nausea.  For 2 weeks prior she had right eye watering right sided sinus pressure and right sided ear pain. Took sudafed, drank lots of water.  Symptoms mostly improved.  She was able to return to work. Last night she did not take the sudafed because she felt so jittery. Woke up with right jaw/eye/cheek discomfort.  Reports associated dizziness today.   Review of Systems See HPI  Past Medical History:  Diagnosis Date  . Allergy   . Anemia    occasional low hbg  . Arthritis    knees  . Asthma   . Dysrhythmia   . Eczema   . Family history of anesthesia complication    father   . Fibromyalgia   . History of chicken pox   . Hyperlipidemia   . Pneumonia    3-4 years ago     Social History   Socioeconomic History  . Marital status: Married    Spouse name: Not on file  . Number of children: 3  . Years of education: Not on file  . Highest education level: Not on file  Social Needs  . Financial resource strain: Not on file  . Food insecurity - worry: Not on file  . Food insecurity - inability: Not on file  . Transportation needs - medical: Not on file  . Transportation needs - non-medical: Not on file  Occupational History  . Occupation: EXECUTIVE ADMIN [employer]    Employer: UNEMPLOYED  Tobacco Use  . Smoking status: Former Smoker    Packs/day: 0.50    Years: 2.00    Pack years: 1.00    Types: Cigarettes    Last attempt to quit: 01/18/2004    Years since quitting: 13.5  . Smokeless tobacco: Never Used  Substance and Sexual Activity  . Alcohol use: Yes    Alcohol/week: 4.2 oz    Types: 7 Glasses of wine per week    Comment: occ  . Drug use: No  . Sexual activity: Not on file  Other Topics Concern  . Not on file  Social History Narrative   Regular exercise:  No   Caffeine use:   2 mugs of coffee daily                Past Surgical History:  Procedure Laterality Date  . BUNIONECTOMY  2008   right foot  . CERVICAL BIOPSY     benign  . KNEE ARTHROSCOPY Left 01/23/2014   Procedure: ARTHROSCOPY KNEE;  Surgeon: Vickey Huger, MD;  Location: [facility];  Service: Orthopedics;  Laterality: Left;    Family History  Problem Relation Age of Onset  . Hyperlipidemia Mother   . Hyperlipidemia Father        Deceased 19- sepsis/?pneumonia  . Arrhythmia Father   . Diabetes Paternal Grandmother   . Stroke Paternal Grandmother   . Stroke Paternal Grandfather   . Ulcerative colitis Daughter   . Diabetes Maternal Grandmother   . Diabetes Maternal Grandfather   . Colon cancer Neg Hx   . Stomach cancer Neg Hx     Allergies  Allergen Reactions  . Aspirin     REACTION: severe nausea  . Codeine     REACTION: severe nausea  . Doxycycline Hyclate  REACTION: hives, throat swelling  . Penicillins     REACTION: passed out  . Sulfonamide Derivatives     REACTION: diarrhea, vomitting  . Levaquin [Levofloxacin In D5w] Rash    Current Outpatient Medications on File Prior to Visit  Medication Sig Dispense Refill  . acetaminophen (TYLENOL) 500 MG tablet Take 1,000 mg by mouth daily as needed (for knee pain).    Marland Kitchen albuterol (PROVENTIL HFA;VENTOLIN HFA) 108 (90 BASE) MCG/ACT inhaler Inhale 2 puffs into the lungs every 6 (six) hours as needed for wheezing. 1 Inhaler 0  . ergocalciferol (DRISDOL) 8000 UNIT/ML drops Take 8,000 Units by mouth daily.    . hydrocortisone cream 1 % Apply 1 application topically daily.    Marland Kitchen levonorgestrel (MIRENA) 20 MCG/24HR IUD 1 each by Intrauterine route once.      Marland Kitchen Lysine 1000 MG TABS Take by mouth.    . Multiple Vitamin (MULTIVITAMIN) tablet Take 1 tablet by mouth daily.    . Omega-3 Fatty Acids (FISH OIL PO) Take by mouth daily as needed.      No current facility-administered medications on file prior to visit.     Temp 98 F (36.7 C)  (Oral)   Resp 16   Ht 5\' 7"  (1.702 m)   Wt 245 lb (111.1 kg)   LMP 05/22/2013 Comment: perimenopausal  SpO2 98%   BMI 38.37 kg/m       Objective:   Physical Exam  Constitutional: She is oriented to person, place, and time. She appears well-developed and well-nourished.  HENT:  Head: Normocephalic and atraumatic.  Nose: Right sinus exhibits maxillary sinus tenderness and frontal sinus tenderness.  Mouth/Throat: Oropharynx is clear and moist.  R TM dull with serous effusion, no erythema  Cardiovascular: Normal rate, regular rhythm and normal heart sounds.  No murmur heard. Pulmonary/Chest: Effort normal and breath sounds normal. No respiratory distress. She has no wheezes.  Neurological: She is alert and oriented to person, place, and time.  Psychiatric: She has a normal mood and affect. Her behavior is normal. Judgment and thought content normal.          Assessment & Plan:  Acute sinusitis- will rx with cefdinir, claritin, nasonex. She is advised to call if new/worsening symptoms or if not improved in 3-4 days.

## 2017-08-17 ENCOUNTER — Ambulatory Visit: Payer: 59 | Admitting: Family

## 2017-08-17 VITALS — BP 144/88 | HR 108 | Temp 98.6°F

## 2017-08-17 DIAGNOSIS — R42 Dizziness and giddiness: Secondary | ICD-10-CM

## 2017-08-17 DIAGNOSIS — R002 Palpitations: Secondary | ICD-10-CM | POA: Diagnosis not present

## 2017-08-17 DIAGNOSIS — J019 Acute sinusitis, unspecified: Secondary | ICD-10-CM

## 2017-08-17 DIAGNOSIS — H6690 Otitis media, unspecified, unspecified ear: Secondary | ICD-10-CM

## 2017-08-17 MED ORDER — MECLIZINE HCL 25 MG PO TABS: 25.0000 mg | ORAL_TABLET | Freq: Three times a day (TID) | ORAL | 0 refills | Status: DC | PRN

## 2017-08-17 MED ORDER — PREDNISONE 1 MG PO TABS: ORAL_TABLET | ORAL | 0 refills | Status: DC

## 2017-08-17 MED ORDER — CEFDINIR 300 MG PO CAPS: 300.0000 mg | ORAL_CAPSULE | Freq: Two times a day (BID) | ORAL | 0 refills | Status: DC

## 2017-08-17 NOTE — Patient Instructions (Addendum)
Stop sudafed due to palpitations and since it is not helping you.  cefdinir for sinus infection and right ear infection. Start prednisone once daily for 5 days. You may use meclizine as needed for dizziness. If symptoms are not improved in 3 days please contact us.

## 2017-08-17 NOTE — Progress Notes (Signed)
Subjective:    Patient ID: Christine Rodgers, female    DOB: Mar 10, 1961, 57 y.o.   MRN: [id number]  HPI  Patient is a 57 year old female who presents today with chief complaint of dizziness.  She was last seen on 08/06/2017.  At that visit she had complaints of dizziness as well as sinus symptoms.  She was treated for acute sinusitis with cefdinir, Claritin and Nasonex.   Today she reports intermittent dizziness which is been present for 2 weeks.  She reports that she has worsening dizziness and headache today which caused her to have to leave work early.  She has been taking Sudafed for congestion has noted increased palpitations recently.  Reports that she started otc nasonex.  2 days in her dizziness came back.  Reports that she was using zyrtec and it was not working. Went back on sudafed and symptoms improved. Over the weekend "started to feel bad" dizziness/pressure in face/ear and behind my eye. "reports so much pressure."  She denies fever.  + nausea.    Did 1 night monistat. Used coconut/teatree oil which helped.    Review of Systems See HPI  Past Medical History:  Diagnosis Date  . Allergy   . Anemia    occasional low hbg  . Arthritis    knees  . Asthma   . Dysrhythmia   . Eczema   . Family history of anesthesia complication    father   . Fibromyalgia   . History of chicken pox   . Hyperlipidemia   . Pneumonia    3-4 years ago     Social History   Socioeconomic History  . Marital status: Married    Spouse name: Not on file  . Number of children: 3  . Years of education: Not on file  . Highest education level: Not on file  Social Needs  . Financial resource strain: Not on file  . Food insecurity - worry: Not on file  . Food insecurity - inability: Not on file  . Transportation needs - medical: Not on file  . Transportation needs - non-medical: Not on file  Occupational History  . Occupation: EXECUTIVE [employer]    Employer: UNEMPLOYED  Tobacco Use  .  Smoking status: Former Smoker    Packs/day: 0.50    Years: 2.00    Pack years: 1.00    Types: Cigarettes    Last attempt to quit: 01/18/2004    Years since quitting: 13.5  . Smokeless tobacco: Never Used  Substance and Sexual Activity  . Alcohol use: Yes    Alcohol/week: 4.2 oz    Types: 7 Glasses of wine per week    Comment: occ  . Drug use: No  . Sexual activity: Not on file  Other Topics Concern  . Not on file  Social History Narrative   Regular exercise:  No   Caffeine use:  2 mugs of coffee daily                Past Surgical History:  Procedure Laterality Date  . BUNIONECTOMY  2008   right foot  . CERVICAL BIOPSY     benign  . KNEE ARTHROSCOPY Left 01/23/2014   Procedure: ARTHROSCOPY KNEE;  Surgeon: Vickey Huger, MD;  Location: [place];  Service: Orthopedics;  Laterality: Left;    Family History  Problem Relation Age of Onset  . Hyperlipidemia Mother   . Hyperlipidemia Father        Deceased 70- sepsis/?pneumonia  .  Arrhythmia Father   . Diabetes Paternal Grandmother   . Stroke Paternal Grandmother   . Stroke Paternal Grandfather   . Ulcerative colitis Daughter   . Diabetes Maternal Grandmother   . Diabetes Maternal Grandfather   . Colon cancer Neg Hx   . Stomach cancer Neg Hx     Allergies  Allergen Reactions  . Aspirin     REACTION: severe nausea  . Codeine     REACTION: severe nausea  . Doxycycline Hyclate     REACTION: hives, throat swelling  . Penicillins     REACTION: passed out  . Sulfonamide Derivatives     REACTION: diarrhea, vomitting  . Levaquin [Levofloxacin In D5w] Rash    Current Outpatient Medications on File Prior to Visit  Medication Sig Dispense Refill  . acetaminophen (TYLENOL) 500 MG tablet Take 1,000 mg by mouth daily as needed (for knee pain).    Marland Kitchen albuterol (PROVENTIL HFA;VENTOLIN HFA) 108 (90 BASE) MCG/ACT inhaler Inhale 2 puffs into the lungs every 6 (six) hours as needed for wheezing. 1 Inhaler 0  . ergocalciferol  (DRISDOL) 8000 UNIT/ML drops Take 8,000 Units by mouth daily.    . hydrocortisone cream 1 % Apply 1 application topically daily.    Marland Kitchen levonorgestrel (MIRENA) 20 MCG/24HR IUD 1 each by Intrauterine route once.      Marland Kitchen Lysine 1000 MG TABS Take by mouth.    . mometasone (NASONEX) 50 MCG/ACT nasal spray Place 2 sprays into the nose daily. 17 g 0  . Multiple Vitamin (MULTIVITAMIN) tablet Take 1 tablet by mouth daily.    . Omega-3 Fatty Acids (FISH OIL PO) Take by mouth daily as needed.      No current facility-administered medications on file prior to visit.     BP (!) 144/88 (BP Location: Right Arm, Cuff Size: Large)   Pulse (!) 108   Temp 98.6 F (37 C) (Oral)   LMP 05/22/2013 Comment: perimenopausal  SpO2 99%       Objective:   Physical Exam  Constitutional: She is oriented to person, place, and time. She appears well-developed and well-nourished. No distress.  HENT:  Head: Normocephalic and atraumatic.  Right Ear: Tympanic membrane is erythematous and bulging.  Left Ear: Tympanic membrane and ear canal normal.  Nose: Right sinus exhibits maxillary sinus tenderness and frontal sinus tenderness. Left sinus exhibits no maxillary sinus tenderness and no frontal sinus tenderness.  Eyes: EOM are normal. Pupils are equal, round, and reactive to light.  Cardiovascular: Normal rate and regular rhythm.  No murmur heard. Pulmonary/Chest: Effort normal and breath sounds normal. No respiratory distress. She has no wheezes. She has no rales. She exhibits no tenderness.  Neurological: She is alert and oriented to person, place, and time. She exhibits normal muscle tone.  Skin: Skin is warm and dry.  Psychiatric: She has a normal mood and affect. Her behavior is normal. Judgment and thought content normal.          Assessment & Plan:  Right-sided otitis media/right sided sinusitis-her allergy profile makes antibiotic choice is extremely limited.  She is unable to take doxycycline,  erythromycin, penicillins, sulfa, or Levaquin.  She did tolerate Ceftin ear.  Will repeat course of Ceftin ear and also give her a short burst of oral prednisone.  We discussed that if symptoms worsen or fail to improve she will need referral to ENT.  Vertigo-likely exacerbated by her otitis media.  A prescription has been provided for as needed meclizine.  Her  neuro exam today is normal.  I did suggest that she have her husband come pick her up today since she is dizzy and it is unsafe for her to drive.  She is agreeable to this.  Palpitations- likely secondary to sudafed- d/c sudafed. EKG tracing is personally reviewed.  EKG notes NSR.  No acute changes.

## 2017-08-21 ENCOUNTER — Telehealth: Payer: Self-pay | Admitting: Family

## 2017-08-21 DIAGNOSIS — J019 Acute sinusitis, unspecified: Secondary | ICD-10-CM

## 2017-08-21 DIAGNOSIS — R42 Dizziness and giddiness: Secondary | ICD-10-CM

## 2017-08-21 NOTE — Telephone Encounter (Signed)
Copied from [place]. Topic: Referral - Request >> Aug 21, 2017 12:14 PM Darl Householder, RMA wrote: Reason for CRM: Patient is requesting a referral for ENT per [facility] due to dizziness and pressure in face and ear

## 2017-09-22 DIAGNOSIS — H9313 Tinnitus, bilateral: Secondary | ICD-10-CM | POA: Diagnosis not present

## 2017-09-22 DIAGNOSIS — J0191 Acute recurrent sinusitis, unspecified: Secondary | ICD-10-CM | POA: Diagnosis not present

## 2017-09-22 DIAGNOSIS — H8112 Benign paroxysmal vertigo, left ear: Secondary | ICD-10-CM | POA: Diagnosis not present

## 2018-02-01 DIAGNOSIS — N912 Amenorrhea, unspecified: Secondary | ICD-10-CM | POA: Diagnosis not present

## 2018-02-01 DIAGNOSIS — Z01419 Encounter for gynecological examination (general) (routine) without abnormal findings: Secondary | ICD-10-CM | POA: Diagnosis not present

## 2018-08-31 ENCOUNTER — Other Ambulatory Visit: Payer: Self-pay

## 2018-08-31 ENCOUNTER — Encounter: Payer: Self-pay | Admitting: Medical

## 2018-08-31 ENCOUNTER — Ambulatory Visit (INDEPENDENT_AMBULATORY_CARE_PROVIDER_SITE_OTHER): Payer: 59 | Admitting: Medical

## 2018-08-31 VITALS — BP 145/96 | HR 105 | Temp 98.1°F | Resp 18 | Wt 243.2 lb

## 2018-08-31 DIAGNOSIS — R059 Cough, unspecified: Secondary | ICD-10-CM

## 2018-08-31 DIAGNOSIS — R05 Cough: Secondary | ICD-10-CM

## 2018-08-31 DIAGNOSIS — M791 Myalgia, unspecified site: Secondary | ICD-10-CM

## 2018-08-31 DIAGNOSIS — R61 Generalized hyperhidrosis: Secondary | ICD-10-CM

## 2018-08-31 DIAGNOSIS — R509 Fever, unspecified: Secondary | ICD-10-CM | POA: Diagnosis not present

## 2018-08-31 LAB — POC INFLUENZA A&B (BINAX/QUICKVUE)
INFLUENZA A, POC: NEGATIVE
INFLUENZA B, POC: NEGATIVE

## 2018-08-31 MED ORDER — FLUTICASONE PROPIONATE 50 MCG/ACT NA SUSP: 2.0000 | Freq: Every day | NASAL | 1 refills | Status: DC

## 2018-08-31 MED ORDER — ALBUTEROL SULFATE HFA 108 (90 BASE) MCG/ACT IN AERS: 2.0000 | INHALATION_SPRAY | Freq: Four times a day (QID) | RESPIRATORY_TRACT | 2 refills | Status: DC | PRN

## 2018-08-31 MED ORDER — BENZONATATE 100 MG PO CAPS: 100.0000 mg | ORAL_CAPSULE | Freq: Three times a day (TID) | ORAL | 0 refills | Status: DC | PRN

## 2018-08-31 NOTE — Patient Instructions (Addendum)
You describe symptoms over the past 2 to 3 days of dry cough, myalgias, chills and fever today of 100.6 degrees.  Prior to readings before that readings were 100.1 and 100.4.  In addition you report some slight sensation of lung field tightness/shortness of breath.  History of wheezing with illnesses in the past.  Your flu test in our office was negative.  With above recent symptoms, I did go ahead and place covid testing ordered to be done at the drive up testing site at [address].  If you do get testing then you will need to be self quarantine until test results are back.  For cough, I am making benzonatate available.  If you start to have wheezing, then making albuterol inhaler available.  If you develop a nasal congestion, I prescribed Flonase.  We will go ahead and get medications today.  If you do get a test and then go to quarantine let us know if you have any worsening or changing signs and symptoms.  We could direct you for further evaluation.  However would like to give that facility/ED has not noticed as to your arrival in the event that were to recur.  Follow-up date to be determined.     Person Under Monitoring Name: Christine Rodgers  Location: [address] [place] [address]   Infection Prevention Recommendations for Individuals Confirmed to have, or Being Evaluated for, 2019 Novel Coronavirus (COVID-19) Infection Who Receive Care at Home  Individuals who are confirmed to have, or are being evaluated for, COVID-19 should follow the prevention steps below until a healthcare provider or local or state health department says they can return to normal activities.  Stay home except to get medical care You should restrict activities outside your home, except for getting medical care. Do not go to work, school, or public areas, and do not use public transportation or taxis.  Call ahead before visiting your doctor Before your medical appointment, call the  healthcare provider and tell them that you have, or are being evaluated for, COVID-19 infection. This will help the healthcare provider's office take steps to keep other people from getting infected. Ask your healthcare provider to call the local or state health department.  Monitor your symptoms Seek prompt medical attention if your illness is worsening (e.g., difficulty breathing). Before going to your medical appointment, call the healthcare provider and tell them that you have, or are being evaluated for, COVID-19 infection. Ask your healthcare provider to call the local or state health department.  Wear a facemask You should wear a facemask that covers your nose and mouth when you are in the same room with other people and when you visit a healthcare provider. People who live with or visit you should also wear a facemask while they are in the same room with you.  Separate yourself from other people in your home As much as possible, you should stay in a different room from other people in your home. Also, you should use a separate bathroom, if available.  Avoid sharing household items You should not share dishes, drinking glasses, cups, eating utensils, towels, bedding, or other items with other people in your home. After using these items, you should wash them thoroughly with soap and water.  Cover your coughs and sneezes Cover your mouth and nose with a tissue when you cough or sneeze, or you can cough or sneeze into your sleeve. Throw used tissues in a lined trash can, and  immediately wash your hands with soap and water for at least 20 seconds or use an alcohol-based hand rub.  Wash your [facility] your hands often and thoroughly with soap and water for at least 20 seconds. You can use an alcohol-based hand sanitizer if soap and water are not available and if your hands are not visibly dirty. Avoid touching your eyes, nose, and mouth with unwashed hands.      Person Under  Monitoring Name: Christine Rodgers  Location: [address] [place] [address]   Infection Prevention Recommendations for Individuals Confirmed to have, or Being Evaluated for, 2019 Novel Coronavirus (COVID-19) Infection Who Receive Care at Home  Individuals who are confirmed to have, or are being evaluated for, COVID-19 should follow the prevention steps below until a healthcare provider or local or state health department says they can return to normal activities.  Stay home except to get medical care You should restrict activities outside your home, except for getting medical care. Do not go to work, school, or public areas, and do not use public transportation or taxis.  Call ahead before visiting your doctor Before your medical appointment, call the healthcare provider and tell them that you have, or are being evaluated for, COVID-19 infection. This will help the healthcare provider's office take steps to keep other people from getting infected. Ask your healthcare provider to call the local or state health department.  Monitor your symptoms Seek prompt medical attention if your illness is worsening (e.g., difficulty breathing). Before going to your medical appointment, call the healthcare provider and tell them that you have, or are being evaluated for, COVID-19 infection. Ask your healthcare provider to call the local or state health department.  Wear a facemask You should wear a facemask that covers your nose and mouth when you are in the same room with other people and when you visit a healthcare provider. People who live with or visit you should also wear a facemask while they are in the same room with you.  Separate yourself from other people in your home As much as possible, you should stay in a different room from other people in your home. Also, you should use a separate bathroom, if available.  Avoid sharing household items You should not share dishes,  drinking glasses, cups, eating utensils, towels, bedding, or other items with other people in your home. After using these items, you should wash them thoroughly with soap and water.  Cover your coughs and sneezes Cover your mouth and nose with a tissue when you cough or sneeze, or you can cough or sneeze into your sleeve. Throw used tissues in a lined trash can, and immediately wash your hands with soap and water for at least 20 seconds or use an alcohol-based hand rub.  Wash your [facility] your hands often and thoroughly with soap and water for at least 20 seconds. You can use an alcohol-based hand sanitizer if soap and water are not available and if your hands are not visibly dirty. Avoid touching your eyes, nose, and mouth with unwashed hands.   Prevention Steps for Caregivers and Household Members of Individuals Confirmed to have, or Being Evaluated for, COVID-19 Infection Being Cared for in the Home  If you live with, or provide care at home for, a person confirmed to have, or being evaluated for, COVID-19 infection please follow these guidelines to prevent infection:  Follow healthcare provider's instructions Make sure that you understand and can help  the patient follow any healthcare provider instructions for all care.  Provide for the patient's basic needs You should help the patient with basic needs in the home and provide support for getting groceries, prescriptions, and other personal needs.  Monitor the patient's symptoms If they are getting sicker, call his or her medical provider and tell them that the patient has, or is being evaluated for, COVID-19 infection. This will help the healthcare provider's office take steps to keep other people from getting infected. Ask the healthcare provider to call the local or state health department.  Limit the number of people who have contact with the patient  If possible, have only one caregiver for the patient.  Other  household members should stay in another home or place of residence. If this is not possible, they should stay  in another room, or be separated from the patient as much as possible. Use a separate bathroom, if available.  Restrict visitors who do not have an essential need to be in the home.  Keep older adults, very young children, and other sick people away from the patient Keep older adults, very young children, and those who have compromised immune systems or chronic health conditions away from the patient. This includes people with chronic heart, lung, or kidney conditions, diabetes, and cancer.  Ensure good ventilation Make sure that shared spaces in the home have good air flow, such as from an air conditioner or an opened window, weather permitting.  Wash your hands often  Wash your hands often and thoroughly with soap and water for at least 20 seconds. You can use an alcohol based hand sanitizer if soap and water are not available and if your hands are not visibly dirty.  Avoid touching your eyes, nose, and mouth with unwashed hands.  Use disposable paper towels to dry your hands. If not available, use dedicated cloth towels and replace them when they become wet.  Wear a facemask and gloves  Wear a disposable facemask at all times in the room and gloves when you touch or have contact with the patient's blood, body fluids, and/or secretions or excretions, such as sweat, saliva, sputum, nasal mucus, vomit, urine, or feces.  Ensure the mask fits over your nose and mouth tightly, and do not touch it during use.  Throw out disposable facemasks and gloves after using them. Do not reuse.  Wash your hands immediately after removing your facemask and gloves.  If your personal clothing becomes contaminated, carefully remove clothing and launder. Wash your hands after handling contaminated clothing.  Place all used disposable facemasks, gloves, and other waste in a lined container before  disposing them with other household waste.  Remove gloves and wash your hands immediately after handling these items.  Do not share dishes, glasses, or other household items with the patient  Avoid sharing household items. You should not share dishes, drinking glasses, cups, eating utensils, towels, bedding, or other items with a patient who is confirmed to have, or being evaluated for, COVID-19 infection.  After the person uses these items, you should wash them thoroughly with soap and water.  Wash laundry thoroughly  Immediately remove and wash clothes or bedding that have blood, body fluids, and/or secretions or excretions, such as sweat, saliva, sputum, nasal mucus, vomit, urine, or feces, on them.  Wear gloves when handling laundry from the patient.  Read and follow directions on labels of laundry or clothing items and detergent. In general, wash and dry with the warmest  temperatures recommended on the label.  Clean all areas the individual has used often  Clean all touchable surfaces, such as counters, tabletops, doorknobs, bathroom fixtures, toilets, phones, keyboards, tablets, and bedside tables, every day. Also, clean any surfaces that may have blood, body fluids, and/or secretions or excretions on them.  Wear gloves when cleaning surfaces the patient has come in contact with.  Use a diluted bleach solution (e.g., dilute bleach with 1 part bleach and 10 parts water) or a household disinfectant with a label that says EPA-registered for coronaviruses. To make a bleach solution at home, add 1 tablespoon of bleach to 1 quart (4 cups) of water. For a larger supply, add  cup of bleach to 1 gallon (16 cups) of water.  Read labels of cleaning products and follow recommendations provided on product labels. Labels contain instructions for safe and effective use of the cleaning product including precautions you should take when applying the product, such as wearing gloves or eye protection  and making sure you have good ventilation during use of the product.  Remove gloves and wash hands immediately after cleaning.  Monitor yourself for signs and symptoms of illness Caregivers and household members are considered close contacts, should monitor their health, and will be asked to limit movement outside of the home to the extent possible. Follow the monitoring steps for close contacts listed on the symptom monitoring form.   ? If you have additional questions, contact your local health department or call the epidemiologist on call at [phone] (available 24/7). ? This guidance is subject to change. For the most up-to-date guidance from [facility], please refer to their website: [email or web address]

## 2018-08-31 NOTE — Progress Notes (Signed)
Subjective:    Patient ID: Christine Rodgers, female    DOB: [birth year], 58 y.o.   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 Pt states she highest tempt today at home was 100.6. Other checks her temp was 100. Pt just got back from [employer]. No temp initially here today when we checked. Also some contact to person who had trip to [place].   Prior at home temp 100.6 reading temp was 100.1 and 100.4.  Some mild body aches. Lungs feels slight tight. She states will wheeze in past if gets sick.   Pt cough started about 3 days ago. States cough is dry.   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 She states she wants to know if she has covid.   Review of Systems  Constitutional: Positive for diaphoresis. Negative for chills, fatigue and fever.       Sweating last 3 night. Not like hot flashes per pt.  HENT: Negative for congestion, ear pain, postnasal drip, rhinorrhea, sinus pressure, sinus pain and trouble swallowing.        Not feeling very nasal congested.  Respiratory: Negative for cough, chest tightness, shortness of breath and wheezing.   Cardiovascular: Negative for chest pain and palpitations.  Gastrointestinal: Negative for abdominal pain, anal bleeding, constipation and diarrhea.  Genitourinary: Negative for dysuria.  Musculoskeletal: Positive for myalgias. Negative for back pain.       Hips and joints ache recently.  Skin: Negative for rash.  Neurological: Negative for dizziness, seizures, weakness and headaches.  Hematological: Negative for adenopathy. Does not bruise/bleed easily.  Psychiatric/Behavioral: Negative for behavioral problems, confusion, hallucinations and sleep disturbance. The patient is not nervous/anxious.     Past Medical History:  Diagnosis Date  . Allergy   . Anemia    occasional low hbg  . Arthritis    knees  . Asthma   . Dysrhythmia   . Eczema   . Family history of anesthesia  complication    father   . Fibromyalgia   . History of chicken pox   . Hyperlipidemia   . Pneumonia    3-4 years ago     Social History   Socioeconomic History  . Marital status: Married    Spouse name: Not on file  . Number of children: 3  . Years of education: Not on file  . Highest education level: Not on file  Occupational History  . Occupation: EXECUTIVE [employer]    Employer: UNEMPLOYED  Social Needs  . Financial resource strain: Not on file  . Food insecurity:    Worry: Not on file    Inability: Not on file  . Transportation needs:    Medical: Not on file    Non-medical: Not on file  Tobacco Use  . Smoking status: Former Smoker    Packs/day: 0.50    Years: 2.00    Pack years: 1.00    Types: Cigarettes    Last attempt to quit: 01/18/2004    Years since quitting: 14.6  . Smokeless tobacco: Never Used  Substance and Sexual Activity  . Alcohol use: Yes    Alcohol/week: 7.0 standard drinks    Types: 7 Glasses of wine per week    Comment: occ  . Drug use: No  . Sexual activity: Not on file  Lifestyle  . Physical activity:    Days per week: Not on file    Minutes per  session: Not on file  . Stress: Not on file  Relationships  . Social connections:    Talks on phone: Not on file    Gets together: Not on file    Attends religious service: Not on file    Active member of club or organization: Not on file    Attends meetings of clubs or organizations: Not on file    Relationship status: Not on file  . Intimate partner violence:    Fear of current or ex partner: Not on file    Emotionally abused: Not on file    Physically abused: Not on file    Forced sexual activity: Not on file  Other Topics Concern  . Not on file  Social History Narrative   Regular exercise:  No   Caffeine use:  2 mugs of coffee daily                Past Surgical History:  Procedure Laterality Date  . BUNIONECTOMY  2008   right foot  . CERVICAL BIOPSY     benign  . KNEE  ARTHROSCOPY Left 01/23/2014   Procedure: ARTHROSCOPY KNEE;  Surgeon: Vickey Huger, MD;  Location: [facility];  Service: Orthopedics;  Laterality: Left;    Family History  Problem Relation Age of Onset  . Hyperlipidemia Mother   . Hyperlipidemia Father        Deceased 40- sepsis/?pneumonia  . Arrhythmia Father   . Diabetes Paternal Grandmother   . Stroke Paternal Grandmother   . Stroke Paternal Grandfather   . Ulcerative colitis Daughter   . Diabetes Maternal Grandmother   . Diabetes Maternal Grandfather   . Colon cancer Neg Hx   . Stomach cancer Neg Hx     Allergies  Allergen Reactions  . Aspirin     REACTION: severe nausea  . Codeine     REACTION: severe nausea  . Doxycycline Hyclate     REACTION: hives, throat swelling  . Erythromycin     Rash, gi side effects.    . Penicillins     REACTION: passed out  . Sulfonamide Derivatives     REACTION: diarrhea, vomitting  . Levaquin [Levofloxacin In D5w] Rash    Current Outpatient Medications on File Prior to Visit  Medication Sig Dispense Refill  . acetaminophen (TYLENOL) 500 MG tablet Take 1,000 mg by mouth daily as needed (for knee pain).    Marland Kitchen albuterol (PROVENTIL HFA;VENTOLIN HFA) 108 (90 BASE) MCG/ACT inhaler Inhale 2 puffs into the lungs every 6 (six) hours as needed for wheezing. 1 Inhaler 0  . cefdinir (OMNICEF) 300 MG capsule Take 1 capsule (300 mg total) by mouth 2 (two) times daily. 20 capsule 0  . ergocalciferol (DRISDOL) 8000 UNIT/ML drops Take 8,000 Units by mouth daily.    . hydrocortisone cream 1 % Apply 1 application topically daily.    Marland Kitchen levonorgestrel (MIRENA) 20 MCG/24HR IUD 1 each by Intrauterine route once.      Marland Kitchen Lysine 1000 MG TABS Take by mouth.    . meclizine (ANTIVERT) 25 MG tablet Take 1 tablet (25 mg total) by mouth 3 (three) times daily as needed for dizziness. 30 tablet 0  . mometasone (NASONEX) 50 MCG/ACT nasal spray Place 2 sprays into the nose daily. 17 g 0  . Multiple Vitamin (MULTIVITAMIN) tablet  Take 1 tablet by mouth daily.    . Omega-3 Fatty Acids (FISH OIL PO) Take by mouth daily as needed.     . predniSONE (DELTASONE) 1  MG tablet 4 tabs by mouth once daily for 5 days. 20 tablet 0   No current facility-administered medications on file prior to visit.     BP (!) 145/96   Pulse (!) 105   Temp 98.1 F (36.7 C) (Oral)   Resp 18   Wt 243 lb 3.2 oz (110.3 kg)   LMP 05/22/2013 Comment: perimenopausal  SpO2 98%   BMI 38.09 kg/m       Objective:   Physical Exam  General  Mental Status - Alert. General Appearance - Well groomed. Not in acute distress.  Skin Rashes- No Rashes.  HEENT Head- Normal. Ear Auditory Canal - Left- Normal. Right - Normal.Tympanic Membrane- Left- Normal. Right- Normal. Eye Sclera/Conjunctiva- Left- Normal. Right- Normal. Nose & Sinuses Nasal Mucosa- Left-  Boggy and Congested. Right-  Boggy and  Congested.Bilateral no  maxillary and no  frontal sinus pressure. Mouth & Throat Lips: Upper Lip- Normal: no dryness, cracking, pallor, cyanosis, or vesicular eruption. Lower Lip-Normal: no dryness, cracking, pallor, cyanosis or vesicular eruption. Buccal Mucosa- Bilateral- No Aphthous ulcers. Oropharynx- No Discharge or Erythema. Tonsils: Characteristics- Bilateral- No Erythema or Congestion. Size/Enlargement- Bilateral- No enlargement. Discharge- bilateral-None.  Neck Neck- Supple. No Masses.   Chest and Lung Exam Auscultation: Breath Sounds:-Clear even and unlabored but on deep inspirations did start brief coughing episode.  Cardiovascular Auscultation:Rythm- Regular, rate and rhythm. Murmurs & Other Heart Sounds:Ausculatation of the heart reveal- No Murmurs.  Lymphatic Head & Neck General Head & Neck Lymphatics: Bilateral: Description- No Localized lymphadenopathy.       Assessment & Plan:  You describe symptoms over the past 2 to 3 days of dry cough, myalgias, chills and fever today of 100.6 degrees.  Prior to readings before that  readings were 100.1 and 100.4.  In addition you report some slight sensation of lung field tightness/shortness of breath.  History of wheezing with illnesses in the past.  Your flu test in our office was negative.  With above recent symptoms, I did go ahead and place covid testing ordered to be done at the drive up testing site at [address].  If you do get testing then you will need to be self quarantine until test results are back.  For cough, I am making benzonatate available.  If you start to have wheezing, then making albuterol inhaler available.  If you develop a nasal congestion, I prescribed Flonase.  We will go ahead and get medications today.  If you do get a test and then go to quarantine let us know if you have any worsening or changing signs and symptoms.  We could direct you for further evaluation.  However would like to give that facility/ED has not noticed as to your arrival in the event that were to recur.  Follow-up date to be determined.

## 2018-09-02 ENCOUNTER — Telehealth: Payer: Self-pay

## 2018-09-02 NOTE — Telephone Encounter (Signed)
Copied from [place] [id number]. Topic: General - Other >> Sep 02, 2018  3:45 PM Yvette Rack wrote: Reason for CRM: Pt called in for Coronavirus results. Pt requests call back.

## 2018-09-02 NOTE — Telephone Encounter (Signed)
COVID test completed on 08/31/2018- takes ~4-5 days for results. Pt informed of above- will call her as soon as we get results. Pt verbalized understanding.

## 2018-09-06 NOTE — Telephone Encounter (Signed)
Patient calling back to check status of results.

## 2018-09-07 LAB — NOVEL CORONAVIRUS, NAA: SARS-CoV-2, NAA: NOT DETECTED

## 2018-09-07 NOTE — Telephone Encounter (Signed)
Pt left VM in [facility] mail box:  calling to get results of COVID testing.

## 2018-09-07 NOTE — Telephone Encounter (Signed)
Still pending

## 2019-07-22 ENCOUNTER — Ambulatory Visit: Payer: 59 | Admitting: Family

## 2019-07-22 LAB — HM MAMMOGRAPHY

## 2019-07-25 ENCOUNTER — Encounter: Payer: Self-pay | Admitting: Family

## 2019-07-25 ENCOUNTER — Encounter (HOSPITAL_BASED_OUTPATIENT_CLINIC_OR_DEPARTMENT_OTHER): Payer: Self-pay | Admitting: *Deleted

## 2019-07-25 ENCOUNTER — Inpatient Hospital Stay (HOSPITAL_BASED_OUTPATIENT_CLINIC_OR_DEPARTMENT_OTHER)
Admission: EM | Admit: 2019-07-25 | Discharge: 2019-07-28 | DRG: 872 | Disposition: A | Discharge status: Home / Self Care | Payer: 59 | Attending: Internal Medicine | Admitting: Internal Medicine

## 2019-07-25 ENCOUNTER — Ambulatory Visit (INDEPENDENT_AMBULATORY_CARE_PROVIDER_SITE_OTHER): Payer: 59 | Admitting: Family

## 2019-07-25 ENCOUNTER — Emergency Department (HOSPITAL_BASED_OUTPATIENT_CLINIC_OR_DEPARTMENT_OTHER): Payer: 59

## 2019-07-25 ENCOUNTER — Other Ambulatory Visit: Payer: Self-pay

## 2019-07-25 VITALS — HR 124 | Temp 102.7°F

## 2019-07-25 DIAGNOSIS — R1032 Left lower quadrant pain: Secondary | ICD-10-CM | POA: Diagnosis not present

## 2019-07-25 DIAGNOSIS — I700 Atherosclerosis of aorta: Secondary | ICD-10-CM | POA: Diagnosis present

## 2019-07-25 DIAGNOSIS — A419 Sepsis, unspecified organism: Secondary | ICD-10-CM

## 2019-07-25 DIAGNOSIS — R590 Localized enlarged lymph nodes: Secondary | ICD-10-CM | POA: Diagnosis present

## 2019-07-25 DIAGNOSIS — K5792 Diverticulitis of intestine, part unspecified, without perforation or abscess without bleeding: Secondary | ICD-10-CM | POA: Diagnosis present

## 2019-07-25 DIAGNOSIS — Z87891 Personal history of nicotine dependence: Secondary | ICD-10-CM | POA: Diagnosis not present

## 2019-07-25 DIAGNOSIS — K5720 Diverticulitis of large intestine with perforation and abscess without bleeding: Secondary | ICD-10-CM | POA: Diagnosis not present

## 2019-07-25 DIAGNOSIS — Z79899 Other long term (current) drug therapy: Secondary | ICD-10-CM

## 2019-07-25 DIAGNOSIS — J45909 Unspecified asthma, uncomplicated: Secondary | ICD-10-CM | POA: Diagnosis not present

## 2019-07-25 DIAGNOSIS — M797 Fibromyalgia: Secondary | ICD-10-CM | POA: Diagnosis present

## 2019-07-25 DIAGNOSIS — H9209 Otalgia, unspecified ear: Secondary | ICD-10-CM

## 2019-07-25 DIAGNOSIS — E785 Hyperlipidemia, unspecified: Secondary | ICD-10-CM | POA: Diagnosis present

## 2019-07-25 DIAGNOSIS — Z823 Family history of stroke: Secondary | ICD-10-CM | POA: Diagnosis not present

## 2019-07-25 DIAGNOSIS — M170 Bilateral primary osteoarthritis of knee: Secondary | ICD-10-CM | POA: Diagnosis present

## 2019-07-25 DIAGNOSIS — Z8349 Family history of other endocrine, nutritional and metabolic diseases: Secondary | ICD-10-CM | POA: Diagnosis not present

## 2019-07-25 DIAGNOSIS — Z833 Family history of diabetes mellitus: Secondary | ICD-10-CM | POA: Diagnosis not present

## 2019-07-25 DIAGNOSIS — J320 Chronic maxillary sinusitis: Secondary | ICD-10-CM | POA: Diagnosis present

## 2019-07-25 DIAGNOSIS — R9431 Abnormal electrocardiogram [ECG] [EKG]: Secondary | ICD-10-CM | POA: Diagnosis not present

## 2019-07-25 DIAGNOSIS — K760 Fatty (change of) liver, not elsewhere classified: Secondary | ICD-10-CM | POA: Diagnosis present

## 2019-07-25 DIAGNOSIS — Z885 Allergy status to narcotic agent status: Secondary | ICD-10-CM

## 2019-07-25 DIAGNOSIS — J322 Chronic ethmoidal sinusitis: Secondary | ICD-10-CM | POA: Diagnosis not present

## 2019-07-25 DIAGNOSIS — Z886 Allergy status to analgesic agent status: Secondary | ICD-10-CM

## 2019-07-25 DIAGNOSIS — Z882 Allergy status to sulfonamides status: Secondary | ICD-10-CM

## 2019-07-25 DIAGNOSIS — Z880 Allergy status to penicillin: Secondary | ICD-10-CM

## 2019-07-25 DIAGNOSIS — Z20822 Contact with and (suspected) exposure to covid-19: Secondary | ICD-10-CM | POA: Diagnosis not present

## 2019-07-25 DIAGNOSIS — H9201 Otalgia, right ear: Secondary | ICD-10-CM | POA: Diagnosis not present

## 2019-07-25 DIAGNOSIS — R109 Unspecified abdominal pain: Secondary | ICD-10-CM | POA: Diagnosis not present

## 2019-07-25 HISTORY — DX: Diverticulitis of intestine, part unspecified, without perforation or abscess without bleeding: K57.92

## 2019-07-25 LAB — CBC WITH DIFFERENTIAL/PLATELET
Abs Immature Granulocytes: 0.06 10*3/uL (ref 0.00–0.07)
Basophils Absolute: 0 10*3/uL (ref 0.0–0.1)
Basophils Relative: 0 %
Eosinophils Absolute: 0.1 10*3/uL (ref 0.0–0.5)
Eosinophils Relative: 0 %
HCT: 46.2 % — ABNORMAL HIGH (ref 36.0–46.0)
Hemoglobin: 15.3 g/dL — ABNORMAL HIGH (ref 12.0–15.0)
Immature Granulocytes: 0 %
Lymphocytes Relative: 16 %
Lymphs Abs: 2.3 10*3/uL (ref 0.7–4.0)
MCH: 30.3 pg (ref 26.0–34.0)
MCHC: 33.1 g/dL (ref 30.0–36.0)
MCV: 91.5 fL (ref 80.0–100.0)
Monocytes Absolute: 1.4 10*3/uL — ABNORMAL HIGH (ref 0.1–1.0)
Monocytes Relative: 9 %
Neutro Abs: 10.9 10*3/uL — ABNORMAL HIGH (ref 1.7–7.7)
Neutrophils Relative %: 75 %
Platelets: 313 10*3/uL (ref 150–400)
RBC: 5.05 MIL/uL (ref 3.87–5.11)
RDW: 13.3 % (ref 11.5–15.5)
WBC: 14.8 10*3/uL — ABNORMAL HIGH (ref 4.0–10.5)
nRBC: 0 % (ref 0.0–0.2)

## 2019-07-25 LAB — URINALYSIS, ROUTINE W REFLEX MICROSCOPIC
Bilirubin Urine: NEGATIVE
Glucose, UA: NEGATIVE mg/dL
Ketones, ur: NEGATIVE mg/dL
Leukocytes,Ua: NEGATIVE
Nitrite: NEGATIVE
Protein, ur: NEGATIVE mg/dL
Specific Gravity, Urine: 1.02 (ref 1.005–1.030)
pH: 6.5 (ref 5.0–8.0)

## 2019-07-25 LAB — COMPREHENSIVE METABOLIC PANEL
ALT: 47 U/L — ABNORMAL HIGH (ref 0–44)
AST: 30 U/L (ref 15–41)
Albumin: 4.1 g/dL (ref 3.5–5.0)
Alkaline Phosphatase: 81 U/L (ref 38–126)
Anion gap: 9 (ref 5–15)
BUN: 9 mg/dL (ref 6–20)
CO2: 24 mmol/L (ref 22–32)
Calcium: 9 mg/dL (ref 8.9–10.3)
Chloride: 103 mmol/L (ref 98–111)
Creatinine, Ser: 0.57 mg/dL (ref 0.44–1.00)
GFR calc Af Amer: >60 mL/min (ref >60)
GFR calc non Af Amer: >60 mL/min (ref >60)
Glucose, Bld: 105 mg/dL — ABNORMAL HIGH (ref 70–99)
Potassium: 3.5 mmol/L (ref 3.5–5.1)
Sodium: 136 mmol/L (ref 135–145)
Total Bilirubin: 1.3 mg/dL — ABNORMAL HIGH (ref 0.3–1.2)
Total Protein: 7.6 g/dL (ref 6.5–8.1)

## 2019-07-25 LAB — SARS CORONAVIRUS 2 (TAT 6-24 HRS): SARS Coronavirus 2: NEGATIVE

## 2019-07-25 LAB — URINALYSIS, MICROSCOPIC (REFLEX)

## 2019-07-25 LAB — LIPASE, BLOOD: Lipase: 27 U/L (ref 11–51)

## 2019-07-25 LAB — LACTIC ACID, PLASMA: Lactic Acid, Venous: 1 mmol/L (ref 0.5–1.9)

## 2019-07-25 MED ORDER — SODIUM CHLORIDE 0.9 % IV SOLN
2.0000 g | Freq: Three times a day (TID) | INTRAVENOUS | Status: DC
  Administered 2019-07-26 – 2019-07-28 (×8): 2 g via INTRAVENOUS
  Filled 2019-07-25 (×10): qty 2

## 2019-07-25 MED ORDER — SODIUM CHLORIDE 0.9 % IV SOLN: INTRAVENOUS | Status: DC

## 2019-07-25 MED ORDER — IOHEXOL 300 MG/ML  SOLN
100.0000 mL | Freq: Once | INTRAMUSCULAR | Status: AC | PRN
  Administered 2019-07-25: 13:00:00 100 mL via INTRAVENOUS

## 2019-07-25 MED ORDER — ACETAMINOPHEN 325 MG PO TABS
650.0000 mg | ORAL_TABLET | Freq: Once | ORAL | Status: AC
  Administered 2019-07-25: 17:00:00 650 mg via ORAL

## 2019-07-25 MED ORDER — ACETAMINOPHEN 325 MG PO TABS
ORAL_TABLET | ORAL | Status: AC
  Filled 2019-07-25: qty 2

## 2019-07-25 MED ORDER — SODIUM CHLORIDE 0.9 % IV BOLUS
1000.0000 mL | Freq: Once | INTRAVENOUS | Status: AC
  Administered 2019-07-25: 14:00:00 1000 mL via INTRAVENOUS

## 2019-07-25 MED ORDER — ACETAMINOPHEN 325 MG PO TABS
650.0000 mg | ORAL_TABLET | Freq: Four times a day (QID) | ORAL | Status: DC | PRN
  Administered 2019-07-26 – 2019-07-28 (×8): 650 mg via ORAL
  Filled 2019-07-25 (×8): qty 2

## 2019-07-25 MED ORDER — METRONIDAZOLE IN NACL 5-0.79 MG/ML-% IV SOLN
500.0000 mg | Freq: Three times a day (TID) | INTRAVENOUS | Status: DC
  Administered 2019-07-26 – 2019-07-28 (×8): 500 mg via INTRAVENOUS
  Filled 2019-07-25 (×8): qty 100

## 2019-07-25 MED ORDER — MORPHINE SULFATE (PF) 2 MG/ML IV SOLN
INTRAVENOUS | Status: AC
  Administered 2019-07-25: 2 mg via INTRAVENOUS
  Filled 2019-07-25: qty 1

## 2019-07-25 MED ORDER — ALBUTEROL SULFATE (2.5 MG/3ML) 0.083% IN NEBU: 2.5000 mg | INHALATION_SOLUTION | Freq: Four times a day (QID) | RESPIRATORY_TRACT | Status: DC | PRN

## 2019-07-25 MED ORDER — MORPHINE SULFATE (PF) 2 MG/ML IV SOLN
1.0000 mg | INTRAVENOUS | Status: DC | PRN
  Administered 2019-07-25 – 2019-07-26 (×2): 1 mg via INTRAVENOUS
  Filled 2019-07-25 (×2): qty 1

## 2019-07-25 MED ORDER — SODIUM CHLORIDE 0.9 % IV SOLN
2.0000 g | Freq: Once | INTRAVENOUS | Status: AC
  Administered 2019-07-25: 14:00:00 2 g via INTRAVENOUS
  Filled 2019-07-25: qty 2

## 2019-07-25 MED ORDER — METRONIDAZOLE IN NACL 5-0.79 MG/ML-% IV SOLN
500.0000 mg | Freq: Once | INTRAVENOUS | Status: AC
  Administered 2019-07-25: 15:00:00 500 mg via INTRAVENOUS
  Filled 2019-07-25: qty 100

## 2019-07-25 MED ORDER — ACETAMINOPHEN 650 MG RE SUPP: 650.0000 mg | Freq: Four times a day (QID) | RECTAL | Status: DC | PRN

## 2019-07-25 MED ORDER — SODIUM CHLORIDE 0.9 % IV SOLN: Freq: Once | INTRAVENOUS | Status: AC

## 2019-07-25 MED ORDER — SODIUM CHLORIDE 0.9 % IV BOLUS
1000.0000 mL | Freq: Once | INTRAVENOUS | Status: AC
  Administered 2019-07-25: 15:00:00 1000 mL via INTRAVENOUS

## 2019-07-25 MED ORDER — ALBUTEROL SULFATE HFA 108 (90 BASE) MCG/ACT IN AERS: 2.0000 | INHALATION_SPRAY | Freq: Four times a day (QID) | RESPIRATORY_TRACT | Status: DC | PRN

## 2019-07-25 MED ORDER — MORPHINE SULFATE (PF) 2 MG/ML IV SOLN: 2.0000 mg | Freq: Once | INTRAVENOUS | Status: AC

## 2019-07-25 MED ORDER — POTASSIUM CHLORIDE 10 MEQ/100ML IV SOLN
10.0000 meq | INTRAVENOUS | Status: AC
  Administered 2019-07-26 (×4): 10 meq via INTRAVENOUS
  Filled 2019-07-25 (×4): qty 100

## 2019-07-25 NOTE — ED Notes (Signed)
Report called  to [place] , RN @ [facility]

## 2019-07-25 NOTE — Progress Notes (Addendum)
Was called by ED PA Janetta Hora to admit patient to [facility] 59 year old female history of diverticulosis presenting with right-sided ear pain fullness of the right side of the head seen at urgent care 4 days prior to admission prescribed prednisone for respiratory symptoms which was never started Covid and strep test done was negative.  Patient presenting back with left lower quadrant abdominal pain, CT abdomen and pelvis done concerning for acute diverticulitis with microperforation and negative for any drainable abscess.  Patient noted on presentation to have a temperature of 102.7, tachycardic with heart rate of 124, blood pressure of 136/94.  Patient given a bolus of normal saline with heart rate improving to 110.  Patient given IV Flagyl and IV cefepime.  Blood cultures pending.  COVID-19 PCR pending.  ED PA states HEENT examination done and unremarkable.  The ED PA patient states some improvement with ear pain. Patient accepted to telemetry at [facility].  Will likely need GI consultation when patient arrives.  No charge.

## 2019-07-25 NOTE — ED Provider Notes (Signed)
[facility] EMERGENCY DEPARTMENT Provider Note   CSN: [id number] Arrival date & time: 07/25/19  0932     History Chief Complaint  Patient presents with  . Abdominal Pain    Christine Rodgers is a 59 y.o. female who presents with abdominal pain.  Patient states that last week she started to have right sided ear pain, fullness on the right side of the head, vertigo symptoms, and left-sided neck pain.  She went to urgent care 4 days ago and was prescribed prednisone for URI symptoms but never started this.  She had a negative Covid and strep test done.  2 days later she started to develop a fever has been taking Tylenol.  She also been developing left lower quadrant abdominal pain.  Initially it felt like "gas pain" but has not intensified and is now sharp and severe in nature.  Pain is constant and worse with palpation of the area.  She states that her URI symptoms are improving.  She denies chest pain or shortness of breath.  She has a chronic cough which is unchanged.  She denies vomiting but has had some nausea and loss of appetite and her joints are achy.  No constipation or diarrhea.  She denies any urinary symptoms.  She is not having periods anymore and denies vaginal discharge or pelvic pain.  She actually had a checkup with OB/GYN last week which was normal. She has had a colonoscopy in 2013 by Dr. Fuller Plan which showed diverticulosis of the descending and sigmoid colon with internal and external hemorrhoids. She had a virtual visit with her doctor today and was advised to come to the ED to be checked out.  HPI     Past Medical History:  Diagnosis Date  . Allergy   . Anemia    occasional low hbg  . Arthritis    knees  . Asthma   . Dysrhythmia   . Eczema   . Family history of anesthesia complication    father   . Fibromyalgia   . History of chicken pox   . Hyperlipidemia   . Pneumonia    3-4 years ago    Patient Active Problem List   Diagnosis Date Noted  . Right  calf pain 10/05/2013  . Sinusitis 05/27/2013  . Palpitations 01/05/2013  . Routine general medical examination at a health care facility 01/12/2012  . Amenorrhea 01/12/2012  . Mole of skin 01/12/2012  . Hearing problem 01/12/2012  . Knee pain, right 10/29/2011  . Hematochezia 10/29/2011  . ARTHRITIS 04/17/2010  . ALLERGY 04/17/2010  . CHICKENPOX, HX OF 04/17/2010  . HYPERLIPIDEMIA 02/28/2010  . ECZEMA 02/27/2010  . CHEST PAIN, ATYPICAL 02/27/2010    Past Surgical History:  Procedure Laterality Date  . BUNIONECTOMY  2008   right foot  . CERVICAL BIOPSY     benign  . KNEE ARTHROSCOPY Left 01/23/2014   Procedure: ARTHROSCOPY KNEE;  Surgeon: Vickey Huger, MD;  Location: [facility];  Service: Orthopedics;  Laterality: Left;     OB History   No obstetric history on file.     Family History  Problem Relation Age of Onset  . Hyperlipidemia Mother   . Hyperlipidemia Father        Deceased 58- sepsis/?pneumonia  . Arrhythmia Father   . Diabetes Paternal Grandmother   . Stroke Paternal Grandmother   . Stroke Paternal Grandfather   . Ulcerative colitis Daughter   . Diabetes Maternal Grandmother   . Diabetes Maternal Grandfather   .  Colon cancer Neg Hx   . Stomach cancer Neg Hx     Social History   Tobacco Use  . Smoking status: Former Smoker    Packs/day: 0.50    Years: 2.00    Pack years: 1.00    Types: Cigarettes    Quit date: 01/18/2004    Years since quitting: 15.5  . Smokeless tobacco: Never Used  Substance Use Topics  . Alcohol use: Yes    Alcohol/week: 7.0 standard drinks    Types: 7 Glasses of wine per week    Comment: occ  . Drug use: No    Home Medications Prior to Admission medications   Medication Sig Start Date End Date Taking? Authorizing Provider  acetaminophen (TYLENOL) 500 MG tablet Take 1,000 mg by mouth daily as needed (for knee pain).    [provider]  albuterol (PROVENTIL HFA;VENTOLIN HFA) 108 (90 BASE) MCG/ACT inhaler Inhale 2 puffs  into the lungs every 6 (six) hours as needed for wheezing. 10/29/11   Debbrah Alar, NP  albuterol (PROVENTIL HFA;VENTOLIN HFA) 108 (90 Base) MCG/ACT inhaler Inhale 2 puffs into the lungs every 6 (six) hours as needed for wheezing or shortness of breath. 08/31/18   Saguier, Percell Miller, PA-C  benzonatate (TESSALON) 100 MG capsule Take 1 capsule (100 mg total) by mouth 3 (three) times daily as needed for cough. 08/31/18   Saguier, Percell Miller, PA-C  cefdinir (OMNICEF) 300 MG capsule Take 1 capsule (300 mg total) by mouth 2 (two) times daily. 08/17/17   Debbrah Alar, NP  ergocalciferol (DRISDOL) 8000 UNIT/ML drops Take 8,000 Units by mouth daily.    [provider]  fluticasone (FLONASE) 50 MCG/ACT nasal spray Place 2 sprays into both nostrils daily. 08/31/18   Saguier, Percell Miller, PA-C  hydrocortisone cream 1 % Apply 1 application topically daily.    [provider]  levonorgestrel (MIRENA) 20 MCG/24HR IUD 1 each by Intrauterine route once.      [provider]  Lysine 1000 MG TABS Take by mouth.    [provider]  meclizine (ANTIVERT) 25 MG tablet Take 1 tablet (25 mg total) by mouth 3 (three) times daily as needed for dizziness. 08/17/17   Debbrah Alar, NP  mometasone (NASONEX) 50 MCG/ACT nasal spray Place 2 sprays into the nose daily. 08/06/17   Debbrah Alar, NP  Multiple Vitamin (MULTIVITAMIN) tablet Take 1 tablet by mouth daily.    [provider]  Omega-3 Fatty Acids (FISH OIL PO) Take by mouth daily as needed.     [provider]  predniSONE (DELTASONE) 1 MG tablet 4 tabs by mouth once daily for 5 days. 08/17/17   Debbrah Alar, NP    Allergies    Aspirin, Codeine, Doxycycline hyclate, Erythromycin, Penicillins, Sulfonamide derivatives, and Levaquin [levofloxacin in d5w]  Review of Systems   Review of Systems  Constitutional: Positive for appetite change, fatigue and fever. Negative for chills.  HENT: Positive for ear pain.  Negative for sore throat.   Respiratory: Positive for cough (chronic). Negative for shortness of breath.   Cardiovascular: Negative for chest pain.  Gastrointestinal: Positive for abdominal pain and nausea. Negative for constipation, diarrhea and vomiting.  Genitourinary: Negative for dysuria, flank pain and hematuria.  Musculoskeletal: Positive for arthralgias. Negative for back pain.  Neurological: Positive for dizziness.  All other systems reviewed and are negative.   Physical Exam Updated Vital Signs BP (!) 136/94 (BP Location: Right Arm)   Pulse (!) 110   Temp 99.7 F (37.6 C) (Oral)  Resp 18   Ht 5\' 7"  (1.702 m)   Wt 111.1 kg   LMP 05/22/2013 Comment: perimenopausal  SpO2 98%   BMI 38.37 kg/m   Physical Exam Vitals and nursing note reviewed.  Constitutional:      General: She is not in acute distress.    Appearance: She is well-developed. She is obese. She is not ill-appearing.     Comments: Calm, cooperative NAD  HENT:     Head: Normocephalic and atraumatic.     Right Ear: Tympanic membrane normal.     Left Ear: Tympanic membrane normal.     Nose: Nose normal.     Mouth/Throat:     Mouth: Mucous membranes are moist.  Eyes:     General: No scleral icterus.       Right eye: No discharge.        Left eye: No discharge.     Conjunctiva/sclera: Conjunctivae normal.     Pupils: Pupils are equal, round, and reactive to light.  Cardiovascular:     Rate and Rhythm: Normal rate and regular rhythm.  Pulmonary:     Effort: Pulmonary effort is normal. No respiratory distress.     Breath sounds: Normal breath sounds.  Abdominal:     General: There is no distension.     Palpations: Abdomen is soft.     Tenderness: There is abdominal tenderness (LLQ).  Musculoskeletal:     Cervical back: Normal range of motion.  Lymphadenopathy:     Cervical: Cervical adenopathy (over left side) present.  Skin:    General: Skin is warm and dry.  Neurological:     Mental Status: She  is alert and oriented to person, place, and time.  Psychiatric:        Mood and Affect: Mood normal.        Behavior: Behavior normal.     ED Results / Procedures / Treatments   Labs (all labs ordered are listed, but only abnormal results are displayed) Labs Reviewed  CBC WITH DIFFERENTIAL/PLATELET - Abnormal; Notable for the following components:      Result Value   WBC 14.8 (*)    Hemoglobin 15.3 (*)    HCT 46.2 (*)    Neutro Abs 10.9 (*)    Monocytes Absolute 1.4 (*)    All other components within normal limits  COMPREHENSIVE METABOLIC PANEL - Abnormal; Notable for the following components:   Glucose, Bld 105 (*)    ALT 47 (*)    Total Bilirubin 1.3 (*)    All other components within normal limits  URINALYSIS, ROUTINE W REFLEX MICROSCOPIC - Abnormal; Notable for the following components:   Hgb urine dipstick SMALL (*)    All other components within normal limits  URINALYSIS, MICROSCOPIC (REFLEX) - Abnormal; Notable for the following components:   Bacteria, UA RARE (*)    All other components within normal limits  CULTURE, BLOOD (ROUTINE X 2)  CULTURE, BLOOD (ROUTINE X 2)  SARS CORONAVIRUS 2 (TAT 6-24 HRS)  LIPASE, BLOOD  LACTIC ACID, PLASMA    EKG EKG Interpretation  Date/Time:  Monday July 25 2019 14:57:41 EST Ventricular Rate:  104 PR Interval:    QRS Duration: 90 QT Interval:  383 QTC Calculation: 504 R Axis:   44 Text Interpretation: Sinus tachycardia Borderline T wave abnormalities Borderline prolonged QT interval Confirmed by Fredia Sorrow [phone]) on 07/25/2019 3:42:45 PM   Radiology CT Abdomen Pelvis W Contrast  Result Date: 07/25/2019 CLINICAL DATA:  Left lower quadrant  pain with nausea and fever EXAM: CT ABDOMEN AND PELVIS WITH CONTRAST TECHNIQUE: Multidetector CT imaging of the abdomen and pelvis was performed using the standard protocol following bolus administration of intravenous contrast. CONTRAST:  163mL OMNIPAQUE IOHEXOL 300 MG/ML  SOLN  COMPARISON:  None. FINDINGS: Lower chest: Scattered bibasilar subsegmental atelectasis and or scarring. Normal heart size. No pericardial or pleural effusion. Small hiatal hernia noted. Degenerative changes thoracic spine with osteophytes on the right. Hepatobiliary: Diffuse hypoattenuation liver compatible with hepatic steatosis. Mild associated hepatomegaly measuring 20 cm in length. No focal hepatic abnormality or intrahepatic biliary dilatation. Hepatic and portal veins are patent. Gallbladder and biliary system unremarkable. Common bile duct nondilated. Pancreas: Unremarkable. No pancreatic ductal dilatation or surrounding inflammatory changes. Spleen: Normal in size without focal abnormality. Accessory splenule noted. Adrenals/Urinary Tract: Adrenal glands are unremarkable. Kidneys are normal, without renal calculi, focal lesion, or hydronephrosis. Bladder is unremarkable. Stomach/Bowel: Negative for bowel obstruction, significant dilatation, ileus, or free air. Normal appendix in the right lower quadrant. In the left lower quadrant, there is diverticular disease noted with scattered diverticula and left descending colon pericolonic strandy edema/inflammation along the left retroperitoneal space. Adjacent locules of extraluminal air noted in the left pericolonic fat, images 62. Findings compatible with localized acute diverticulitis and adjacent micro perforation. No associated significant drainable fluid collection or abscess. Vascular/Lymphatic: Aortoiliac minor atherosclerosis without occlusive process, aneurysm or acute vascular process. Mesenteric and renal vasculature appear patent. No veno-occlusive process. No bulky adenopathy. Reproductive: Uterus and adnexa normal in size. Nonspecific mild prominence of the ovarian veins bilaterally remains nonspecific. No pelvic free fluid or fluid collection. Other: No abdominal wall hernia or abnormality. No abdominopelvic ascites. Musculoskeletal: Degenerative  changes of the spine with lower lumbar facet arthropathy. IMPRESSION: Acute left lower quadrant descending colon diverticulitis with adjacent small micro perforation and moderate inflammation/edema in the left pericolic gutter and retroperitoneal space. No drainable fluid collection or abscess. Hepatic steatosis Aortic Atherosclerosis (ICD10-I70.0). Electronically Signed   By: Jerilynn Mages.  Shick M.D.   On: 07/25/2019 13:17    Procedures Procedures (including critical care time)\  CRITICAL CARE Performed by: Recardo Evangelist   Total critical care time: 35 minutes  Critical care time was exclusive of separately billable procedures and treating other patients.  Critical care was necessary to treat or prevent imminent or life-threatening deterioration.  Critical care was time spent personally by me on the following activities: development of treatment plan with patient and/or surrogate as well as nursing, discussions with consultants, evaluation of patient's response to treatment, examination of patient, obtaining history from patient or surrogate, ordering and performing treatments and interventions, ordering and review of laboratory studies, ordering and review of radiographic studies, pulse oximetry and re-evaluation of patient's condition.   Medications Ordered in ED Medications  iohexol (OMNIPAQUE) 300 MG/ML solution 100 mL (100 mLs Intravenous Contrast Given 07/25/19 1242)  sodium chloride 0.9 % bolus 1,000 mL (0 mLs Intravenous Stopped 07/25/19 1439)  ceFEPIme (MAXIPIME) 2 g in sodium chloride 0.9 % 100 mL IVPB (0 g Intravenous Stopped 07/25/19 1422)    And  metroNIDAZOLE (FLAGYL) IVPB 500 mg (0 mg Intravenous Stopped 07/25/19 1539)  morphine 2 MG/ML injection 2 mg (2 mg Intravenous Given 07/25/19 1359)  sodium chloride 0.9 % bolus 1,000 mL (0 mLs Intravenous Stopped 07/25/19 1542)  0.9 %  sodium chloride infusion ( Intravenous New Bag/Given 07/25/19 1541)    ED Course  I have reviewed the triage vital  signs and the nursing notes.  Pertinent labs &  imaging results that were available during my care of the patient were reviewed by me and considered in my medical decision making (see chart for details).  59 year old female presents with acute abdominal pain in the LLQ. This was preceded by ear pain, vertigo, and lymphadenopathy of the neck. She is febrile to 102.7 here and is tachycardic. On exam her HENT exam is unremarkable other than tenderness over the left anterior cervical lymph node. Heart rate is fast and regular. Lungs are CTA. Abdomen is significantly tender in the LLQ. Will obtain labs, UA, CT abdomen/pelvis.  CBC shows leukocytosis of 14.8.  CMP and lipase are normal.  Lactate is normal.  Blood cultures added on.  Urine has small amount of hemoglobin but otherwise clear.  CT shows diverticulitis of the descending colon with microperforation.  Shared visit with Dr. Rex Kras.  Will add cefepime and Flagyl due to penicillin allergy  Discussed with Dr. Grandville Silos with [facility] who will admit for acute diverticulitis with microperforation.  He is requesting to add on an EKG.  EKG is sinus tachycardia with prolonged QT nonspecific T wave abnormalities.  MDM Rules/Calculators/A&P                     Final Clinical Impression(s) / ED Diagnoses Final diagnoses:  Diverticulitis of large intestine with perforation without bleeding    Rx / DC Orders ED Discharge Orders    None       Recardo Evangelist, PA-C 07/25/19 1548    Little, Wenda Overland, MD 07/26/19 [phone]

## 2019-07-25 NOTE — Progress Notes (Signed)
Virtual Visit via Video Note  I connected with Christine Rodgers on 07/25/19 at  8:40 AM EST by a video enabled telemedicine application and verified that I am speaking with the correct person using two identifiers.  Location: Patient: home Provider: home   I discussed the limitations of evaluation and management by telemedicine and the availability of in person appointments. The patient expressed understanding and agreed to proceed.  History of Present Illness:  Pt is a 59 yr old female who presents today with several symptoms. She reports that she had a pain in her neck (left)- went to urgent care on Thursday night. Had a negative rapid covid test and neg strep test. Was prescribed prednisone "for my eustachian tubes."  She did not start the prednisone because she started to develop other symptoms.  She started with a fever on Friday (100-101). Then on Saturday she developed abdominal pain. Reports that when she gets out of bed the pain is "unbearable."  She reports that pain is lower side.  Reports that her stomach has gotten, "more firm on the left hand side."  Reports normal bowel movements. Reports that her temp hit 102.7 last night. Tylenol will bring it down to 101-101.5.  Still having some pain left upper neck pain. Reports + cough x 1 year.   Past Medical History:  Diagnosis Date  . Allergy   . Anemia    occasional low hbg  . Arthritis    knees  . Asthma   . Dysrhythmia   . Eczema   . Family history of anesthesia complication    father   . Fibromyalgia   . History of chicken pox   . Hyperlipidemia   . Pneumonia    3-4 years ago     Social History   Socioeconomic History  . Marital status: Married    Spouse name: Not on file  . Number of children: 3  . Years of education: Not on file  . Highest education level: Not on file  Occupational History  . Occupation: EXECUTIVE ADMIN [employer]    Employer: UNEMPLOYED  Tobacco Use  . Smoking status: Former Smoker    Packs/day:  0.50    Years: 2.00    Pack years: 1.00    Types: Cigarettes    Quit date: 01/18/2004    Years since quitting: 15.5  . Smokeless tobacco: Never Used  Substance and Sexual Activity  . Alcohol use: Yes    Alcohol/week: 7.0 standard drinks    Types: 7 Glasses of wine per week    Comment: occ  . Drug use: No  . Sexual activity: Not on file  Other Topics Concern  . Not on file  Social History Narrative   Regular exercise:  No   Caffeine use:  2 mugs of coffee daily               Social Determinants of Health   Financial Resource Strain:   . Difficulty of Paying Living Expenses: Not on file  Food Insecurity:   . Worried About Charity fundraiser in the Last Year: Not on file  . Ran Out of Food in the Last Year: Not on file  Transportation Needs:   . Lack of Transportation (Medical): Not on file  . Lack of Transportation (Non-Medical): Not on file  Physical Activity:   . Days of Exercise per Week: Not on file  . Minutes of Exercise per Session: Not on file  Stress:   . Feeling  of Stress : Not on file  Social Connections:   . Frequency of Communication with Friends and Family: Not on file  . Frequency of Social Gatherings with Friends and Family: Not on file  . Attends Religious Services: Not on file  . Active Member of Clubs or Organizations: Not on file  . Attends Archivist Meetings: Not on file  . Marital Status: Not on file  Intimate Partner Violence:   . Fear of Current or Ex-Partner: Not on file  . Emotionally Abused: Not on file  . Physically Abused: Not on file  . Sexually Abused: Not on file    Past Surgical History:  Procedure Laterality Date  . BUNIONECTOMY  2008   right foot  . CERVICAL BIOPSY     benign  . KNEE ARTHROSCOPY Left 01/23/2014   Procedure: ARTHROSCOPY KNEE;  Surgeon: Vickey Huger, MD;  Location: [facility];  Service: Orthopedics;  Laterality: Left;    Family History  Problem Relation Age of Onset  . Hyperlipidemia Mother   .  Hyperlipidemia Father        Deceased 74- sepsis/?pneumonia  . Arrhythmia Father   . Diabetes Paternal Grandmother   . Stroke Paternal Grandmother   . Stroke Paternal Grandfather   . Ulcerative colitis Daughter   . Diabetes Maternal Grandmother   . Diabetes Maternal Grandfather   . Colon cancer Neg Hx   . Stomach cancer Neg Hx     Allergies  Allergen Reactions  . Aspirin     REACTION: severe nausea  . Codeine     REACTION: severe nausea  . Doxycycline Hyclate     REACTION: hives, throat swelling  . Erythromycin     Rash, gi side effects.    . Penicillins     REACTION: passed out  . Sulfonamide Derivatives     REACTION: diarrhea, vomitting  . Levaquin [Levofloxacin In D5w] Rash    Current Outpatient Medications on File Prior to Visit  Medication Sig Dispense Refill  . albuterol (PROVENTIL HFA;VENTOLIN HFA) 108 (90 Base) MCG/ACT inhaler Inhale 2 puffs into the lungs every 6 (six) hours as needed for wheezing or shortness of breath. 1 Inhaler 2  . acetaminophen (TYLENOL) 500 MG tablet Take 1,000 mg by mouth daily as needed (for knee pain).    Marland Kitchen albuterol (PROVENTIL HFA;VENTOLIN HFA) 108 (90 BASE) MCG/ACT inhaler Inhale 2 puffs into the lungs every 6 (six) hours as needed for wheezing. 1 Inhaler 0  . benzonatate (TESSALON) 100 MG capsule Take 1 capsule (100 mg total) by mouth 3 (three) times daily as needed for cough. 30 capsule 0  . cefdinir (OMNICEF) 300 MG capsule Take 1 capsule (300 mg total) by mouth 2 (two) times daily. 20 capsule 0  . ergocalciferol (DRISDOL) 8000 UNIT/ML drops Take 8,000 Units by mouth daily.    . fluticasone (FLONASE) 50 MCG/ACT nasal spray Place 2 sprays into both nostrils daily. 16 g 1  . hydrocortisone cream 1 % Apply 1 application topically daily.    Marland Kitchen levonorgestrel (MIRENA) 20 MCG/24HR IUD 1 each by Intrauterine route once.      Marland Kitchen Lysine 1000 MG TABS Take by mouth.    . meclizine (ANTIVERT) 25 MG tablet Take 1 tablet (25 mg total) by mouth 3  (three) times daily as needed for dizziness. 30 tablet 0  . mometasone (NASONEX) 50 MCG/ACT nasal spray Place 2 sprays into the nose daily. 17 g 0  . Multiple Vitamin (MULTIVITAMIN) tablet Take 1 tablet by  mouth daily.    . Omega-3 Fatty Acids (FISH OIL PO) Take by mouth daily as needed.     . predniSONE (DELTASONE) 1 MG tablet 4 tabs by mouth once daily for 5 days. 20 tablet 0   No current facility-administered medications on file prior to visit.    Pulse (!) 124   Temp (!) 102.7 F (39.3 C) (Tympanic)   LMP 05/22/2013 Comment: perimenopausal  SpO2 98%      Observations/Objective:   Gen: Awake, alert, no acute distress Resp: Breathing is even and non-labored Psych: calm/pleasant demeanor Neuro: Alert and Oriented x 3, + facial symmetry, speech is clear.   Assessment and Plan:  Acute abdominal pain-new.  concerning in the setting of fever, tachycardia.  She is also having left sided neck pain. I have advised the pt to proceed to the ER for further evaluation and imaging. Pt verbalizes understanding and is agreeable to proceed to the ED now.   Left sided neck pain- etiology unclear- needs an in person exam.    Follow Up Instructions:    I discussed the assessment and treatment plan with the patient. The patient was provided an opportunity to ask questions and all were answered. The patient agreed with the plan and demonstrated an understanding of the instructions.   The patient was advised to call back or seek an in-person evaluation if the symptoms worsen or if the condition fails to improve as anticipated.  Nance Pear, NP

## 2019-07-25 NOTE — ED Triage Notes (Addendum)
C/o fever up to 102.7   Lower left abd pain onset Saturday,  Nausea,  Lymph node swollen on left side of neck ,  Rt side of face and ear feels swollen and painful`  Has been taking tylenol for fever    Generalized body aches  Had neg rapid covid test thrusday

## 2019-07-25 NOTE — H&P (Signed)
History and Physical    Christine Rodgers [id number] DOB: 07-23-1960 DOA: 07/25/2019  PCP: Debbrah Alar, NP Patient coming from: [facility]  Chief Complaint: Fever, abdominal pain  HPI: Christine Rodgers is a 59 y.o. female with medical history significant of anemia, arthritis, asthma, fibromyalgia, hyperlipidemia, and conditions listed below presenting to the ED for evaluation of fever and abdominal pain.  Patient states for the past 2 days she has had sharp left lower quadrant abdominal pain.  No associated emesis or diarrhea.  She is having regular nonbloody bowel movements.  Yesterday she had a fever of 102.7 F.  States about 4 days ago she was seen at urgent care as she was having swelling of a lymph node in her neck.  In addition, she was also having right-sided sinus pain/pressure and right ear pressure.  Also having rhinorrhea with clear nasal secretions.  No sore throat.  Her URI symptoms started about a week ago.  States was prescribed prednisone which she has not started yet.  ED Course: HEENT exam done in the ED unremarkable other than tenderness over the left anterior cervical lymph node.  Febrile with T-max 102 F.  Slightly tachycardic.  Not hypotensive.  Not hypoxic.  Labs showing WBC count 14.8.  Lactic acid normal.  No significant elevation of LFTs.  Lipase normal.  UA not suggestive of infection.  Blood culture x2 pending.  SARS-CoV-2 PCR test negative.  CT abdomen pelvis showing acute left lower quadrant descending colon diverticulitis with adjacent small microperforation and moderate inflammation/edema in the left pericolic gutter and retroperitoneal space.  No drainable fluid collection or abscess.  Patient received Tylenol, morphine, cefepime, metronidazole, and 2 L normal saline boluses.  Review of Systems:  All systems reviewed and apart from history of presenting illness, are negative.  Past Medical History:  Diagnosis Date  . Allergy   . Anemia    occasional low hbg    . Arthritis    knees  . Asthma   . Dysrhythmia   . Eczema   . Family history of anesthesia complication    father   . Fibromyalgia   . History of chicken pox   . Hyperlipidemia   . Pneumonia    3-4 years ago    Past Surgical History:  Procedure Laterality Date  . BUNIONECTOMY  2008   right foot  . CERVICAL BIOPSY     benign  . KNEE ARTHROSCOPY Left 01/23/2014   Procedure: ARTHROSCOPY KNEE;  Surgeon: Vickey Huger, MD;  Location: [facility];  Service: Orthopedics;  Laterality: Left;     reports that she quit smoking about 15 years ago. Her smoking use included cigarettes. She has a 1.00 pack-year smoking history. She has never used smokeless tobacco. She reports current alcohol use of about 7.0 standard drinks of alcohol per week. She reports that she does not use drugs.  Allergies  Allergen Reactions  . Aspirin     REACTION: severe nausea  . Codeine     REACTION: severe nausea  . Doxycycline Hyclate     REACTION: hives, throat swelling  . Erythromycin     Rash, gi side effects.    . Penicillins     REACTION: passed out  . Sulfonamide Derivatives     REACTION: diarrhea, vomitting  . Levaquin [Levofloxacin In D5w] Rash    Family History  Problem Relation Age of Onset  . Hyperlipidemia Mother   . Hyperlipidemia Father        Deceased 46- sepsis/?pneumonia  .  Arrhythmia Father   . Diabetes Paternal Grandmother   . Stroke Paternal Grandmother   . Stroke Paternal Grandfather   . Ulcerative colitis Daughter   . Diabetes Maternal Grandmother   . Diabetes Maternal Grandfather   . Colon cancer Neg Hx   . Stomach cancer Neg Hx     Prior to Admission medications   Medication Sig Start Date End Date Taking? Authorizing Provider  albuterol (PROVENTIL HFA;VENTOLIN HFA) 108 (90 Base) MCG/ACT inhaler Inhale 2 puffs into the lungs every 6 (six) hours as needed for wheezing or shortness of breath. 08/31/18  Yes Saguier, Percell Miller, PA-C  cholecalciferol (VITAMIN D3) 25 MCG (1000 UNIT)  tablet Take 1,000 Units by mouth daily.   Yes [provider]  co-enzyme Q-10 30 MG capsule Take 30 mg by mouth daily.   Yes [provider]  Elderberry 575 MG/5ML SYRP Take 5 mLs by mouth daily.   Yes [provider]  Nutritional Supplements (GRAPESEED EXTRACT PO) Take 10 drops by mouth daily.   Yes [provider]  psyllium (REGULOID) 0.52 g capsule Take 0.104 g by mouth daily.    Yes [provider]  Turmeric 500 MG TABS Take 250 tablets by mouth daily.   Yes [provider]  albuterol (PROVENTIL HFA;VENTOLIN HFA) 108 (90 BASE) MCG/ACT inhaler Inhale 2 puffs into the lungs every 6 (six) hours as needed for wheezing. Patient not taking: Reported on 07/25/2019 10/29/11   Debbrah Alar, NP  benzonatate (TESSALON) 100 MG capsule Take 1 capsule (100 mg total) by mouth 3 (three) times daily as needed for cough. Patient not taking: Reported on 07/25/2019 08/31/18   Saguier, Percell Miller, PA-C  cefdinir (OMNICEF) 300 MG capsule Take 1 capsule (300 mg total) by mouth 2 (two) times daily. Patient not taking: Reported on 07/25/2019 08/17/17   Debbrah Alar, NP  fluticasone [facility]) 50 MCG/ACT nasal spray Place 2 sprays into both nostrils daily. Patient not taking: Reported on 07/25/2019 08/31/18   Saguier, Percell Miller, PA-C  meclizine (ANTIVERT) 25 MG tablet Take 1 tablet (25 mg total) by mouth 3 (three) times daily as needed for dizziness. Patient not taking: Reported on 07/25/2019 08/17/17   Debbrah Alar, NP  mometasone (NASONEX) 50 MCG/ACT nasal spray Place 2 sprays into the nose daily. Patient not taking: Reported on 07/25/2019 08/06/17   Debbrah Alar, NP  predniSONE (DELTASONE) 1 MG tablet 4 tabs by mouth once daily for 5 days. Patient not taking: Reported on 07/25/2019 08/17/17   Debbrah Alar, NP    Physical Exam: Vitals:   07/25/19 1724 07/25/19 1846 07/25/19 2037 07/25/19 2334  BP: 132/89 (!) 154/93 129/83 (!) 143/90  Pulse: (!) 107 (!)  112 97 (!) 102  Resp: 19 (!) 22 18 18   Temp:  98.4 F (36.9 C) 98.5 F (36.9 C) 99.3 F (37.4 C)  TempSrc:  Oral    SpO2: 100% 97% 97% 95%  Weight: 111.1 kg     Height: 5\' 7"  (1.702 m)       Physical Exam  Constitutional: She is oriented to person, place, and time. She appears well-developed and well-nourished. No distress.  HENT:  Head: Normocephalic.  No sinus pressure  Eyes: Right eye exhibits no discharge. Left eye exhibits no discharge.  Cardiovascular: Normal rate, regular rhythm and intact distal pulses.  Pulmonary/Chest: Effort normal and breath sounds normal. No respiratory distress. She has no wheezes. She has no rales.  Abdominal: Soft. Bowel sounds are normal. She exhibits no distension. There is abdominal tenderness.  There is no rebound and no guarding.  Left lower quadrant tender to palpation  Musculoskeletal:        General: No edema.     Cervical back: Neck supple.  Lymphadenopathy:    She has cervical adenopathy.  Neurological: She is alert and oriented to person, place, and time.  Skin: Skin is warm and dry. She is not diaphoretic.     Labs on Admission: I have personally reviewed following labs and imaging studies  CBC: Recent Labs  Lab 07/25/19 1130  WBC 14.8*  NEUTROABS 10.9*  HGB 15.3*  HCT 46.2*  MCV 91.5  PLT [id number]   Basic Metabolic Panel: Recent Labs  Lab 07/25/19 1130  NA 136  K 3.5  CL 103  CO2 24  GLUCOSE 105*  BUN 9  CREATININE 0.57  CALCIUM 9.0   GFR: Estimated Creatinine Clearance: 98.5 mL/min (by C-G formula based on SCr of 0.57 mg/dL). Liver Function Tests: Recent Labs  Lab 07/25/19 1130  AST 30  ALT 47*  ALKPHOS 81  BILITOT 1.3*  PROT 7.6  ALBUMIN 4.1   Recent Labs  Lab 07/25/19 1130  LIPASE 27   No results for input(s): AMMONIA in the last 168 hours. Coagulation Profile: No results for input(s): INR, PROTIME in the last 168 hours. Cardiac Enzymes: No results for input(s): CKTOTAL, CKMB, CKMBINDEX,  TROPONINI in the last 168 hours. BNP (last 3 results) No results for input(s): PROBNP in the last 8760 hours. HbA1C: No results for input(s): HGBA1C in the last 72 hours. CBG: No results for input(s): GLUCAP in the last 168 hours. Lipid Profile: No results for input(s): CHOL, HDL, LDLCALC, TRIG, CHOLHDL, LDLDIRECT in the last 72 hours. Thyroid Function Tests: No results for input(s): TSH, T4TOTAL, FREET4, T3FREE, THYROIDAB in the last 72 hours. Anemia Panel: No results for input(s): VITAMINB12, FOLATE, FERRITIN, TIBC, IRON, RETICCTPCT in the last 72 hours. Urine analysis:    Component Value Date/Time   COLORURINE YELLOW 07/25/2019 1130   APPEARANCEUR CLEAR 07/25/2019 1130   LABSPEC 1.020 07/25/2019 1130   PHURINE 6.5 07/25/2019 1130   GLUCOSEU NEGATIVE 07/25/2019 1130   HGBUR SMALL (A) 07/25/2019 1130   BILIRUBINUR NEGATIVE 07/25/2019 1130   BILIRUBINUR neg 08/02/2015 0951   KETONESUR NEGATIVE 07/25/2019 1130   PROTEINUR NEGATIVE 07/25/2019 1130   UROBILINOGEN 1.0 08/02/2015 0951   UROBILINOGEN 0.2 01/12/2012 1016   NITRITE NEGATIVE 07/25/2019 1130   LEUKOCYTESUR NEGATIVE 07/25/2019 1130    Radiological Exams on Admission: CT Abdomen Pelvis W Contrast  Result Date: 07/25/2019 CLINICAL DATA:  Left lower quadrant pain with nausea and fever EXAM: CT ABDOMEN AND PELVIS WITH CONTRAST TECHNIQUE: Multidetector CT imaging of the abdomen and pelvis was performed using the standard protocol following bolus administration of intravenous contrast. CONTRAST:  153mL OMNIPAQUE IOHEXOL 300 MG/ML  SOLN COMPARISON:  None. FINDINGS: Lower chest: Scattered bibasilar subsegmental atelectasis and or scarring. Normal heart size. No pericardial or pleural effusion. Small hiatal hernia noted. Degenerative changes thoracic spine with osteophytes on the right. Hepatobiliary: Diffuse hypoattenuation liver compatible with hepatic steatosis. Mild associated hepatomegaly measuring 20 cm in length. No focal  hepatic abnormality or intrahepatic biliary dilatation. Hepatic and portal veins are patent. Gallbladder and biliary system unremarkable. Common bile duct nondilated. Pancreas: Unremarkable. No pancreatic ductal dilatation or surrounding inflammatory changes. Spleen: Normal in size without focal abnormality. Accessory splenule noted. Adrenals/Urinary Tract: Adrenal glands are unremarkable. Kidneys are normal, without renal calculi, focal lesion, or hydronephrosis. Bladder is unremarkable. Stomach/Bowel: Negative for bowel  obstruction, significant dilatation, ileus, or free air. Normal appendix in the right lower quadrant. In the left lower quadrant, there is diverticular disease noted with scattered diverticula and left descending colon pericolonic strandy edema/inflammation along the left retroperitoneal space. Adjacent locules of extraluminal air noted in the left pericolonic fat, images 62. Findings compatible with localized acute diverticulitis and adjacent micro perforation. No associated significant drainable fluid collection or abscess. Vascular/Lymphatic: Aortoiliac minor atherosclerosis without occlusive process, aneurysm or acute vascular process. Mesenteric and renal vasculature appear patent. No veno-occlusive process. No bulky adenopathy. Reproductive: Uterus and adnexa normal in size. Nonspecific mild prominence of the ovarian veins bilaterally remains nonspecific. No pelvic free fluid or fluid collection. Other: No abdominal wall hernia or abnormality. No abdominopelvic ascites. Musculoskeletal: Degenerative changes of the spine with lower lumbar facet arthropathy. IMPRESSION: Acute left lower quadrant descending colon diverticulitis with adjacent small micro perforation and moderate inflammation/edema in the left pericolic gutter and retroperitoneal space. No drainable fluid collection or abscess. Hepatic steatosis Aortic Atherosclerosis (ICD10-I70.0). Electronically Signed   By: Jerilynn Mages.  Shick M.D.   On:  07/25/2019 13:17    EKG: Independently reviewed.  Sinus tachycardia, heart rate 104.  QTc 504.  Artifact in leads I, III, and aVL.  Assessment/Plan Principal Problem:   Acute diverticulitis Active Problems:   Sepsis ([place])   Ear pain   Cervical lymphadenopathy   QT prolongation   Sepsis secondary to acute diverticulitis with small microperforation Patient is presenting with a 2-day history of fevers and left lower quadrant abdominal pain.  Febrile in the ED with T-max 102 F.  Slightly tachycardic on arrival, now resolved after IV fluid boluses.  Not hypotensive.  WBC count 14.8.  Lactic acid normal. CT abdomen pelvis showing acute left lower quadrant descending colon diverticulitis with adjacent small microperforation and moderate inflammation/edema in the left pericolic gutter and retroperitoneal space.  No drainable fluid collection or abscess. -IV fluid hydration -Continue cefepime and metronidazole -Morphine as needed for pain -Tylenol as needed for fevers -Consult GI in a.m. -Blood culture x2 pending  Right-sided ear/sinus pressure, cervical lymphadenopathy Tympanic membranes appear normal upon ear examination done in the ED.  Does have cervical lymphadenopathy on exam.  No sinus pain appreciated on palpation of sinuses. -CT maxillofacial and neck for further evaluation -Antibiotics as above  Asthma -Stable.  No bronchospasm.  Continue home albuterol inhaler as needed.  QT prolongation on EKG -Cardiac monitoring -Keep potassium above 4 and magnesium above 2 -Avoid QT prolonging drugs if possible -Repeat EKG in a.m.  DVT prophylaxis: SCDs at this time Code Status: Full code Family Communication: No family available at this time. Disposition Plan: Anticipate discharge after clinical improvement. Consults called: None Admission status: .It is my clinical opinion that admission to INPATIENT is reasonable and necessary in this 59 y.o. female . presenting with sepsis  secondary to acute diverticulitis with microperforation.  High risk of decompensation.  Given the aforementioned, the predictability of an adverse outcome is felt to be significant. I expect that the patient will require at least 2 midnights in the hospital to treat this condition.   The medical decision making on this patient was of high complexity and the patient is at high risk for clinical deterioration, therefore this is a level 3 visit.  [name] MD [facility]  If 7PM-7AM, please contact night-coverage [email or web address] Password [email or web address]  07/25/2019, 11:40 PM

## 2019-07-26 ENCOUNTER — Other Ambulatory Visit: Payer: Self-pay

## 2019-07-26 ENCOUNTER — Inpatient Hospital Stay (HOSPITAL_COMMUNITY): Payer: 59

## 2019-07-26 ENCOUNTER — Encounter (HOSPITAL_COMMUNITY): Payer: Self-pay | Admitting: Internal Medicine

## 2019-07-26 DIAGNOSIS — R9431 Abnormal electrocardiogram [ECG] [EKG]: Secondary | ICD-10-CM

## 2019-07-26 DIAGNOSIS — R1032 Left lower quadrant pain: Secondary | ICD-10-CM

## 2019-07-26 DIAGNOSIS — A419 Sepsis, unspecified organism: Principal | ICD-10-CM

## 2019-07-26 DIAGNOSIS — R590 Localized enlarged lymph nodes: Secondary | ICD-10-CM

## 2019-07-26 DIAGNOSIS — H9201 Otalgia, right ear: Secondary | ICD-10-CM

## 2019-07-26 LAB — CBC
HCT: 42.6 % (ref 36.0–46.0)
Hemoglobin: 13.7 g/dL (ref 12.0–15.0)
MCH: 30 pg (ref 26.0–34.0)
MCHC: 32.2 g/dL (ref 30.0–36.0)
MCV: 93.4 fL (ref 80.0–100.0)
Platelets: 298 10*3/uL (ref 150–400)
RBC: 4.56 MIL/uL (ref 3.87–5.11)
RDW: 13.5 % (ref 11.5–15.5)
WBC: 12.9 10*3/uL — ABNORMAL HIGH (ref 4.0–10.5)
nRBC: 0 % (ref 0.0–0.2)

## 2019-07-26 LAB — HIV ANTIBODY (ROUTINE TESTING W REFLEX): HIV Screen 4th Generation wRfx: NONREACTIVE

## 2019-07-26 LAB — MAGNESIUM: Magnesium: 2 mg/dL (ref 1.7–2.4)

## 2019-07-26 MED ORDER — LIP MEDEX EX OINT
TOPICAL_OINTMENT | CUTANEOUS | Status: AC
  Filled 2019-07-26: qty 7

## 2019-07-26 MED ORDER — IOHEXOL 300 MG/ML  SOLN
75.0000 mL | Freq: Once | INTRAMUSCULAR | Status: AC | PRN
  Administered 2019-07-26: 10:00:00 75 mL via INTRAVENOUS

## 2019-07-26 MED ORDER — SODIUM CHLORIDE (PF) 0.9 % IJ SOLN
INTRAMUSCULAR | Status: AC
  Filled 2019-07-26: qty 50

## 2019-07-26 NOTE — Progress Notes (Signed)
PROGRESS NOTE    [name]  WVP:[id number] DOB: 09-18-1960 DOA: 07/25/2019 PCP: Debbrah Alar, NP    Brief Narrative:  HPI per Dr. Thad Ranger Christine Rodgers is a 59 y.o. female with medical history significant of anemia, arthritis, asthma, fibromyalgia, hyperlipidemia, and conditions listed below presented to the ED for evaluation of fever and abdominal pain.  Patient states for the past 2 days she has had sharp left lower quadrant abdominal pain.  No associated emesis or diarrhea.  She is having regular nonbloody bowel movements.  Yesterday she had a fever of 102.7 F.  States about 4 days ago she was seen at urgent care as she was having swelling of a lymph node in her neck.  In addition, she was also having right-sided sinus pain/pressure and right ear pressure.  Also having rhinorrhea with clear nasal secretions.  No sore throat.  Her URI symptoms started about a week ago.  States was prescribed prednisone which she has not started yet.  ED Course: HEENT exam done in the ED unremarkable other than tenderness over the left anterior cervical lymph node.  Febrile with T-max 102 F.  Slightly tachycardic.  Not hypotensive.  Not hypoxic.  Labs showing WBC count 14.8.  Lactic acid normal.  No significant elevation of LFTs.  Lipase normal.  UA not suggestive of infection.  Blood culture x2 pending.  SARS-CoV-2 PCR test negative.  CT abdomen pelvis showing acute left lower quadrant descending colon diverticulitis with adjacent small microperforation and moderate inflammation/edema in the left pericolic gutter and retroperitoneal space.  No drainable fluid collection or abscess.  Patient received Tylenol, morphine, cefepime, metronidazole, and 2 L normal saline boluses.  Assessment & Plan:   Principal Problem:   Acute diverticulitis Active Problems:   Sepsis ([place])   Ear pain   Cervical lymphadenopathy   QT prolongation  1 sepsis secondary to acute diverticulitis with small  microperforation, POA Patient presenting with a 2-day history of fevers, left lower quadrant abdominal pain, temperature of 102 in the ED, tachycardic, leukocytosis meeting criteria for sepsis.  CT abdomen and pelvis consistent with a left lower quadrant descending colonic diverticulitis with adjacent small microperforation and moderate inflammation/edema in the left pericolic gutter and retroperitoneal space with no drainable fluid collection or abscess. Patient improving clinically.  Leukocytosis trending down.  Blood cultures pending.  Currently afebrile.  Lactic acid at 1.0.  Bowel rest.  Continue empiric IV cefepime and IV Flagyl.  GI consulted and appreciate input and recommendations.  2.  QTc prolongation Repeat EKG with resolution of QTC prolongation.  Keep magnesium > 2, keep potassium > 4.  3.  Right-sided ear/sinus pressure/cervical lymphadenopathy Tympanic membrane is noted to be normal per ED PA.  CT soft tissue neck negative for lymphadenopathy, chronic left maxillary and ethmoid sinusitis.  CT maxillofacial with no explanation for right-sided pain, no mastoid or middle ear opacification.  Chronic left sided sinusitis.  Outpatient follow-up.  4.  Asthma Stable.  Albuterol inhaler as needed.   DVT prophylaxis: SCDs Code Status: Full Family Communication: Updated patient.  No family at bedside. Disposition Plan:  . Patient came from: Home            . Anticipated d/c place: Home . Barriers to d/c OR conditions which need to be met to effect a safe d/c: Home when clinically improved, tolerating a diet, cleared by GI.   Consultants:   Gastroenterology: Dr.Danis III  Procedures:   CT abdomen and pelvis 07/25/2019  CT  soft tissue neck 07/26/2019  CT maxillofacial 07/26/2019  Antimicrobials:   IV cefepime 07/25/2019  IV Flagyl 07/25/2019   Subjective: Patient sitting up in bed.  Patient denies any emesis.  Patient states she is feeling better than she did on admission.  Still  with deep left lower quadrant abdominal pain.  Denies any chest pain or shortness of breath.  Asking whether she came eat or placed on a diet.  Objective: Vitals:   07/25/19 2334 07/26/19 0254 07/26/19 0640 07/26/19 0905  BP: (!) 143/90 116/82 130/76   Pulse: (!) 102 88 91   Resp: '18 18 16   '$ Temp: 99.3 F (37.4 C) 98.7 F (37.1 C) 98.3 F (36.8 C) 99.2 F (37.3 C)  TempSrc:    Oral  SpO2: 95% 99% 96%   Weight:      Height:        Intake/Output Summary (Last 24 hours) at 07/26/2019 1217 Last data filed at 07/26/2019 0200 Gross per 24 hour  Intake 351.45 ml  Output --  Net 351.45 ml   Filed Weights   07/25/19 1000 07/25/19 1724  Weight: 111.1 kg 111.1 kg    Examination:  General exam: Appears calm and comfortable  Respiratory system: Clear to auscultation. Respiratory effort normal. Cardiovascular system: S1 & S2 heard, RRR. No JVD, murmurs, rubs, gallops or clicks. No pedal edema. Gastrointestinal system: Abdomen is nondistended, soft and tenderness to palpation in the left lower quadrant.  Positive bowel sounds.  No rebound.  No guarding.  Central nervous system: Alert and oriented. No focal neurological deficits. Extremities: Symmetric 5 x 5 power. Skin: No rashes, lesions or ulcers Psychiatry: Judgement and insight appear normal. Mood & affect appropriate.     Data Reviewed: I have personally reviewed following labs and imaging studies  CBC: Recent Labs  Lab 07/25/19 1130 07/26/19 0003  WBC 14.8* 12.9*  NEUTROABS 10.9*  --   HGB 15.3* 13.7  HCT 46.2* 42.6  MCV 91.5 93.4  PLT 313 272   Basic Metabolic Panel: Recent Labs  Lab 07/25/19 1130 07/26/19 0003  NA 136  --   K 3.5  --   CL 103  --   CO2 24  --   GLUCOSE 105*  --   BUN 9  --   CREATININE 0.57  --   CALCIUM 9.0  --   MG  --  2.0   GFR: Estimated Creatinine Clearance: 98.5 mL/min (by C-G formula based on SCr of 0.57 mg/dL). Liver Function Tests: Recent Labs  Lab 07/25/19 1130  AST 30   ALT 47*  ALKPHOS 81  BILITOT 1.3*  PROT 7.6  ALBUMIN 4.1   Recent Labs  Lab 07/25/19 1130  LIPASE 27   No results for input(s): AMMONIA in the last 168 hours. Coagulation Profile: No results for input(s): INR, PROTIME in the last 168 hours. Cardiac Enzymes: No results for input(s): CKTOTAL, CKMB, CKMBINDEX, TROPONINI in the last 168 hours. BNP (last 3 results) No results for input(s): PROBNP in the last 8760 hours. HbA1C: No results for input(s): HGBA1C in the last 72 hours. CBG: No results for input(s): GLUCAP in the last 168 hours. Lipid Profile: No results for input(s): CHOL, HDL, LDLCALC, TRIG, CHOLHDL, LDLDIRECT in the last 72 hours. Thyroid Function Tests: No results for input(s): TSH, T4TOTAL, FREET4, T3FREE, THYROIDAB in the last 72 hours. Anemia Panel: No results for input(s): VITAMINB12, FOLATE, FERRITIN, TIBC, IRON, RETICCTPCT in the last 72 hours. Sepsis Labs: Recent Labs  Lab 07/25/19  1338  LATICACIDVEN 1.0    Recent Results (from the past 240 hour(s))  SARS CORONAVIRUS 2 (TAT 6-24 HRS) Nasopharyngeal Nasopharyngeal Swab     Status: None   Collection Time: 07/25/19  1:31 PM   Specimen: Nasopharyngeal Swab  Result Value Ref Range Status   SARS Coronavirus 2 NEGATIVE NEGATIVE Final    Comment: (NOTE) SARS-CoV-2 target nucleic acids are NOT DETECTED. The SARS-CoV-2 RNA is generally detectable in upper and lower respiratory specimens during the acute phase of infection. Negative results do not preclude SARS-CoV-2 infection, do not rule out co-infections with other pathogens, and should not be used as the sole basis for treatment or other patient management decisions. Negative results must be combined with clinical observations, patient history, and epidemiological information. The expected result is Negative. Fact Sheet for Patients: SugarRoll.be Fact Sheet for Healthcare  Providers: https://www.woods-mathews.com/ This test is not yet approved or cleared by the [place] FDA and  has been authorized for detection and/or diagnosis of SARS-CoV-2 by FDA under an Emergency Use Authorization (EUA). This EUA will remain  in effect (meaning this test can be used) for the duration of the COVID-19 declaration under Section 56 4(b)(1) of the Act, 21 U.S.C. section 360bbb-3(b)(1), unless the authorization is terminated or revoked sooner. Performed at [facility], [place] [address], [place], [place] [address]   Blood culture (routine x 2)     Status: None (Preliminary result)   Collection Time: 07/25/19  1:39 PM   Specimen: BLOOD RIGHT ARM  Result Value Ref Range Status   Specimen Description   Final    BLOOD RIGHT ARM Performed at [facility], [address] [place], [place] [address]    Special Requests   Final    BOTTLES DRAWN AEROBIC AND ANAEROBIC Blood Culture adequate volume Performed at [facility], [address] [place], [place] [address]    Culture   Final    NO GROWTH < 24 HOURS Performed at [facility], [place] [address], [place], [place] [address]    Report Status PENDING  Incomplete         Radiology Studies: CT SOFT TISSUE NECK W CONTRAST  Result Date: 07/26/2019 CLINICAL DATA:  Neck pain and fever with enlarged neck node. Right-sided sinus pressure and pain. EXAM: CT NECK WITH CONTRAST TECHNIQUE: Multidetector CT imaging of the neck was performed using the standard protocol following the bolus administration of intravenous contrast. CONTRAST:  54m OMNIPAQUE IOHEXOL 300 MG/ML  SOLN COMPARISON:  None. FINDINGS: Pharynx and larynx: No evidence of mass or abnormal enhancement. Bilateral palatine tonsilliths. Salivary glands: No inflammation, mass, or stone. Thyroid: Normal. Lymph nodes: Symmetric prominence of cervical lymph nodes with no worrisome enlargement or heterogeneity. Vascular:  Unremarkable. Limited intracranial: Negative Visualized orbits: Negative Mastoids and visualized paranasal sinuses: Chronic left maxillary and ethmoid sinusitis as described on maxillofacial CT performed at the same time. Skeleton: No acute or erosive finding. There is C5-6 disc narrowing with mild ridging. Torus mandibularis. Upper chest: Clear apical lungs. IMPRESSION: 1. Negative for lymphadenopathy. 2. Chronic left maxillary and ethmoid sinusitis. Electronically Signed   By: JMonte FantasiaM.D.   On: 07/26/2019 10:39   CT Abdomen Pelvis W Contrast  Result Date: 07/25/2019 CLINICAL DATA:  Left lower quadrant pain with nausea and fever EXAM: CT ABDOMEN AND PELVIS WITH CONTRAST TECHNIQUE: Multidetector CT imaging of the abdomen and pelvis was performed using the standard protocol following bolus administration of intravenous  contrast. CONTRAST:  158m OMNIPAQUE IOHEXOL 300 MG/ML  SOLN COMPARISON:  None. FINDINGS: Lower chest: Scattered bibasilar subsegmental atelectasis and or scarring. Normal heart size. No pericardial or pleural effusion. Small hiatal hernia noted. Degenerative changes thoracic spine with osteophytes on the right. Hepatobiliary: Diffuse hypoattenuation liver compatible with hepatic steatosis. Mild associated hepatomegaly measuring 20 cm in length. No focal hepatic abnormality or intrahepatic biliary dilatation. Hepatic and portal veins are patent. Gallbladder and biliary system unremarkable. Common bile duct nondilated. Pancreas: Unremarkable. No pancreatic ductal dilatation or surrounding inflammatory changes. Spleen: Normal in size without focal abnormality. Accessory splenule noted. Adrenals/Urinary Tract: Adrenal glands are unremarkable. Kidneys are normal, without renal calculi, focal lesion, or hydronephrosis. Bladder is unremarkable. Stomach/Bowel: Negative for bowel obstruction, significant dilatation, ileus, or free air. Normal appendix in the right lower quadrant. In the left lower  quadrant, there is diverticular disease noted with scattered diverticula and left descending colon pericolonic strandy edema/inflammation along the left retroperitoneal space. Adjacent locules of extraluminal air noted in the left pericolonic fat, images 62. Findings compatible with localized acute diverticulitis and adjacent micro perforation. No associated significant drainable fluid collection or abscess. Vascular/Lymphatic: Aortoiliac minor atherosclerosis without occlusive process, aneurysm or acute vascular process. Mesenteric and renal vasculature appear patent. No veno-occlusive process. No bulky adenopathy. Reproductive: Uterus and adnexa normal in size. Nonspecific mild prominence of the ovarian veins bilaterally remains nonspecific. No pelvic free fluid or fluid collection. Other: No abdominal wall hernia or abnormality. No abdominopelvic ascites. Musculoskeletal: Degenerative changes of the spine with lower lumbar facet arthropathy. IMPRESSION: Acute left lower quadrant descending colon diverticulitis with adjacent small micro perforation and moderate inflammation/edema in the left pericolic gutter and retroperitoneal space. No drainable fluid collection or abscess. Hepatic steatosis Aortic Atherosclerosis (ICD10-I70.0). Electronically Signed   By: MJerilynn Mages  Shick M.D.   On: 07/25/2019 13:17   CT MAXILLOFACIAL W CONTRAST  Result Date: 07/26/2019 CLINICAL DATA:  Fever and right ear pain EXAM: CT MAXILLOFACIAL WITH CONTRAST TECHNIQUE: Multidetector CT imaging of the maxillofacial structures was performed with intravenous contrast. Multiplanar CT image reconstructions were also generated. CONTRAST:  740mOMNIPAQUE IOHEXOL 300 MG/ML  SOLN COMPARISON:  None. FINDINGS: Osseous: No evidence of fracture or erosion. No evidence of odontogenic infection. Orbits: Negative.  No evidence of mass or inflammation Sinuses: Chronic left maxillary sinusitis with sclerotic wall thickening and near complete opacification. The  ostium and infundibulum patent. Milder opacification of left ethmoid air cells. The nasal septum is deviated and there is congested appearing left-sided nasal mucosa, likely nasal cycle. Clear mastoids and middle ears. Soft tissues: No evidence of inflammation. Specifically no evidence of parotiditis or otitis externa. Incidental tonsilliths. Limited intracranial: Negative IMPRESSION: 1. No explanation for right-sided pain. No mastoid or middle ear opacification. 2. Chronic left-sided sinusitis. Electronically Signed   By: JoMonte Fantasia.D.   On: 07/26/2019 10:35        Scheduled Meds: . sodium chloride (PF)       Continuous Infusions: . sodium chloride Stopped (07/26/19 0100)  . ceFEPime (MAXIPIME) IV 2 g (07/26/19 0448)  . metronidazole 500 mg (07/26/19 0543)     LOS: 1 day    Time spent: 35 minutes    DaIrine SealMD [facility]   To contact the attending provider between 7A-7P or the covering provider during after hours 7P-7A, please log into the web site [email or web address] and access using universal [place] password for that web site. If you do not have the password, please call the hospital  operator.  07/26/2019, 12:17 PM

## 2019-07-26 NOTE — Progress Notes (Signed)
Pharmacy Antibiotic Note  Christine Rodgers is a 59 y.o. female admitted on 07/25/2019 with intra-abdominal infection.  Pharmacy has been consulted for cefepime dosing.  Pt presenting with fever, abdominal pain. ABX started for sepsis 2/2 acute diverticulitis with small microperforation. Pt has PCN allergy listed on profile, has received 3 dose of cefepime.  Today, 07/26/19  WBC 12.9, elevated but decreasing  Renal function WNL. CrCl > 60 mL/min  Tmax 102 F  Plan:  Cefepime 2 g IV q8h  Height: 5\' 7"  (170.2 cm) Weight: 245 lb (111.1 kg) IBW/kg (Calculated) : 61.6  Temp (24hrs), Avg:99.4 F (37.4 C), Min:98.3 F (36.8 C), Max:102 F (38.9 C)  Recent Labs  Lab 07/25/19 1130 07/25/19 1338 07/26/19 0003  WBC 14.8*  --  12.9*  CREATININE 0.57  --   --   LATICACIDVEN  --  1.0  --     Estimated Creatinine Clearance: 98.5 mL/min (by C-G formula based on SCr of 0.57 mg/dL).    Allergies  Allergen Reactions  . Aspirin     REACTION: severe nausea  . Codeine     REACTION: severe nausea  . Doxycycline Hyclate     REACTION: hives, throat swelling  . Erythromycin     Rash, gi side effects.    . Penicillins     REACTION: passed out  . Sulfonamide Derivatives     REACTION: diarrhea, vomitting  . Levaquin [Levofloxacin In D5w] Rash    Antimicrobials this admission: cefepime 2/8 >>  metronidazole 2/8 >>   Dose adjustments this admission:  Microbiology results: 2/8 BCx: ngtd 2/8 UCx:   2/8 SARS-2: Negative  Lenis Noon, PharmD 07/26/2019 10:20 AM

## 2019-07-26 NOTE — Consult Note (Addendum)
Consultation  Referring Provider:     Dr. Grandville Silos Primary Care Physician:  Debbrah Alar, NP Primary Gastroenterologist: Dr. Fuller Plan       Reason for Consultation: Diverticulitis with microperforation            HPI:   Christine Rodgers is a 59 y.o. female with a past medical history significant for anemia, arthritis, asthma, fibromyalgia, hyperlipidemia and others listed below, who presented to the ED after being seen at [facility] for evaluation of fever abdominal pain with findings of diverticulitis with microperforation on CT.    Today, the patient tells me that she was feeling fine last week, but then on Saturday, 07/23/2019 she had some gas pain which seemed to worsen throughout the day and initially radiated across her lower abdomen, she then started running fevers up to 102.7 and generally felt ill, this continued into Sunday and worsened and she was just "tender and achy".  Associated symptoms include some nausea when she was running a high fever.  She then presented to [place] on Monday morning and had a CT showing diverticulitis with microperforation.  Denies any previous episodes of the same.  Tells me that she eats a high-fiber diet on a regular basis and has regular bowel movements.    Denies weight loss, blood in her stool or change in bowel habits.  ER course: CT abdomen pelvis with contrast 07/25/2019 with acute left lower quadrant descending colon diverticulitis with adjacent small microperforation and moderate inflammation/edema in the left pericolic gutter and retroperitoneal space, no drainable fluid collection or abscess, hepatic steatosis and aortic atherosclerosis; WBC yesterday 14.8--> 12.9  GI history: 02/12/2012 colonoscopy Dr. Fuller Plan: Mild diverticulosis in the descending and sigmoid colon and small internal and external hemorrhoids and otherwise normal, repeat recommended 10 years  Past Medical History:  Diagnosis Date  . Allergy   .  Anemia    occasional low hbg  . Arthritis    knees  . Asthma   . Dysrhythmia   . Eczema   . Family history of anesthesia complication    father   . Fibromyalgia   . History of chicken pox   . Hyperlipidemia   . Pneumonia    3-4 years ago    Past Surgical History:  Procedure Laterality Date  . BUNIONECTOMY  2008   right foot  . CERVICAL BIOPSY     benign  . KNEE ARTHROSCOPY Left 01/23/2014   Procedure: ARTHROSCOPY KNEE;  Surgeon: Vickey Huger, MD;  Location: [place];  Service: Orthopedics;  Laterality: Left;    Family History  Problem Relation Age of Onset  . Hyperlipidemia Mother   . Hyperlipidemia Father        Deceased 69- sepsis/?pneumonia  . Arrhythmia Father   . Diabetes Paternal Grandmother   . Stroke Paternal Grandmother   . Stroke Paternal Grandfather   . Ulcerative colitis Daughter   . Diabetes Maternal Grandmother   . Diabetes Maternal Grandfather   . Colon cancer Neg Hx   . Stomach cancer Neg Hx     Social History   Tobacco Use  . Smoking status: Former Smoker    Packs/day: 0.50    Years: 2.00    Pack years: 1.00    Types: Cigarettes    Quit date: 01/18/2004    Years since quitting: 15.5  . Smokeless tobacco: Never Used  Substance Use Topics  . Alcohol use: Yes    Alcohol/week: 7.0 standard drinks  Types: 7 Glasses of wine per week    Comment: occ  . Drug use: No    Prior to Admission medications   Medication Sig Start Date End Date Taking? Authorizing Provider  albuterol (PROVENTIL HFA;VENTOLIN HFA) 108 (90 Base) MCG/ACT inhaler Inhale 2 puffs into the lungs every 6 (six) hours as needed for wheezing or shortness of breath. 08/31/18  Yes Saguier, Percell Miller, PA-C  cholecalciferol (VITAMIN D3) 25 MCG (1000 UNIT) tablet Take 1,000 Units by mouth daily.   Yes [provider]  co-enzyme Q-10 30 MG capsule Take 30 mg by mouth daily.   Yes [provider]  Elderberry 575 MG/5ML SYRP Take 5 mLs by mouth daily.   Yes [provider]  Nutritional Supplements (GRAPESEED EXTRACT PO) Take 10 drops by mouth daily.   Yes [provider]  psyllium (REGULOID) 0.52 g capsule Take 0.104 g by mouth daily.    Yes [provider]  Turmeric 500 MG TABS Take 250 tablets by mouth daily.   Yes [provider]  albuterol (PROVENTIL HFA;VENTOLIN HFA) 108 (90 BASE) MCG/ACT inhaler Inhale 2 puffs into the lungs every 6 (six) hours as needed for wheezing. Patient not taking: Reported on 07/25/2019 10/29/11   Debbrah Alar, NP  benzonatate (TESSALON) 100 MG capsule Take 1 capsule (100 mg total) by mouth 3 (three) times daily as needed for cough. Patient not taking: Reported on 07/25/2019 08/31/18   Saguier, Percell Miller, PA-C  cefdinir (OMNICEF) 300 MG capsule Take 1 capsule (300 mg total) by mouth 2 (two) times daily. Patient not taking: Reported on 07/25/2019 08/17/17   Debbrah Alar, NP  fluticasone [facility]) 50 MCG/ACT nasal spray Place 2 sprays into both nostrils daily. Patient not taking: Reported on 07/25/2019 08/31/18   Saguier, Percell Miller, PA-C  meclizine (ANTIVERT) 25 MG tablet Take 1 tablet (25 mg total) by mouth 3 (three) times daily as needed for dizziness. Patient not taking: Reported on 07/25/2019 08/17/17   Debbrah Alar, NP  mometasone (NASONEX) 50 MCG/ACT nasal spray Place 2 sprays into the nose daily. Patient not taking: Reported on 07/25/2019 08/06/17   Debbrah Alar, NP  predniSONE (DELTASONE) 1 MG tablet 4 tabs by mouth once daily for 5 days. Patient not taking: Reported on 07/25/2019 08/17/17   Debbrah Alar, NP    Current Facility-Administered Medications  Medication Dose Route Frequency Provider Last Rate Last Admin  . 0.9 %  sodium chloride infusion   Intravenous Continuous [name], MD   Stopped at 07/26/19 0100  . acetaminophen (TYLENOL) tablet 650 mg  650 mg Oral Q6H PRN [name], MD   650 mg at 07/26/19 0845   Or  . acetaminophen (TYLENOL) suppository 650 mg  650 mg  Rectal Q6H PRN [name], MD      . albuterol (PROVENTIL) (2.5 MG/3ML) 0.083% nebulizer solution 2.5 mg  2.5 mg Nebulization Q6H PRN Eugenie Filler, MD      . ceFEPIme (MAXIPIME) 2 g in sodium chloride 0.9 % 100 mL IVPB  2 g Intravenous Q8H Eugenie Filler, MD 200 mL/hr at 07/26/19 0448 2 g at 07/26/19 0448  . metroNIDAZOLE (FLAGYL) IVPB 500 mg  500 mg Intravenous Quay Burow, MD 100 mL/hr at 07/26/19 0543 500 mg at 07/26/19 0543  . morphine 2 MG/ML injection 1 mg  1 mg Intravenous Q3H PRN [name], MD   1 mg at 07/26/19 0321  . sodium chloride (PF) 0.9 % injection  Allergies as of 07/25/2019 - Review Complete 07/25/2019  Allergen Reaction Noted  . Aspirin  02/27/2010  . Codeine  02/27/2010  . Doxycycline hyclate  03/06/2010  . Erythromycin  08/17/2017  . Penicillins  02/27/2010  . Sulfonamide derivatives  02/27/2010  . Levaquin [levofloxacin in d5w] Rash 10/28/2011     Review of Systems:    Constitutional: No weight loss, fever or chills Skin: No rash Cardiovascular: No chest pain Respiratory: No SOB Gastrointestinal: See HPI and otherwise negative Genitourinary: No dysuria Neurological: No headache Musculoskeletal: No new muscle or joint pain Hematologic: No bleeding  Psychiatric: No history of depression or anxiety    Physical Exam:  Vital signs in last 24 hours: Temp:  [98.3 F (36.8 C)-102 F (38.9 C)] 99.2 F (37.3 C) (02/09 0905) Pulse Rate:  [88-112] 91 (02/09 0640) Resp:  [16-22] 16 (02/09 0640) BP: (111-154)/(75-94) 130/76 (02/09 0640) SpO2:  [95 %-100 %] 96 % (02/09 0640) Weight:  [111.1 kg] 111.1 kg (02/08 1724) Last BM Date: 07/25/19 General:   Pleasant overweight Caucasian female appears to be in NAD, Well developed, Well nourished, alert and cooperative Head:  Normocephalic and atraumatic. Eyes:   PEERL, EOMI. No icterus. Conjunctiva pink. Ears:  Normal auditory acuity. Neck:  Supple Throat: Oral cavity  and pharynx without inflammation, swelling or lesion. Lungs: Respirations even and unlabored. Lungs clear to auscultation bilaterally.   No wheezes, crackles, or rhonchi.  Heart: Normal S1, S2. No MRG. Regular rate and rhythm. No peripheral edema, cyanosis or pallor.  Abdomen:  Soft, nondistended, Marked LLQ ttp with involuntary guarding. Normal bowel sounds. No appreciable masses or hepatomegaly. Rectal:  Not performed.  Msk:  Symmetrical without gross deformities. Peripheral pulses intact.  Extremities:  Without edema, no deformity or joint abnormality.  Neurologic:  Alert and  oriented x4;  grossly normal neurologically.  Skin:   Dry and intact without significant lesions or rashes. Psychiatric: Demonstrates good judgement and reason without abnormal affect or behaviors.   LAB RESULTS: Recent Labs    07/25/19 1130 07/26/19 0003  WBC 14.8* 12.9*  HGB 15.3* 13.7  HCT 46.2* 42.6  PLT 313 298   BMET Recent Labs    07/25/19 1130  NA 136  K 3.5  CL 103  CO2 24  GLUCOSE 105*  BUN 9  CREATININE 0.57  CALCIUM 9.0   LFT Recent Labs    07/25/19 1130  PROT 7.6  ALBUMIN 4.1  AST 30  ALT 47*  ALKPHOS 81  BILITOT 1.3*   STUDIES: CT SOFT TISSUE NECK W CONTRAST  Result Date: 07/26/2019 CLINICAL DATA:  Neck pain and fever with enlarged neck node. Right-sided sinus pressure and pain. EXAM: CT NECK WITH CONTRAST TECHNIQUE: Multidetector CT imaging of the neck was performed using the standard protocol following the bolus administration of intravenous contrast. CONTRAST:  66mL OMNIPAQUE IOHEXOL 300 MG/ML  SOLN COMPARISON:  None. FINDINGS: Pharynx and larynx: No evidence of mass or abnormal enhancement. Bilateral palatine tonsilliths. Salivary glands: No inflammation, mass, or stone. Thyroid: Normal. Lymph nodes: Symmetric prominence of cervical lymph nodes with no worrisome enlargement or heterogeneity. Vascular: Unremarkable. Limited intracranial: Negative Visualized orbits: Negative  Mastoids and visualized paranasal sinuses: Chronic left maxillary and ethmoid sinusitis as described on maxillofacial CT performed at the same time. Skeleton: No acute or erosive finding. There is C5-6 disc narrowing with mild ridging. Torus mandibularis. Upper chest: Clear apical lungs. IMPRESSION: 1. Negative for lymphadenopathy. 2. Chronic left maxillary and ethmoid sinusitis. Electronically Signed  By: Monte Fantasia M.D.   On: 07/26/2019 10:39   CT Abdomen Pelvis W Contrast  Result Date: 07/25/2019 CLINICAL DATA:  Left lower quadrant pain with nausea and fever EXAM: CT ABDOMEN AND PELVIS WITH CONTRAST TECHNIQUE: Multidetector CT imaging of the abdomen and pelvis was performed using the standard protocol following bolus administration of intravenous contrast. CONTRAST:  140mL OMNIPAQUE IOHEXOL 300 MG/ML  SOLN COMPARISON:  None. FINDINGS: Lower chest: Scattered bibasilar subsegmental atelectasis and or scarring. Normal heart size. No pericardial or pleural effusion. Small hiatal hernia noted. Degenerative changes thoracic spine with osteophytes on the right. Hepatobiliary: Diffuse hypoattenuation liver compatible with hepatic steatosis. Mild associated hepatomegaly measuring 20 cm in length. No focal hepatic abnormality or intrahepatic biliary dilatation. Hepatic and portal veins are patent. Gallbladder and biliary system unremarkable. Common bile duct nondilated. Pancreas: Unremarkable. No pancreatic ductal dilatation or surrounding inflammatory changes. Spleen: Normal in size without focal abnormality. Accessory splenule noted. Adrenals/Urinary Tract: Adrenal glands are unremarkable. Kidneys are normal, without renal calculi, focal lesion, or hydronephrosis. Bladder is unremarkable. Stomach/Bowel: Negative for bowel obstruction, significant dilatation, ileus, or free air. Normal appendix in the right lower quadrant. In the left lower quadrant, there is diverticular disease noted with scattered diverticula  and left descending colon pericolonic strandy edema/inflammation along the left retroperitoneal space. Adjacent locules of extraluminal air noted in the left pericolonic fat, images 62. Findings compatible with localized acute diverticulitis and adjacent micro perforation. No associated significant drainable fluid collection or abscess. Vascular/Lymphatic: Aortoiliac minor atherosclerosis without occlusive process, aneurysm or acute vascular process. Mesenteric and renal vasculature appear patent. No veno-occlusive process. No bulky adenopathy. Reproductive: Uterus and adnexa normal in size. Nonspecific mild prominence of the ovarian veins bilaterally remains nonspecific. No pelvic free fluid or fluid collection. Other: No abdominal wall hernia or abnormality. No abdominopelvic ascites. Musculoskeletal: Degenerative changes of the spine with lower lumbar facet arthropathy. IMPRESSION: Acute left lower quadrant descending colon diverticulitis with adjacent small micro perforation and moderate inflammation/edema in the left pericolic gutter and retroperitoneal space. No drainable fluid collection or abscess. Hepatic steatosis Aortic Atherosclerosis (ICD10-I70.0). Electronically Signed   By: Jerilynn Mages.  Shick M.D.   On: 07/25/2019 13:17   CT MAXILLOFACIAL W CONTRAST  Result Date: 07/26/2019 CLINICAL DATA:  Fever and right ear pain EXAM: CT MAXILLOFACIAL WITH CONTRAST TECHNIQUE: Multidetector CT imaging of the maxillofacial structures was performed with intravenous contrast. Multiplanar CT image reconstructions were also generated. CONTRAST:  66mL OMNIPAQUE IOHEXOL 300 MG/ML  SOLN COMPARISON:  None. FINDINGS: Osseous: No evidence of fracture or erosion. No evidence of odontogenic infection. Orbits: Negative.  No evidence of mass or inflammation Sinuses: Chronic left maxillary sinusitis with sclerotic wall thickening and near complete opacification. The ostium and infundibulum patent. Milder opacification of left ethmoid air  cells. The nasal septum is deviated and there is congested appearing left-sided nasal mucosa, likely nasal cycle. Clear mastoids and middle ears. Soft tissues: No evidence of inflammation. Specifically no evidence of parotiditis or otitis externa. Incidental tonsilliths. Limited intracranial: Negative IMPRESSION: 1. No explanation for right-sided pain. No mastoid or middle ear opacification. 2. Chronic left-sided sinusitis. Electronically Signed   By: Monte Fantasia M.D.   On: 07/26/2019 10:35    Impression / Plan:   Impression: 1.  Acute diverticulitis with microperforation: As on CT above, 2 days of left lower quadrant pain, feeling some better today after antibiotics overnight 2.  Leukocytosis: From above  Plan: 1.  Discussed with the patient that she can likely be started  on a clear liquid diet 2.  May need to discuss with general surgery if worsens 3.  Agree with antibiotics 4.  Please await any further recommendations from Dr. Loletha Carrow later today.  Thank you for your kind consultation, we will continue to follow.  Lavone Nian [facility]  07/26/2019, 10:48 AM   I have reviewed the entire case in detail with the above APP and discussed the plan in detail.  Therefore, I agree with the diagnoses recorded above. In addition,  I have personally interviewed and examined the patient and have personally reviewed any abdominal/pelvic CT scan images -good quality study.  My additional thoughts are as follows:  Left lower quadrant pain from acute diverticulitis with microperforation.  She is improving since admission with decreasing white blood cell count, resolution of fever and decreasing pain.  I put her on a full liquid diet, continue current antibiotic course, and I anticipate she will be here another day or 2 requiring IV antibiotics before discharge home. If continue to improve tomorrow, please change her to oral pain medication.   Nelida Meuse III Office:[phone]

## 2019-07-27 DIAGNOSIS — K5720 Diverticulitis of large intestine with perforation and abscess without bleeding: Secondary | ICD-10-CM

## 2019-07-27 LAB — CBC WITH DIFFERENTIAL/PLATELET
Abs Immature Granulocytes: 0.03 10*3/uL (ref 0.00–0.07)
Basophils Absolute: 0 10*3/uL (ref 0.0–0.1)
Basophils Relative: 0 %
Eosinophils Absolute: 0.3 10*3/uL (ref 0.0–0.5)
Eosinophils Relative: 4 %
HCT: 40.1 % (ref 36.0–46.0)
Hemoglobin: 13 g/dL (ref 12.0–15.0)
Immature Granulocytes: 0 %
Lymphocytes Relative: 23 %
Lymphs Abs: 2.2 10*3/uL (ref 0.7–4.0)
MCH: 30.3 pg (ref 26.0–34.0)
MCHC: 32.4 g/dL (ref 30.0–36.0)
MCV: 93.5 fL (ref 80.0–100.0)
Monocytes Absolute: 0.9 10*3/uL (ref 0.1–1.0)
Monocytes Relative: 9 %
Neutro Abs: 5.9 10*3/uL (ref 1.7–7.7)
Neutrophils Relative %: 64 %
Platelets: 272 10*3/uL (ref 150–400)
RBC: 4.29 MIL/uL (ref 3.87–5.11)
RDW: 13.4 % (ref 11.5–15.5)
WBC: 9.4 10*3/uL (ref 4.0–10.5)
nRBC: 0 % (ref 0.0–0.2)

## 2019-07-27 LAB — MAGNESIUM: Magnesium: 2 mg/dL (ref 1.7–2.4)

## 2019-07-27 LAB — BASIC METABOLIC PANEL
Anion gap: 8 (ref 5–15)
BUN: 6 mg/dL (ref 6–20)
CO2: 25 mmol/L (ref 22–32)
Calcium: 8.6 mg/dL — ABNORMAL LOW (ref 8.9–10.3)
Chloride: 108 mmol/L (ref 98–111)
Creatinine, Ser: 0.58 mg/dL (ref 0.44–1.00)
GFR calc Af Amer: >60 mL/min (ref >60)
GFR calc non Af Amer: >60 mL/min (ref >60)
Glucose, Bld: 108 mg/dL — ABNORMAL HIGH (ref 70–99)
Potassium: 3.7 mmol/L (ref 3.5–5.1)
Sodium: 141 mmol/L (ref 135–145)

## 2019-07-27 NOTE — Progress Notes (Signed)
[facility] Progress Note  Chief Complaint: Descending colon diverticulitis  History:  Christine Rodgers is feeling better today, with decreased left lower quadrant pain.  She has tolerated a full liquid diet since last evening, but "taking it slow".  No lower GI bleeding, no recurrence of fever.  Still has a headache.  ROS: Cardiovascular: Denies chest pain Respiratory: Denies dyspnea Urinary: Denies dysuria  Objective:   Current Facility-Administered Medications:  .  0.9 %  sodium chloride infusion, , Intravenous, Continuous, Christine Leff, MD, Last Rate: 125 mL/hr at 07/26/19 2337, New Bag at 07/26/19 2337 .  acetaminophen (TYLENOL) tablet 650 mg, 650 mg, Oral, Q6H PRN, 650 mg at 07/27/19 0501 **OR** acetaminophen (TYLENOL) suppository 650 mg, 650 mg, Rectal, Q6H PRN, Christine Leff, MD .  albuterol (PROVENTIL) (2.5 MG/3ML) 0.083% nebulizer solution 2.5 mg, 2.5 mg, Nebulization, Q6H PRN, Eugenie Filler, MD .  ceFEPIme (MAXIPIME) 2 g in sodium chloride 0.9 % 100 mL IVPB, 2 g, Intravenous, Q8H, Eugenie Filler, MD, Last Rate: 200 mL/hr at 07/27/19 0617, 2 g at 07/27/19 0617 .  metroNIDAZOLE (FLAGYL) IVPB 500 mg, 500 mg, Intravenous, Q8H, Christine Leff, MD, Last Rate: 100 mL/hr at 07/27/19 0505, 500 mg at 07/27/19 0505 .  morphine 2 MG/ML injection 1 mg, 1 mg, Intravenous, Q3H PRN, Christine Leff, MD, 1 mg at 07/26/19 0321  . sodium chloride 125 mL/hr at 07/26/19 2337  . ceFEPime (MAXIPIME) IV 2 g (07/27/19 0617)  . metronidazole 500 mg (07/27/19 0505)     Vital signs in last 24 hrs: Vitals:   07/26/19 2108 07/27/19 0435  BP: (!) 144/90 (!) 143/97  Pulse: 88 86  Resp: 18 18  Temp: 99.1 F (37.3 C) 98.4 F (36.9 C)  SpO2: 95% 100%    Intake/Output Summary (Last 24 hours) at 07/27/2019 0912 Last data filed at 07/27/2019 0900 Gross per 24 hour  Intake 480 ml  Output --  Net 480 ml     Physical Exam   HEENT: sclera anicteric, oral mucosa without  lesions  Neck: supple, no thyromegaly, JVD or lymphadenopathy  Cardiac: RRR without murmurs, S1S2 heard, no peripheral edema  Pulm: clear to auscultation bilaterally, normal RR and effort noted  Abdomen: soft, LLQ tenderness (improved from yesterday), with active bowel sounds. No guarding or palpable hepatosplenomegaly  Skin; warm and dry, no jaundice  Recent Labs:  CBC Latest Ref Rng & Units 07/27/2019 07/26/2019 07/25/2019  WBC 4.0 - 10.5 K/uL 9.4 12.9(H) 14.8(H)  Hemoglobin 12.0 - 15.0 g/dL 13.0 13.7 15.3(H)  Hematocrit 36.0 - 46.0 % 40.1 42.6 46.2(H)  Platelets 150 - 400 K/uL 272 298 313    No results for input(s): INR in the last 168 hours. CMP Latest Ref Rng & Units 07/27/2019 07/25/2019 01/17/2014  Glucose 70 - 99 mg/dL 108(H) 105(H) 101(H)  BUN 6 - 20 mg/dL 6 9 13   Creatinine 0.44 - 1.00 mg/dL 0.58 0.57 0.60  Sodium 135 - 145 mmol/L 141 136 139  Potassium 3.5 - 5.1 mmol/L 3.7 3.5 4.3  Chloride 98 - 111 mmol/L 108 103 101  CO2 22 - 32 mmol/L 25 24 26   Calcium 8.9 - 10.3 mg/dL 8.6(L) 9.0 9.5  Total Protein 6.5 - 8.1 g/dL - 7.6 -  Total Bilirubin 0.3 - 1.2 mg/dL - 1.3(H) -  Alkaline Phos 38 - 126 U/L - 81 -  AST 15 - 41 U/L - 30 -  ALT 0 - 44 U/L - 47(H) -     Radiologic studies: None  today  @ASSESSMENTPLANBEGIN @ Assessment: Descending colon diverticulitis with contained microperforation. Left lower quadrant pain  She is improved overall, with resolution of fever and leukocytosis, decreasing pain and tenderness.  Plan: Continue IV antibiotics today Changed to oral pain medication today (reports allergy to codeine) Increase to low residue diet today, but she feels she will continue to take that slowly. If doing this well tomorrow morning, she can be discharged tomorrow on oral antibiotics.  She will need both metronidazole and appropriate gram-negative coverage in consideration of her allergies.  We will arrange office follow-up with Dr. Lucio Edward, her primary GI  doctor.  Signing off, call or message with questions.  Total time 25 minutes  Nelida Meuse III Office: [phone]

## 2019-07-27 NOTE — Plan of Care (Signed)
  Problem: Education: Goal: Knowledge of General Education information will improve Description: Including pain rating scale, medication(s)/side effects and non-pharmacologic comfort measures Outcome: Progressing   Problem: Health Behavior/Discharge Planning: Goal: Ability to manage health-related needs will improve Outcome: Progressing   Problem: Clinical Measurements: Goal: Ability to maintain clinical measurements within normal limits will improve Outcome: Progressing Goal: Diagnostic test results will improve Outcome: Progressing Goal: Respiratory complications will improve Outcome: Progressing Goal: Cardiovascular complication will be avoided Outcome: Progressing   Problem: Activity: Goal: Risk for activity intolerance will decrease Outcome: Progressing   Problem: Nutrition: Goal: Adequate nutrition will be maintained Outcome: Progressing   Problem: Coping: Goal: Level of anxiety will decrease Outcome: Progressing

## 2019-07-27 NOTE — Progress Notes (Signed)
PROGRESS NOTE    Christine Rodgers  [id number] DOB: [birth year] DOA: 07/25/2019 PCP: Debbrah Alar, NP    Brief Narrative:  59 y.o.femalewith medical history significant ofanemia, arthritis, asthma, fibromyalgia, hyperlipidemia, and conditions listed below presented to the ED for evaluation of feverand abdominal pain. Patient states for the past 2 days she has had sharp left lower quadrant abdominal pain. No associated emesis or diarrhea. She is having regular nonbloody bowel movements. Yesterday she had a fever of 102.7 F. States about 4 days ago she was seen at urgent care as she was having swelling of alymph node in her neck. In addition, she was also having right-sided sinus pain/pressure and right ear pressure. Also having rhinorrhea with clear nasal secretions. No sore throat. Her URI symptoms started about a week ago. States was prescribed prednisone which she has not started yet.  ED Course:HEENTexam done in the ED unremarkable other than tenderness over the left anterior cervical lymph node. Febrile with T-max 102 F. Slightly tachycardic. Not hypotensive. Not hypoxic. Labs showing WBC count 14.8. Lactic acid normal. No significant elevation of LFTs. Lipase normal. UA not suggestive of infection. Blood culture x2 pending. SARS-CoV-2 PCR test negative. CT abdomen pelvis showing acute left lower quadrant descending colon diverticulitis with adjacent small microperforation and moderate inflammation/edema in the left pericolic gutter and retroperitoneal space. No drainable fluid collection or abscess.  Patient received Tylenol, morphine, cefepime, metronidazole, and 2 L normal saline boluses.  Assessment & Plan:   Principal Problem:   Acute diverticulitis Active Problems:   Sepsis ([place])   Ear pain   Cervical lymphadenopathy   QT prolongation  1 sepsis secondary to acute diverticulitis with small microperforation, POA Patient presenting with a  2-day history of fevers, left lower quadrant abdominal pain, temperature of 102 in the ED, tachycardic, leukocytosis meeting criteria for sepsis.  CT abdomen and pelvis consistent with a left lower quadrant descending colonic diverticulitis with adjacent small microperforation and moderate inflammation/edema in the left pericolic gutter and retroperitoneal space with no drainable fluid collection or abscess. Patient improving clinically.  Leukocytosis trending down.  Continue empiric IV cefepime and IV Flagyl.   GI consulted and appreciate input and recommendations. Plan to cont abx and possible transition to PO tomorrow if stable  2.  QTc prolongation Repeat EKG with resolution of QTC prolongation.  Keep magnesium > 2, keep potassium > 4.  3.  Right-sided ear/sinus pressure/cervical lymphadenopathy Tympanic membrane is noted to be normal per ED PA.  CT soft tissue neck negative for lymphadenopathy, chronic left maxillary and ethmoid sinusitis.  CT maxillofacial with no explanation for right-sided pain, no mastoid or middle ear opacification.  Chronic left sided sinusitis.   Recommend outpatient follow-up.  4.  Asthma Remains stable.  Albuterol inhaler as needed.  DVT prophylaxis: SCD's Code Status: Full Family Communication: Pt in room, family not at bedside Disposition Plan: Home likely in 24hrs  Consultants:   GI  Procedures:     Antimicrobials: Anti-infectives (From admission, onward)   Start     Dose/Rate Route Frequency Ordered Stop   07/25/19 2315  metroNIDAZOLE (FLAGYL) IVPB 500 mg     500 mg 100 mL/hr over 60 Minutes Intravenous Every 8 hours 07/25/19 2304     07/25/19 2315  ceFEPIme (MAXIPIME) 2 g in sodium chloride 0.9 % 100 mL IVPB     2 g 200 mL/hr over 30 Minutes Intravenous Every 8 hours 07/25/19 2311     07/25/19 1345  ceFEPIme (MAXIPIME) 2 g  in sodium chloride 0.9 % 100 mL IVPB     2 g 200 mL/hr over 30 Minutes Intravenous  Once 07/25/19 1342 07/25/19 1422     07/25/19 1345  metroNIDAZOLE (FLAGYL) IVPB 500 mg     500 mg 100 mL/hr over 60 Minutes Intravenous  Once 07/25/19 1342 07/25/19 1539       Subjective: Eager to go home soon  Objective: Vitals:   07/26/19 1454 07/26/19 2108 07/27/19 0435 07/27/19 1429  BP: (!) 140/96 (!) 144/90 (!) 143/97 139/90  Pulse:  88 86 85  Resp:  18 18 (!) 22  Temp:  99.1 F (37.3 C) 98.4 F (36.9 C) 98.3 F (36.8 C)  TempSrc:  Oral Oral Oral  SpO2:  95% 100% 98%  Weight:      Height:        Intake/Output Summary (Last 24 hours) at 07/27/2019 1540 Last data filed at 07/27/2019 0900 Gross per 24 hour  Intake 480 ml  Output --  Net 480 ml   Filed Weights   07/25/19 1000 07/25/19 1724  Weight: 111.1 kg 111.1 kg    Examination:  General exam: Appears calm and comfortable  Respiratory system: Clear to auscultation. Respiratory effort normal. Cardiovascular system: S1 & S2 heard, Regular Gastrointestinal system: Abdomen is nondistended, soft and nontender. No organomegaly or masses felt. Normal bowel sounds heard. Central nervous system: Alert and oriented. No focal neurological deficits. Extremities: Symmetric 5 x 5 power. Skin: No rashes, lesions Psychiatry: Judgement and insight appear normal. Mood & affect appropriate.   Data Reviewed: I have personally reviewed following labs and imaging studies  CBC: Recent Labs  Lab 07/25/19 1130 07/26/19 0003 07/27/19 0422  WBC 14.8* 12.9* 9.4  NEUTROABS 10.9*  --  5.9  HGB 15.3* 13.7 13.0  HCT 46.2* 42.6 40.1  MCV 91.5 93.4 93.5  PLT 313 298 [id number]   Basic Metabolic Panel: Recent Labs  Lab 07/25/19 1130 07/26/19 0003 07/27/19 0422  NA 136  --  141  K 3.5  --  3.7  CL 103  --  108  CO2 24  --  25  GLUCOSE 105*  --  108*  BUN 9  --  6  CREATININE 0.57  --  0.58  CALCIUM 9.0  --  8.6*  MG  --  2.0 2.0   GFR: Estimated Creatinine Clearance: 98.5 mL/min (by C-G formula based on SCr of 0.58 mg/dL). Liver Function Tests: Recent Labs   Lab 07/25/19 1130  AST 30  ALT 47*  ALKPHOS 81  BILITOT 1.3*  PROT 7.6  ALBUMIN 4.1   Recent Labs  Lab 07/25/19 1130  LIPASE 27   No results for input(s): AMMONIA in the last 168 hours. Coagulation Profile: No results for input(s): INR, PROTIME in the last 168 hours. Cardiac Enzymes: No results for input(s): CKTOTAL, CKMB, CKMBINDEX, TROPONINI in the last 168 hours. BNP (last 3 results) No results for input(s): PROBNP in the last 8760 hours. HbA1C: No results for input(s): HGBA1C in the last 72 hours. CBG: No results for input(s): GLUCAP in the last 168 hours. Lipid Profile: No results for input(s): CHOL, HDL, LDLCALC, TRIG, CHOLHDL, LDLDIRECT in the last 72 hours. Thyroid Function Tests: No results for input(s): TSH, T4TOTAL, FREET4, T3FREE, THYROIDAB in the last 72 hours. Anemia Panel: No results for input(s): VITAMINB12, FOLATE, FERRITIN, TIBC, IRON, RETICCTPCT in the last 72 hours. Sepsis Labs: Recent Labs  Lab 07/25/19 1338  LATICACIDVEN 1.0    Recent Results (from the  past 240 hour(s))  SARS CORONAVIRUS 2 (TAT 6-24 HRS) Nasopharyngeal Nasopharyngeal Swab     Status: None   Collection Time: 07/25/19  1:31 PM   Specimen: Nasopharyngeal Swab  Result Value Ref Range Status   SARS Coronavirus 2 NEGATIVE NEGATIVE Final    Comment: (NOTE) SARS-CoV-2 target nucleic acids are NOT DETECTED. The SARS-CoV-2 RNA is generally detectable in upper and lower respiratory specimens during the acute phase of infection. Negative results do not preclude SARS-CoV-2 infection, do not rule out co-infections with other pathogens, and should not be used as the sole basis for treatment or other patient management decisions. Negative results must be combined with clinical observations, patient history, and epidemiological information. The expected result is Negative. Fact Sheet for Patients: SugarRoll.be Fact Sheet for Healthcare  Providers: https://www.woods-mathews.com/ This test is not yet approved or cleared by the [place] FDA and  has been authorized for detection and/or diagnosis of SARS-CoV-2 by FDA under an Emergency Use Authorization (EUA). This EUA will remain  in effect (meaning this test can be used) for the duration of the COVID-19 declaration under Section 56 4(b)(1) of the Act, 21 U.S.C. section 360bbb-3(b)(1), unless the authorization is terminated or revoked sooner. Performed at [facility], [place] [address], [place], [place] [address]   Blood culture (routine x 2)     Status: None (Preliminary result)   Collection Time: 07/25/19  1:39 PM   Specimen: BLOOD RIGHT ARM  Result Value Ref Range Status   Specimen Description   Final    BLOOD RIGHT ARM Performed at [facility], [address] [place], [place] [address]    Special Requests   Final    BOTTLES DRAWN AEROBIC AND ANAEROBIC Blood Culture adequate volume Performed at [facility], [address] [place], [place] [address]    Culture   Final    NO GROWTH 2 DAYS Performed at [facility], [place] [address] [place], [place] [address]    Report Status PENDING  Incomplete     Radiology Studies: CT SOFT TISSUE NECK W CONTRAST  Result Date: 07/26/2019 CLINICAL DATA:  Neck pain and fever with enlarged neck node. Right-sided sinus pressure and pain. EXAM: CT NECK WITH CONTRAST TECHNIQUE: Multidetector CT imaging of the neck was performed using the standard protocol following the bolus administration of intravenous contrast. CONTRAST:  7mL OMNIPAQUE IOHEXOL 300 MG/ML  SOLN COMPARISON:  None. FINDINGS: Pharynx and larynx: No evidence of mass or abnormal enhancement. Bilateral palatine tonsilliths. Salivary glands: No inflammation, mass, or stone. Thyroid: Normal. Lymph nodes: Symmetric prominence of cervical lymph nodes with no worrisome enlargement or heterogeneity. Vascular: Unremarkable. Limited  intracranial: Negative Visualized orbits: Negative Mastoids and visualized paranasal sinuses: Chronic left maxillary and ethmoid sinusitis as described on maxillofacial CT performed at the same time. Skeleton: No acute or erosive finding. There is C5-6 disc narrowing with mild ridging. Torus mandibularis. Upper chest: Clear apical lungs. IMPRESSION: 1. Negative for lymphadenopathy. 2. Chronic left maxillary and ethmoid sinusitis. Electronically Signed   By: Monte Fantasia M.D.   On: 07/26/2019 10:39   CT MAXILLOFACIAL W CONTRAST  Result Date: 07/26/2019 CLINICAL DATA:  Fever and right ear pain EXAM: CT MAXILLOFACIAL WITH CONTRAST TECHNIQUE: Multidetector CT imaging of the maxillofacial structures was performed with intravenous contrast. Multiplanar CT image reconstructions were also generated. CONTRAST:  13mL OMNIPAQUE IOHEXOL 300 MG/ML  SOLN COMPARISON:  None. FINDINGS: Osseous: No evidence of fracture or erosion. No evidence  of odontogenic infection. Orbits: Negative.  No evidence of mass or inflammation Sinuses: Chronic left maxillary sinusitis with sclerotic wall thickening and near complete opacification. The ostium and infundibulum patent. Milder opacification of left ethmoid air cells. The nasal septum is deviated and there is congested appearing left-sided nasal mucosa, likely nasal cycle. Clear mastoids and middle ears. Soft tissues: No evidence of inflammation. Specifically no evidence of parotiditis or otitis externa. Incidental tonsilliths. Limited intracranial: Negative IMPRESSION: 1. No explanation for right-sided pain. No mastoid or middle ear opacification. 2. Chronic left-sided sinusitis. Electronically Signed   By: Monte Fantasia M.D.   On: 07/26/2019 10:35    Scheduled Meds: Continuous Infusions: . sodium chloride 125 mL/hr at 07/27/19 0913  . ceFEPime (MAXIPIME) IV 2 g (07/27/19 1521)  . metronidazole 500 mg (07/27/19 0505)     LOS: 2 days   Marylu Lund, MD [facility] Pager On Amion  If 7PM-7AM, please contact night-coverage 07/27/2019, 3:40 PM

## 2019-07-28 ENCOUNTER — Telehealth: Payer: Self-pay

## 2019-07-28 LAB — COMPREHENSIVE METABOLIC PANEL
ALT: 27 U/L (ref 0–44)
AST: 21 U/L (ref 15–41)
Albumin: 3.3 g/dL — ABNORMAL LOW (ref 3.5–5.0)
Alkaline Phosphatase: 57 U/L (ref 38–126)
Anion gap: 9 (ref 5–15)
BUN: 7 mg/dL (ref 6–20)
CO2: 25 mmol/L (ref 22–32)
Calcium: 8.7 mg/dL — ABNORMAL LOW (ref 8.9–10.3)
Chloride: 107 mmol/L (ref 98–111)
Creatinine, Ser: 0.61 mg/dL (ref 0.44–1.00)
GFR calc Af Amer: >60 mL/min (ref >60)
GFR calc non Af Amer: >60 mL/min (ref >60)
Glucose, Bld: 109 mg/dL — ABNORMAL HIGH (ref 70–99)
Potassium: 3.3 mmol/L — ABNORMAL LOW (ref 3.5–5.1)
Sodium: 141 mmol/L (ref 135–145)
Total Bilirubin: 0.7 mg/dL (ref 0.3–1.2)
Total Protein: 6.4 g/dL — ABNORMAL LOW (ref 6.5–8.1)

## 2019-07-28 LAB — CBC
HCT: 41.4 % (ref 36.0–46.0)
Hemoglobin: 13.4 g/dL (ref 12.0–15.0)
MCH: 30.3 pg (ref 26.0–34.0)
MCHC: 32.4 g/dL (ref 30.0–36.0)
MCV: 93.7 fL (ref 80.0–100.0)
Platelets: 305 10*3/uL (ref 150–400)
RBC: 4.42 MIL/uL (ref 3.87–5.11)
RDW: 13.2 % (ref 11.5–15.5)
WBC: 7 10*3/uL (ref 4.0–10.5)
nRBC: 0 % (ref 0.0–0.2)

## 2019-07-28 MED ORDER — POTASSIUM CHLORIDE CRYS ER 20 MEQ PO TBCR
40.0000 meq | EXTENDED_RELEASE_TABLET | Freq: Once | ORAL | Status: AC
  Administered 2019-07-28: 09:00:00 40 meq via ORAL
  Filled 2019-07-28: qty 2

## 2019-07-28 MED ORDER — METRONIDAZOLE 500 MG PO TABS: 500.0000 mg | ORAL_TABLET | Freq: Three times a day (TID) | ORAL | 0 refills | Status: AC

## 2019-07-28 MED ORDER — CEFDINIR 300 MG PO CAPS: 300.0000 mg | ORAL_CAPSULE | Freq: Two times a day (BID) | ORAL | 0 refills | Status: AC

## 2019-07-28 NOTE — Telephone Encounter (Signed)
-----   Message from Ladene Artist, MD sent at 07/27/2019  9:20 AM EST ----- Regarding: FW: hospital follow up Please schedule REV ----- Message ----- From: Doran Stabler, MD Sent: 07/27/2019   9:17 AM EST To: Ladene Artist, MD Subject: hospital follow up                             [name],    This patient saw you for colonoscopy in 2013 She should be heading home 2/11 from [facility] for diverticulitis. Needs OV with you  - HD

## 2019-07-28 NOTE — Telephone Encounter (Signed)
Left a message for patient to return my call. 

## 2019-07-28 NOTE — Discharge Summary (Signed)
Physician Discharge Summary  Christine Rodgers [id number] DOB: 06-02-1961 DOA: 07/25/2019  PCP: Debbrah Alar, NP  Admit date: 07/25/2019 Discharge date: 07/28/2019  Admitted From: Home Disposition:  Home  Recommendations for Outpatient Follow-up:  1. Follow up with PCP in 1-2 weeks 2. Follow up with GI as scheduled  Discharge Condition:Improved CODE STATUS:Full Diet recommendation: Soft, advance as tolerated   Brief/Interim Summary: 59 y.o.femalewith medical history significant ofanemia, arthritis, asthma, fibromyalgia, hyperlipidemia, and conditions listed below presentedto the ED for evaluation of feverand abdominal pain. Patient states for the past 2 days she has had sharp left lower quadrant abdominal pain. No associated emesis or diarrhea. She is having regular nonbloody bowel movements. Yesterday she had a fever of 102.7 F. States about 4 days ago she was seen at urgent care as she was having swelling of alymph node in her neck. In addition, she was also having right-sided sinus pain/pressure and right ear pressure. Also having rhinorrhea with clear nasal secretions. No sore throat. Her URI symptoms started about a week ago. States was prescribed prednisone which she has not started yet.  ED Course:HEENTexam done in the ED unremarkable other than tenderness over the left anterior cervical lymph node. Febrile with T-max 102 F. Slightly tachycardic. Not hypotensive. Not hypoxic. Labs showing WBC count 14.8. Lactic acid normal. No significant elevation of LFTs. Lipase normal. UA not suggestive of infection. Blood culture x2 pending. SARS-CoV-2 PCR test negative. CT abdomen pelvis showing acute left lower quadrant descending colon diverticulitis with adjacent small microperforation and moderate inflammation/edema in the left pericolic gutter and retroperitoneal space. No drainable fluid collection or abscess.  Patient received Tylenol, morphine,  cefepime, metronidazole, and 2 L normal saline boluses.  Discharge Diagnoses:  Principal Problem:   Acute diverticulitis Active Problems:   Sepsis ([place])   Ear pain   Cervical lymphadenopathy   QT prolongation  1 sepsis secondary to acute diverticulitis with small microperforation, POA Patient presenting with a 2-day history of fevers, left lower quadrant abdominal pain, temperature of 102 in the ED, tachycardic, leukocytosis meeting criteria for sepsis. CT abdomen and pelvis consistent with a left lower quadrant descending colonic diverticulitis with adjacent small microperforation and moderate inflammation/edema in the left pericolic gutter and retroperitoneal space with no drainable fluid collection or abscess. Patient improving clinically. Leukocytosis trending down.  Was continued on empiric IV cefepime and IV Flagyl.  GI consulted and appreciate input and recommendations. Per GI d/c on flagyl and omnicef today  2. QTcprolongation Repeat EKG with resolution of QTC prolongation.  Remained stable  3. Right-sided ear/sinus pressure/cervical lymphadenopathy Tympanic membrane is noted to be normal per ED PA. CT soft tissue neck negative for lymphadenopathy, chronic left maxillary and ethmoid sinusitis. CT maxillofacial with no explanation for right-sided pain, no mastoid or middle ear opacification. Chronic left sided sinusitis.  Recommend outpatient follow-up.  4. Asthma Remains stable. Albuterol inhaler as needed.   Discharge Instructions   Allergies as of 07/28/2019      Reactions   Aspirin    REACTION: severe nausea   Codeine    REACTION: severe nausea   Doxycycline Hyclate    REACTION: hives, throat swelling   Erythromycin    Rash, gi side effects.     Penicillins    REACTION: passed out   Sulfonamide Derivatives    REACTION: diarrhea, vomitting   Levaquin [levofloxacin In D5w] Rash      Medication List    STOP taking these medications    benzonatate 100 MG capsule  Commonly known as: TESSALON   fluticasone 50 MCG/ACT nasal spray Commonly known as: FLONASE   meclizine 25 MG tablet Commonly known as: ANTIVERT   mometasone 50 MCG/ACT nasal spray Commonly known as: Nasonex   predniSONE 1 MG tablet Commonly known as: DELTASONE     TAKE these medications   albuterol 108 (90 Base) MCG/ACT inhaler Commonly known as: VENTOLIN HFA Inhale 2 puffs into the lungs every 6 (six) hours as needed for wheezing.   albuterol 108 (90 Base) MCG/ACT inhaler Commonly known as: VENTOLIN HFA Inhale 2 puffs into the lungs every 6 (six) hours as needed for wheezing or shortness of breath.   cefdinir 300 MG capsule Commonly known as: OMNICEF Take 1 capsule (300 mg total) by mouth 2 (two) times daily for 11 days.   cholecalciferol 25 MCG (1000 UNIT) tablet Commonly known as: VITAMIN D3 Take 1,000 Units by mouth daily.   co-enzyme Q-10 30 MG capsule Take 30 mg by mouth daily.   Elderberry 575 MG/5ML Syrp Take 5 mLs by mouth daily.   GRAPESEED EXTRACT PO Take 10 drops by mouth daily.   metroNIDAZOLE 500 MG tablet Commonly known as: Flagyl Take 1 tablet (500 mg total) by mouth 3 (three) times daily for 11 days.   psyllium 0.52 g capsule Commonly known as: REGULOID Take 0.104 g by mouth daily.   Turmeric 500 MG Tabs Take 250 tablets by mouth daily.      Follow-up Information    Debbrah Alar, NP. Schedule an appointment as soon as possible for a visit in 2 week(s).   Specialty: Internal Medicine Contact information: [place] [id number] [phone]        Ladene Artist, MD Follow up.   Specialty: Gastroenterology Why: as scheduled Contact information: [address] [address] [phone]          Allergies  Allergen Reactions  . Aspirin     REACTION: severe nausea  . Codeine     REACTION: severe nausea  . Doxycycline Hyclate     REACTION:  hives, throat swelling  . Erythromycin     Rash, gi side effects.    . Penicillins     REACTION: passed out  . Sulfonamide Derivatives     REACTION: diarrhea, vomitting  . Levaquin [Levofloxacin In D5w] Rash    Consultations:  GI  Procedures/Studies: CT SOFT TISSUE NECK W CONTRAST  Result Date: 07/26/2019 CLINICAL DATA:  Neck pain and fever with enlarged neck node. Right-sided sinus pressure and pain. EXAM: CT NECK WITH CONTRAST TECHNIQUE: Multidetector CT imaging of the neck was performed using the standard protocol following the bolus administration of intravenous contrast. CONTRAST:  49mL OMNIPAQUE IOHEXOL 300 MG/ML  SOLN COMPARISON:  None. FINDINGS: Pharynx and larynx: No evidence of mass or abnormal enhancement. Bilateral palatine tonsilliths. Salivary glands: No inflammation, mass, or stone. Thyroid: Normal. Lymph nodes: Symmetric prominence of cervical lymph nodes with no worrisome enlargement or heterogeneity. Vascular: Unremarkable. Limited intracranial: Negative Visualized orbits: Negative Mastoids and visualized paranasal sinuses: Chronic left maxillary and ethmoid sinusitis as described on maxillofacial CT performed at the same time. Skeleton: No acute or erosive finding. There is C5-6 disc narrowing with mild ridging. Torus mandibularis. Upper chest: Clear apical lungs. IMPRESSION: 1. Negative for lymphadenopathy. 2. Chronic left maxillary and ethmoid sinusitis. Electronically Signed   By: Monte Fantasia M.D.   On: 07/26/2019 10:39   CT Abdomen Pelvis W Contrast  Result Date: 07/25/2019  CLINICAL DATA:  Left lower quadrant pain with nausea and fever EXAM: CT ABDOMEN AND PELVIS WITH CONTRAST TECHNIQUE: Multidetector CT imaging of the abdomen and pelvis was performed using the standard protocol following bolus administration of intravenous contrast. CONTRAST:  156mL OMNIPAQUE IOHEXOL 300 MG/ML  SOLN COMPARISON:  None. FINDINGS: Lower chest: Scattered bibasilar subsegmental atelectasis  and or scarring. Normal heart size. No pericardial or pleural effusion. Small hiatal hernia noted. Degenerative changes thoracic spine with osteophytes on the right. Hepatobiliary: Diffuse hypoattenuation liver compatible with hepatic steatosis. Mild associated hepatomegaly measuring 20 cm in length. No focal hepatic abnormality or intrahepatic biliary dilatation. Hepatic and portal veins are patent. Gallbladder and biliary system unremarkable. Common bile duct nondilated. Pancreas: Unremarkable. No pancreatic ductal dilatation or surrounding inflammatory changes. Spleen: Normal in size without focal abnormality. Accessory splenule noted. Adrenals/Urinary Tract: Adrenal glands are unremarkable. Kidneys are normal, without renal calculi, focal lesion, or hydronephrosis. Bladder is unremarkable. Stomach/Bowel: Negative for bowel obstruction, significant dilatation, ileus, or free air. Normal appendix in the right lower quadrant. In the left lower quadrant, there is diverticular disease noted with scattered diverticula and left descending colon pericolonic strandy edema/inflammation along the left retroperitoneal space. Adjacent locules of extraluminal air noted in the left pericolonic fat, images 62. Findings compatible with localized acute diverticulitis and adjacent micro perforation. No associated significant drainable fluid collection or abscess. Vascular/Lymphatic: Aortoiliac minor atherosclerosis without occlusive process, aneurysm or acute vascular process. Mesenteric and renal vasculature appear patent. No veno-occlusive process. No bulky adenopathy. Reproductive: Uterus and adnexa normal in size. Nonspecific mild prominence of the ovarian veins bilaterally remains nonspecific. No pelvic free fluid or fluid collection. Other: No abdominal wall hernia or abnormality. No abdominopelvic ascites. Musculoskeletal: Degenerative changes of the spine with lower lumbar facet arthropathy. IMPRESSION: Acute left lower  quadrant descending colon diverticulitis with adjacent small micro perforation and moderate inflammation/edema in the left pericolic gutter and retroperitoneal space. No drainable fluid collection or abscess. Hepatic steatosis Aortic Atherosclerosis (ICD10-I70.0). Electronically Signed   By: Jerilynn Mages.  Shick M.D.   On: 07/25/2019 13:17   CT MAXILLOFACIAL W CONTRAST  Result Date: 07/26/2019 CLINICAL DATA:  Fever and right ear pain EXAM: CT MAXILLOFACIAL WITH CONTRAST TECHNIQUE: Multidetector CT imaging of the maxillofacial structures was performed with intravenous contrast. Multiplanar CT image reconstructions were also generated. CONTRAST:  57mL OMNIPAQUE IOHEXOL 300 MG/ML  SOLN COMPARISON:  None. FINDINGS: Osseous: No evidence of fracture or erosion. No evidence of odontogenic infection. Orbits: Negative.  No evidence of mass or inflammation Sinuses: Chronic left maxillary sinusitis with sclerotic wall thickening and near complete opacification. The ostium and infundibulum patent. Milder opacification of left ethmoid air cells. The nasal septum is deviated and there is congested appearing left-sided nasal mucosa, likely nasal cycle. Clear mastoids and middle ears. Soft tissues: No evidence of inflammation. Specifically no evidence of parotiditis or otitis externa. Incidental tonsilliths. Limited intracranial: Negative IMPRESSION: 1. No explanation for right-sided pain. No mastoid or middle ear opacification. 2. Chronic left-sided sinusitis. Electronically Signed   By: Monte Fantasia M.D.   On: 07/26/2019 10:35     Subjective: Eager to go home today  Discharge Exam: Vitals:   07/27/19 1429 07/28/19 0554  BP: 139/90 (!) 153/96  Pulse: 85 75  Resp: (!) 22 18  Temp: 98.3 F (36.8 C) 98.3 F (36.8 C)  SpO2: 98% 96%   Vitals:   07/26/19 2108 07/27/19 0435 07/27/19 1429 07/28/19 0554  BP: (!) 144/90 (!) 143/97 139/90 (!) 153/96  Pulse: 88  86 85 75  Resp: 18 18 (!) 22 18  Temp: 99.1 F (37.3 C) 98.4 F  (36.9 C) 98.3 F (36.8 C) 98.3 F (36.8 C)  TempSrc: Oral Oral Oral Oral  SpO2: 95% 100% 98% 96%  Weight:      Height:        General: Pt is alert, awake, not in acute distress Cardiovascular: RRR, S1/S2 +, no rubs, no gallops Respiratory: CTA bilaterally, no wheezing, no rhonchi Abdominal: Soft, NT, ND, bowel sounds + Extremities: no edema, no cyanosis   The results of significant diagnostics from this hospitalization (including imaging, microbiology, ancillary and laboratory) are listed below for reference.     Microbiology: Recent Results (from the past 240 hour(s))  SARS CORONAVIRUS 2 (TAT 6-24 HRS) Nasopharyngeal Nasopharyngeal Swab     Status: None   Collection Time: 07/25/19  1:31 PM   Specimen: Nasopharyngeal Swab  Result Value Ref Range Status   SARS Coronavirus 2 NEGATIVE NEGATIVE Final    Comment: (NOTE) SARS-CoV-2 target nucleic acids are NOT DETECTED. The SARS-CoV-2 RNA is generally detectable in upper and lower respiratory specimens during the acute phase of infection. Negative results do not preclude SARS-CoV-2 infection, do not rule out co-infections with other pathogens, and should not be used as the sole basis for treatment or other patient management decisions. Negative results must be combined with clinical observations, patient history, and epidemiological information. The expected result is Negative. Fact Sheet for Patients: SugarRoll.be Fact Sheet for Healthcare Providers: https://www.woods-mathews.com/ This test is not yet approved or cleared by the [place] FDA and  has been authorized for detection and/or diagnosis of SARS-CoV-2 by FDA under an Emergency Use Authorization (EUA). This EUA will remain  in effect (meaning this test can be used) for the duration of the COVID-19 declaration under Section 56 4(b)(1) of the Act, 21 U.S.C. section 360bbb-3(b)(1), unless the authorization is terminated  or revoked sooner. Performed at [facility], [place] [address], [place], [place] [address]   Blood culture (routine x 2)     Status: None (Preliminary result)   Collection Time: 07/25/19  1:39 PM   Specimen: BLOOD RIGHT ARM  Result Value Ref Range Status   Specimen Description   Final    BLOOD RIGHT ARM Performed at [facility], [address] [place], [place] [address]    Special Requests   Final    BOTTLES DRAWN AEROBIC AND ANAEROBIC Blood Culture adequate volume Performed at [facility], [address] [place], [place] [address]    Culture   Final    NO GROWTH 2 DAYS Performed at [facility], [place] [address] [place], [place] [address]    Report Status PENDING  Incomplete     Labs: BNP (last 3 results) No results for input(s): BNP in the last 8760 hours. Basic Metabolic Panel: Recent Labs  Lab 07/25/19 1130 07/26/19 0003 07/27/19 0422 07/28/19 0417  NA 136  --  141 141  K 3.5  --  3.7 3.3*  CL 103  --  108 107  CO2 24  --  25 25  GLUCOSE 105*  --  108* 109*  BUN 9  --  6 7  CREATININE 0.57  --  0.58 0.61  CALCIUM 9.0  --  8.6* 8.7*  MG  --  2.0 2.0  --    Liver Function Tests: Recent Labs  Lab 07/25/19 1130 07/28/19 0417  AST 30 21  ALT 47* 27  ALKPHOS 81 57  BILITOT 1.3* 0.7  PROT 7.6 6.4*  ALBUMIN 4.1 3.3*   Recent Labs  Lab 07/25/19 1130  LIPASE 27   No results for input(s): AMMONIA in the last 168 hours. CBC: Recent Labs  Lab 07/25/19 1130 07/26/19 0003 07/27/19 0422 07/28/19 0417  WBC 14.8* 12.9* 9.4 7.0  NEUTROABS 10.9*  --  5.9  --   HGB 15.3* 13.7 13.0 13.4  HCT 46.2* 42.6 40.1 41.4  MCV 91.5 93.4 93.5 93.7  PLT 313 298 272 305   Cardiac Enzymes: No results for input(s): CKTOTAL, CKMB, CKMBINDEX, TROPONINI in the last 168 hours. BNP: Invalid input(s): POCBNP CBG: No results for input(s): GLUCAP in the last 168 hours. D-Dimer No results for input(s): DDIMER in the last 72 hours. Hgb  A1c No results for input(s): HGBA1C in the last 72 hours. Lipid Profile No results for input(s): CHOL, HDL, LDLCALC, TRIG, CHOLHDL, LDLDIRECT in the last 72 hours. Thyroid function studies No results for input(s): TSH, T4TOTAL, T3FREE, THYROIDAB in the last 72 hours.  Invalid input(s): FREET3 Anemia work up No results for input(s): VITAMINB12, FOLATE, FERRITIN, TIBC, IRON, RETICCTPCT in the last 72 hours. Urinalysis    Component Value Date/Time   COLORURINE YELLOW 07/25/2019 1130   APPEARANCEUR CLEAR 07/25/2019 1130   LABSPEC 1.020 07/25/2019 1130   PHURINE 6.5 07/25/2019 1130   GLUCOSEU NEGATIVE 07/25/2019 1130   HGBUR SMALL (A) 07/25/2019 1130   BILIRUBINUR NEGATIVE 07/25/2019 1130   BILIRUBINUR neg 08/02/2015 0951   KETONESUR NEGATIVE 07/25/2019 1130   PROTEINUR NEGATIVE 07/25/2019 1130   UROBILINOGEN 1.0 08/02/2015 0951   UROBILINOGEN 0.2 01/12/2012 1016   NITRITE NEGATIVE 07/25/2019 1130   LEUKOCYTESUR NEGATIVE 07/25/2019 1130   Sepsis Labs Invalid input(s): PROCALCITONIN,  WBC,  LACTICIDVEN Microbiology Recent Results (from the past 240 hour(s))  SARS CORONAVIRUS 2 (TAT 6-24 HRS) Nasopharyngeal Nasopharyngeal Swab     Status: None   Collection Time: 07/25/19  1:31 PM   Specimen: Nasopharyngeal Swab  Result Value Ref Range Status   SARS Coronavirus 2 NEGATIVE NEGATIVE Final    Comment: (NOTE) SARS-CoV-2 target nucleic acids are NOT DETECTED. The SARS-CoV-2 RNA is generally detectable in upper and lower respiratory specimens during the acute phase of infection. Negative results do not preclude SARS-CoV-2 infection, do not rule out co-infections with other pathogens, and should not be used as the sole basis for treatment or other patient management decisions. Negative results must be combined with clinical observations, patient history, and epidemiological information. The expected result is Negative. Fact Sheet for  Patients: SugarRoll.be Fact Sheet for Healthcare Providers: https://www.woods-mathews.com/ This test is not yet approved or cleared by the [place] FDA and  has been authorized for detection and/or diagnosis of SARS-CoV-2 by FDA under an Emergency Use Authorization (EUA). This EUA will remain  in effect (meaning this test can be used) for the duration of the COVID-19 declaration under Section 56 4(b)(1) of the Act, 21 U.S.C. section 360bbb-3(b)(1), unless the authorization is terminated or revoked sooner. Performed at [facility], [place] [address] [place], [place] [address]   Blood culture (routine x 2)     Status: None (Preliminary result)   Collection Time: 07/25/19  1:39 PM   Specimen: BLOOD RIGHT ARM  Result Value Ref Range Status   Specimen Description   Final    BLOOD RIGHT ARM Performed at [facility], [address], [place], [place] [address]  Special Requests   Final    BOTTLES DRAWN AEROBIC AND ANAEROBIC Blood Culture adequate volume Performed at [facility], [address] [place], [place] [address]    Culture   Final    NO GROWTH 2 DAYS Performed at [facility], [place] [address], [place], [place] [address]    Report Status PENDING  Incomplete   Time spent: 30 min  SIGNED:   Marylu Lund, MD  [facility] 07/28/2019, 10:26 AM  If 7PM-7AM, please contact night-coverage

## 2019-07-28 NOTE — Progress Notes (Signed)
Patient being discharged via private vehicle in stable condition with husband. Discharge teaching complete.

## 2019-07-29 ENCOUNTER — Telehealth: Payer: Self-pay | Admitting: *Deleted

## 2019-07-29 NOTE — Telephone Encounter (Signed)
Left a message for patient to return my call. 

## 2019-07-29 NOTE — Telephone Encounter (Signed)
Transition Care Management Follow-up Telephone Call   Date discharged? 08/12/19   How have you been since you were released from the hospital? "Doing better"   Do you understand why you were in the hospital? yes   Do you understand the discharge instructions? yes   Where were you discharged to? Home. Son is w/her.   Items Reviewed:  Medications reviewed: yes  Allergies reviewed: yes  Dietary changes reviewed: yes  Referrals reviewed: yes   Functional Questionnaire:   Activities of Daily Living (ADLs):   She states they are independent in the following: ambulation, bathing and hygiene, feeding, continence, grooming, toileting and dressing States they require assistance with the following: na   Any transportation issues/concerns?: no   Any patient concerns? yes, pt states her BP was elevated in the hospital and still at home. Running 140-150s/ 90s. Pt is unsure if her home cuff is accurate. Pt instructed to seek medical care if BP cont to stay elevated. Parameters discussed. Pt verbalized understanding.    Confirmed importance and date/time of follow-up visits scheduled yes  Provider Appointment booked with PCP 08/12/19  Confirmed with patient if condition begins to worsen call PCP or go to the ER.  Patient was given the office number and encouraged to call back with question or concerns.  : yes

## 2019-07-29 NOTE — Telephone Encounter (Signed)
Patient scheduled appt for hospital follow up on 08/10/19.

## 2019-07-30 LAB — CULTURE, BLOOD (ROUTINE X 2)
Culture: NO GROWTH
Special Requests: ADEQUATE

## 2019-08-10 ENCOUNTER — Encounter: Payer: Self-pay | Admitting: Gastroenterology

## 2019-08-10 ENCOUNTER — Other Ambulatory Visit: Payer: Self-pay

## 2019-08-10 ENCOUNTER — Other Ambulatory Visit: Payer: 59

## 2019-08-10 ENCOUNTER — Ambulatory Visit: Payer: 59 | Admitting: Gastroenterology

## 2019-08-10 VITALS — BP 124/90 | HR 88 | Temp 97.6°F | Ht 67.0 in | Wt 247.0 lb

## 2019-08-10 DIAGNOSIS — R197 Diarrhea, unspecified: Secondary | ICD-10-CM | POA: Diagnosis not present

## 2019-08-10 DIAGNOSIS — K5792 Diverticulitis of intestine, part unspecified, without perforation or abscess without bleeding: Secondary | ICD-10-CM

## 2019-08-10 DIAGNOSIS — Z01818 Encounter for other preprocedural examination: Secondary | ICD-10-CM | POA: Diagnosis not present

## 2019-08-10 LAB — CBC WITH DIFFERENTIAL/PLATELET
Basophils Absolute: 0.1 10*3/uL (ref 0.0–0.1)
Basophils Relative: 1.3 % (ref 0.0–3.0)
Eosinophils Absolute: 0.3 10*3/uL (ref 0.0–0.7)
Eosinophils Relative: 3.3 % (ref 0.0–5.0)
HCT: 46.7 % — ABNORMAL HIGH (ref 36.0–46.0)
Hemoglobin: 15.6 g/dL — ABNORMAL HIGH (ref 12.0–15.0)
Lymphocytes Relative: 32.7 % (ref 12.0–46.0)
Lymphs Abs: 2.6 10*3/uL (ref 0.7–4.0)
MCHC: 33.5 g/dL (ref 30.0–36.0)
MCV: 89.8 fl (ref 78.0–100.0)
Monocytes Absolute: 0.7 10*3/uL (ref 0.1–1.0)
Monocytes Relative: 8.3 % (ref 3.0–12.0)
Neutro Abs: 4.3 10*3/uL (ref 1.4–7.7)
Neutrophils Relative %: 54.4 % (ref 43.0–77.0)
Platelets: 406 10*3/uL — ABNORMAL HIGH (ref 150.0–400.0)
RBC: 5.2 Mil/uL — ABNORMAL HIGH (ref 3.87–5.11)
RDW: 14 % (ref 11.5–15.5)
WBC: 7.9 10*3/uL (ref 4.0–10.5)

## 2019-08-10 LAB — BASIC METABOLIC PANEL
BUN: 13 mg/dL (ref 6–23)
CO2: 29 mEq/L (ref 19–32)
Calcium: 9.8 mg/dL (ref 8.4–10.5)
Chloride: 103 mEq/L (ref 96–112)
Creatinine, Ser: 0.67 mg/dL (ref 0.40–1.20)
GFR: 90.08 mL/min (ref >60.00)
Glucose, Bld: 83 mg/dL (ref 70–99)
Potassium: 4.9 mEq/L (ref 3.5–5.1)
Sodium: 139 mEq/L (ref 135–145)

## 2019-08-10 MED ORDER — NA SULFATE-K SULFATE-MG SULF 17.5-3.13-1.6 GM/177ML PO SOLN: 1.0000 | Freq: Once | ORAL | 0 refills | Status: AC

## 2019-08-10 NOTE — Patient Instructions (Addendum)
If you are age 59 or older, your body mass index should be between 23-30. Your Body mass index is 38.69 kg/m. If this is out of the aforementioned range listed, please consider follow up with your Primary Care Provider.  If you are age 93 or younger, your body mass index should be between 19-25. Your Body mass index is 38.69 kg/m. If this is out of the aformentioned range listed, please consider follow up with your Primary Care Provider.   You have been scheduled for a colonoscopy. Please follow written instructions given to you at your visit today.  Please pick up your prep supplies at the pharmacy within the next 1-3 days. If you use inhalers (even only as needed), please bring them with you on the day of your procedure.  Your provider has requested that you go to the basement level for lab work before leaving today. Press "B" on the elevator. The lab is located at the first door on the left as you exit the elevator.

## 2019-08-10 NOTE — Progress Notes (Signed)
    History of Present Illness: This is a 59 year old female returning for follow-up following hospitalization for diverticulitis with a contained microperforation.  She was hospitalized from February 8 to February 11.  During hospitalization she was treated with IV cefepime and IV Flagyl.  She was prescribed Flagyl and Omnicef at discharge.  She relates substantial resolution of her left lower quadrant pain.  She notes occasional mild twinges of pain particularly when seated for periods of time and this resolves with movement or recumbency.  The symptoms are steadily improving.  She relates significant difficulties while taking the antibiotics including but rash on her trunk and extremities and diarrhea.  Has resolved since discontinuing antibiotics however she states her stools are slightly softer than normal.  She remains on a restricted soft diet.  Colonoscopy in August 2013 revealed mild left colon diverticulosis as well as internal and external hemorrhoids.  Current Medications, Allergies, Past Medical History, Past Surgical History, Family History and Social History were reviewed in [employer] record.   Physical Exam: General: Well developed, well nourished, no acute distress Head: Normocephalic and atraumatic Eyes:  sclerae anicteric, EOMI Ears: Normal auditory acuity Mouth: Not examined, mask on during Covid-19 pandemic Lungs: Clear throughout to auscultation Heart: Regular rate and rhythm; no murmurs, rubs or bruits Abdomen: Soft, minimal left lower quadrant tenderness to deep palpation and non distended. No masses, hepatosplenomegaly or hernias noted. Normal Bowel sounds Rectal: Deferred to colonoscopy Musculoskeletal: Symmetrical with no gross deformities  Pulses:  Normal pulses noted Extremities: No clubbing, cyanosis, edema or deformities noted Neurological: Alert oriented x 4, grossly nonfocal Psychological:  Alert and cooperative. Normal mood and  affect   Assessment and Recommendations:  1. Descending colon diverticulitis with contained microperforation.  Recovering from recent hospitalization.  She has completed course of antibiotics.  Diarrhea likely antibiotic side effect as it is now resolving.  As she developed a rash on her recent outpatient antibiotics, Flagyl and Omnicef, both were added to her allergy list.  Liberalize diet.  No restrictions on activity or exercise.  Obtain CBC and BMET today.  Schedule colonoscopy. The risks (including bleeding, perforation, infection, missed lesions, medication reactions and possible hospitalization or surgery if complications occur), benefits, and alternatives to colonoscopy with possible biopsy and possible polypectomy were discussed with the patient and they consent to proceed.

## 2019-08-12 ENCOUNTER — Other Ambulatory Visit: Payer: Self-pay

## 2019-08-12 ENCOUNTER — Ambulatory Visit (INDEPENDENT_AMBULATORY_CARE_PROVIDER_SITE_OTHER): Payer: 59 | Admitting: Family

## 2019-08-12 ENCOUNTER — Encounter: Payer: Self-pay | Admitting: Family

## 2019-08-12 VITALS — BP 125/85 | HR 76 | Temp 98.5°F

## 2019-08-12 DIAGNOSIS — R9431 Abnormal electrocardiogram [ECG] [EKG]: Secondary | ICD-10-CM | POA: Diagnosis not present

## 2019-08-12 DIAGNOSIS — K5720 Diverticulitis of large intestine with perforation and abscess without bleeding: Secondary | ICD-10-CM

## 2019-08-12 DIAGNOSIS — J320 Chronic maxillary sinusitis: Secondary | ICD-10-CM

## 2019-08-12 NOTE — Progress Notes (Signed)
Virtual Visit via Video Note  I connected with Christine Rodgers on 08/12/19 at  7:40 AM EST by a video enabled telemedicine application and verified that I am speaking with the correct person using two identifiers.  Location: Patient: home Provider: office   I discussed the limitations of evaluation and management by telemedicine and the availability of in person appointments. The patient expressed understanding and agreed to proceed.  History of Present Illness:  Patient is a 59 yr old female who presents today for hospital follow up. She presented to the ED with fever and abdominal pain on 07/25/19.  Covid testing was negative. She was found to have Acute diverticulitis with adjacent small microperforation/Sepsis.  She was treated with IV abx.   She was ultimately discharged home on oral flagyl.  She completed abx. Reports that she developed skin blisters on the metronidazole.  She reports that she has some left sided abdominal pain which she states GI feels is because she is "still healing."  She is tolerating PO's but notes that she had diarrhea last night.  Denies fever or blood in the stool.   Hx of QT prolongation- noted while hospitalized.  She reports occasional palpitations.   Right ear pain/sinus pain- noted to have negative CT soft tissue of neck and normal CT maxillofacial.  Sinus CT noted chronic left sided sinusitis. Patient reports that these symptoms have improved.    Past Medical History:  Diagnosis Date  . Allergy   . Anemia    occasional low hbg  . Arthritis    knees  . Asthma   . Dysrhythmia   . Eczema   . Family history of anesthesia complication    father   . Fibromyalgia   . History of chicken pox   . Hyperlipidemia   . Pneumonia    3-4 years ago     Social History   Socioeconomic History  . Marital status: Married    Spouse name: Not on file  . Number of children: 3  . Years of education: Not on file  . Highest education level: Not on file   Occupational History  . Occupation: EXECUTIVE ADMIN [employer]    Employer: UNEMPLOYED  Tobacco Use  . Smoking status: Former Smoker    Packs/day: 0.50    Years: 2.00    Pack years: 1.00    Types: Cigarettes    Quit date: 01/18/2004    Years since quitting: 15.5  . Smokeless tobacco: Never Used  Substance and Sexual Activity  . Alcohol use: Yes    Alcohol/week: 7.0 standard drinks    Types: 7 Glasses of wine per week    Comment: occ  . Drug use: No  . Sexual activity: Yes  Other Topics Concern  . Not on file  Social History Narrative   Regular exercise:  No   Caffeine use:  2 mugs of coffee daily               Social Determinants of Health   Financial Resource Strain:   . Difficulty of Paying Living Expenses: Not on file  Food Insecurity:   . Worried About Charity fundraiser in the Last Year: Not on file  . Ran Out of Food in the Last Year: Not on file  Transportation Needs:   . Lack of Transportation (Medical): Not on file  . Lack of Transportation (Non-Medical): Not on file  Physical Activity:   . Days of Exercise per Week: Not on file  .  Minutes of Exercise per Session: Not on file  Stress:   . Feeling of Stress : Not on file  Social Connections:   . Frequency of Communication with Friends and Family: Not on file  . Frequency of Social Gatherings with Friends and Family: Not on file  . Attends Religious Services: Not on file  . Active Member of Clubs or Organizations: Not on file  . Attends Archivist Meetings: Not on file  . Marital Status: Not on file  Intimate Partner Violence:   . Fear of Current or Ex-Partner: Not on file  . Emotionally Abused: Not on file  . Physically Abused: Not on file  . Sexually Abused: Not on file    Past Surgical History:  Procedure Laterality Date  . BUNIONECTOMY  2008   right foot  . CERVICAL BIOPSY     benign  . KNEE ARTHROSCOPY Left 01/23/2014   Procedure: ARTHROSCOPY KNEE;  Surgeon: Vickey Huger, MD;  Location:  [place];  Service: Orthopedics;  Laterality: Left;    Family History  Problem Relation Age of Onset  . Hyperlipidemia Mother   . Hyperlipidemia Father        Deceased 65- sepsis/?pneumonia  . Arrhythmia Father   . Diabetes Paternal Grandmother   . Stroke Paternal Grandmother   . Stroke Paternal Grandfather   . Ulcerative colitis Daughter   . Diabetes Maternal Grandmother   . Diabetes Maternal Grandfather   . Colon cancer Neg Hx   . Stomach cancer Neg Hx     Allergies  Allergen Reactions  . Cefdinir Hives and Itching    Patient reports itching, blisters and diarrhea after ingesting medication  . Aspirin     REACTION: severe nausea  . Codeine     REACTION: severe nausea  . Doxycycline Hyclate     REACTION: hives, throat swelling  . Erythromycin     Rash, gi side effects.    . Metronidazole Hives and Itching    Patient reports 30 minutes after taking itching , blisters and diarrhea occur  . Penicillins     REACTION: passed out  . Sulfonamide Derivatives     REACTION: diarrhea, vomitting  . Levaquin [Levofloxacin In D5w] Rash    Current Outpatient Medications on File Prior to Visit  Medication Sig Dispense Refill  . albuterol (PROVENTIL HFA;VENTOLIN HFA) 108 (90 Base) MCG/ACT inhaler Inhale 2 puffs into the lungs every 6 (six) hours as needed for wheezing or shortness of breath. 1 Inhaler 2  . cholecalciferol (VITAMIN D3) 25 MCG (1000 UNIT) tablet Take 1,000 Units by mouth daily.    Marland Kitchen co-enzyme Q-10 30 MG capsule Take 30 mg by mouth daily.    Kendall Flack 575 MG/5ML SYRP Take 5 mLs by mouth daily.    . Nutritional Supplements (GRAPESEED EXTRACT PO) Take 10 drops by mouth daily.    . psyllium (REGULOID) 0.52 g capsule Take 0.104 g by mouth daily.     . Turmeric 500 MG TABS Take 250 tablets by mouth daily.     No current facility-administered medications on file prior to visit.    BP 125/85 (BP Location: Left Arm, Patient Position: Sitting)   Pulse 76   Temp 98.5 F  (36.9 C) (Tympanic)   LMP 05/22/2013 Comment: perimenopausal  SpO2 98%    Observations/Objective:   Gen: Awake, alert, no acute distress Resp: Breathing is even and non-labored Psych: calm/pleasant demeanor Neuro: Alert and Oriented x 3, + facial symmetry, speech is clear.  Assessment and Plan:  Diverticulitis with perforation- clinically improved. Normal wbc 2 days ago, afebrile. Pt is advised to call if symptoms do not continue to improved.  Prolonged QT- incidental finding on EKG while hospitalized.  Will refer to cardiology. Avoid QT prolonging medications.   Chronic left maxillary sinusitis- clinically stable.  Monitor.    Follow Up Instructions:    I discussed the assessment and treatment plan with the patient. The patient was provided an opportunity to ask questions and all were answered. The patient agreed with the plan and demonstrated an understanding of the instructions.   The patient was advised to call back or seek an in-person evaluation if the symptoms worsen or if the condition fails to improve as anticipated.  Nance Pear, NP

## 2019-08-17 ENCOUNTER — Encounter: Payer: Self-pay | Admitting: Cardiology

## 2019-08-17 ENCOUNTER — Ambulatory Visit: Payer: 59 | Admitting: Cardiology

## 2019-08-17 ENCOUNTER — Other Ambulatory Visit: Payer: Self-pay

## 2019-08-17 ENCOUNTER — Ambulatory Visit (INDEPENDENT_AMBULATORY_CARE_PROVIDER_SITE_OTHER): Payer: 59

## 2019-08-17 VITALS — BP 124/80 | HR 109 | Ht 67.0 in | Wt 248.1 lb

## 2019-08-17 DIAGNOSIS — E669 Obesity, unspecified: Secondary | ICD-10-CM

## 2019-08-17 DIAGNOSIS — E781 Pure hyperglyceridemia: Secondary | ICD-10-CM

## 2019-08-17 DIAGNOSIS — R002 Palpitations: Secondary | ICD-10-CM

## 2019-08-17 DIAGNOSIS — R9431 Abnormal electrocardiogram [ECG] [EKG]: Secondary | ICD-10-CM

## 2019-08-17 DIAGNOSIS — E782 Mixed hyperlipidemia: Secondary | ICD-10-CM

## 2019-08-17 DIAGNOSIS — E7800 Pure hypercholesterolemia, unspecified: Secondary | ICD-10-CM

## 2019-08-17 NOTE — Patient Instructions (Addendum)
Your physician recommends that you continue on your current medications as directed. Please refer to the Current Medication list given to you today.  *If you need a refill on your cardiac medications before your next appointment, please call your pharmacy*   Lab Work: Your physician recommends that you return for lab work in: TODAY TSH  If you have labs (blood work) drawn today and your tests are completely normal, you will receive your results only by: Marland Kitchen MyChart Message (if you have MyChart) OR . A paper copy in the mail If you have any lab test that is abnormal or we need to change your treatment, we will call you to review the results.   Testing/Procedures: A zio monitor was ordered today. It will remain on for 14 days. You will then return monitor and event diary in provided box. It takes 1-2 weeks for report to be downloaded and returned to [place]. We will call you with the results. If monitor falls off or has orange flashing light, please call Zio for further instructions.   Your physician has requested that you have an echocardiogram. Echocardiography is a painless test that uses sound waves to create images of your heart. It provides your doctor with information about the size and shape of your heart and how well your heart's chambers and valves are working. This procedure takes approximately one hour. There are no restrictions for this procedure.     Follow-Up: At [facility], you and your health needs are our priority.  As part of our continuing mission to provide you with exceptional heart care, we have created designated Provider Care Teams.  These Care Teams include your primary Cardiologist (physician) and Advanced Practice Providers (APPs -  Physician Assistants and Nurse Practitioners) who all work together to provide you with the care you need, when you need it.  We recommend signing up for the patient portal called "MyChart".  Sign up information is provided on this After  Visit Summary.  MyChart is used to connect with patients for Virtual Visits (Telemedicine).  Patients are able to view lab/test results, encounter notes, upcoming appointments, etc.  Non-urgent messages can be sent to your provider as well.   To learn more about what you can do with MyChart, go to [email or web address].    Your next appointment:   3 month(s)  The format for your next appointment:   In Person  Provider:   Berniece Salines, DO   Other Instructions

## 2019-08-17 NOTE — Progress Notes (Signed)
Cardiology Office Note:    Date:  08/17/2019   ID:  Christine Rodgers, DOB 1961-02-18, MRN [id number]  PCP:  Debbrah Alar, NP  Cardiologist:  Berniece Salines, DO  Electrophysiologist:  None    Referring MD: Debbrah Alar, NP   Patient was referred by her primary care provider.  History of Present Illness:    Christine Rodgers is a 59 y.o. female with a hx of anemia, arthritis, asthma, hyperlipidemia was referred by her primary care provider for palpitations.  Patient was recently hospitalized for acute diverticulitis.  During her office visit she was noted to have prolonged QT which resolved prior to her discharge.  Tells me that even before her hospitalization she has had had intermittent palpitations which she described an abrupt onset of fast heartbeat.  She notes that she has been experiencing even more since her hospitalization tells me that she feels a fast fluttering sensation which lasts for a few seconds prior to resolution.  Is associated with chest tightness.  No other complaints at this time.   Past Medical History:  Diagnosis Date  . Allergy   . Anemia    occasional low hbg  . Arthritis    knees  . Asthma   . Dysrhythmia   . Eczema   . Family history of anesthesia complication    father   . Fibromyalgia   . History of chicken pox   . Hyperlipidemia   . Pneumonia    3-4 years ago    Past Surgical History:  Procedure Laterality Date  . BUNIONECTOMY  2008   right foot  . CERVICAL BIOPSY     benign  . KNEE ARTHROSCOPY Left 01/23/2014   Procedure: ARTHROSCOPY KNEE;  Surgeon: Vickey Huger, MD;  Location: [place];  Service: Orthopedics;  Laterality: Left;    Current Medications: Current Meds  Medication Sig  . albuterol (PROVENTIL HFA;VENTOLIN HFA) 108 (90 Base) MCG/ACT inhaler Inhale 2 puffs into the lungs every 6 (six) hours as needed for wheezing or shortness of breath.  . cholecalciferol (VITAMIN D3) 25 MCG (1000 UNIT) tablet Take 1,000 Units by mouth  daily.  Marland Kitchen co-enzyme Q-10 30 MG capsule Take 30 mg by mouth daily.  Kendall Flack 575 MG/5ML SYRP Take 5 mLs by mouth daily.  . Nutritional Supplements (GRAPESEED EXTRACT PO) Take 10 drops by mouth daily.  . psyllium (REGULOID) 0.52 g capsule Take 0.104 g by mouth daily.   . Turmeric 500 MG TABS Take 250 tablets by mouth daily.     Allergies:   Cefdinir, Aspirin, Codeine, Doxycycline hyclate, Erythromycin, Metronidazole, Penicillins, Sulfonamide derivatives, and Levaquin [levofloxacin in d5w]   Social History   Socioeconomic History  . Marital status: Married    Spouse name: Not on file  . Number of children: 3  . Years of education: Not on file  . Highest education level: Not on file  Occupational History  . Occupation: EXECUTIVE ADMIN [employer]    Employer: UNEMPLOYED  Tobacco Use  . Smoking status: Former Smoker    Packs/day: 0.50    Years: 2.00    Pack years: 1.00    Types: Cigarettes    Quit date: 01/18/2004    Years since quitting: 15.5  . Smokeless tobacco: Never Used  Substance and Sexual Activity  . Alcohol use: Yes    Alcohol/week: 7.0 standard drinks    Types: 7 Glasses of wine per week    Comment: occ  . Drug use: No  . Sexual activity: Yes  Other Topics Concern  . Not on file  Social History Narrative   Regular exercise:  No   Caffeine use:  2 mugs of coffee daily               Social Determinants of Health   Financial Resource Strain:   . Difficulty of Paying Living Expenses: Not on file  Food Insecurity:   . Worried About Charity fundraiser in the Last Year: Not on file  . Ran Out of Food in the Last Year: Not on file  Transportation Needs:   . Lack of Transportation (Medical): Not on file  . Lack of Transportation (Non-Medical): Not on file  Physical Activity:   . Days of Exercise per Week: Not on file  . Minutes of Exercise per Session: Not on file  Stress:   . Feeling of Stress : Not on file  Social Connections:   . Frequency of Communication  with Friends and Family: Not on file  . Frequency of Social Gatherings with Friends and Family: Not on file  . Attends Religious Services: Not on file  . Active Member of Clubs or Organizations: Not on file  . Attends Archivist Meetings: Not on file  . Marital Status: Not on file     Family History: The patient's family history includes Arrhythmia in her father; Diabetes in her maternal grandfather, maternal grandmother, and paternal grandmother; Hyperlipidemia in her father and mother; Stroke in her paternal grandfather and paternal grandmother; Ulcerative colitis in her daughter. There is no history of Colon cancer or Stomach cancer.  ROS:   Review of Systems  Constitution: Negative for decreased appetite, fever and weight gain.  HENT: Negative for congestion, ear discharge, hoarse voice and sore throat.   Eyes: Negative for discharge, redness, vision loss in right eye and visual halos.  Cardiovascular: Report palpitations.  Negative for chest pain, dyspnea on exertion, leg swelling, orthopnea.  Respiratory: Negative for cough, hemoptysis, shortness of breath and snoring.   Endocrine: Negative for heat intolerance and polyphagia.  Hematologic/Lymphatic: Negative for bleeding problem. Does not bruise/bleed easily.  Skin: Negative for flushing, nail changes, rash and suspicious lesions.  Musculoskeletal: Negative for arthritis, joint pain, muscle cramps, myalgias, neck pain and stiffness.  Gastrointestinal: Negative for abdominal pain, bowel incontinence, diarrhea and excessive appetite.  Genitourinary: Negative for decreased libido, genital sores and incomplete emptying.  Neurological: Negative for brief paralysis, focal weakness, headaches and loss of balance.  Psychiatric/Behavioral: Negative for altered mental status, depression and suicidal ideas.  Allergic/Immunologic: Negative for HIV exposure and persistent infections.    EKGs/Labs/Other Studies Reviewed:    The  following studies were reviewed today:   EKG:  The ekg ordered today demonstrates sinus rhythm, heart rate 98 bpm, possible left atrial enlargement compared to EKG on 07/25/2019 reports sinus tachycardia with prolonged QT.  Recent Labs: 07/27/2019: Magnesium 2.0 07/28/2019: ALT 27 08/10/2019: BUN 13; Creatinine, Ser 0.67; Hemoglobin 15.6; Platelets 406.0; Potassium 4.9; Sodium 139  Recent Lipid Panel    Component Value Date/Time   CHOL 326 (H) 01/12/2012 1016   TRIG 176 (H) 01/12/2012 1016   HDL 58 01/12/2012 1016   CHOLHDL 5.6 01/12/2012 1016   VLDL 35 01/12/2012 1016   LDLCALC 233 (H) 01/12/2012 1016    Physical Exam:    VS:  BP 124/80   Pulse (!) 109   Ht 5\' 7"  (1.702 m)   Wt 248 lb 1.9 oz (112.5 kg)   LMP 05/22/2013 Comment: perimenopausal  SpO2 97%   BMI 38.86 kg/m     Wt Readings from Last 3 Encounters:  08/17/19 248 lb 1.9 oz (112.5 kg)  08/10/19 247 lb (112 kg)  07/25/19 245 lb (111.1 kg)     GEN: Well nourished, well developed in no acute distress HEENT: Normal NECK: No JVD; No carotid bruits LYMPHATICS: No lymphadenopathy CARDIAC: S1S2 noted,RRR, no murmurs, rubs, gallops RESPIRATORY:  Clear to auscultation without rales, wheezing or rhonchi  ABDOMEN: Soft, non-tender, non-distended, +bowel sounds, no guarding. EXTREMITIES: No edema, No cyanosis, no clubbing MUSCULOSKELETAL:  No deformity  SKIN: Warm and dry NEUROLOGIC:  Alert and oriented x 3, non-focal PSYCHIATRIC:  Normal affect, good insight  ASSESSMENT:    1. Palpitations   2. Mixed hyperlipidemia   3. Obesity (BMI 30-39.9)   4. QT prolongation    PLAN:     1.  Palpitations-I would like to rule out a cardiovascular etiology of this palpitation, therefore at this time I would like to placed a zio patch for 14 days. In additon a transthoracic echocardiogram will be ordered to assess LV/RV function and any structural abnormalities. Once these testing have been performed amd reviewed further  reccomendations will be made. For now, I do reccomend that the patient goes to the nearest ED if  symptoms recur.  2.  Hyperlipidemia-the patient prefers not to be on any medication she tells me that statins are not an option for her given her history of multiple allergies.  Did explain to her that her LDL is 233 which for her to be less than 100.  I also offered her the option for Zetia at this point she would like to research and we discussed at her next visit.  3.  Obesity-the patient understands the need to lose weight with diet and exercise. We have discussed specific strategies for this.  4.  QT prolongation-this has resolved please continue to monitor.  Avoid QTC prolonging medications.  The patient is in agreement with the above plan. The patient left the office in stable condition.  The patient will follow up in 3 months or sooner if needed   Medication Adjustments/Labs and Tests Ordered: Current medicines are reviewed at length with the patient today.  Concerns regarding medicines are outlined above.  Orders Placed This Encounter  Procedures  . TSH  . LONG TERM MONITOR (3-14 DAYS)  . EKG 12-Lead  . ECHOCARDIOGRAM COMPLETE   No orders of the defined types were placed in this encounter.   Patient Instructions  Your physician recommends that you continue on your current medications as directed. Please refer to the Current Medication list given to you today.  *If you need a refill on your cardiac medications before your next appointment, please call your pharmacy*   Lab Work: Your physician recommends that you return for lab work in: TODAY TSH  If you have labs (blood work) drawn today and your tests are completely normal, you will receive your results only by: Marland Kitchen MyChart Message (if you have MyChart) OR . A paper copy in the mail If you have any lab test that is abnormal or we need to change your treatment, we will call you to review the results.   Testing/Procedures: A  zio monitor was ordered today. It will remain on for 14 days. You will then return monitor and event diary in provided box. It takes 1-2 weeks for report to be downloaded and returned to [place]. We will call you with the results. If monitor falls  off or has orange flashing light, please call Zio for further instructions.   Your physician has requested that you have an echocardiogram. Echocardiography is a painless test that uses sound waves to create images of your heart. It provides your doctor with information about the size and shape of your heart and how well your heart's chambers and valves are working. This procedure takes approximately one hour. There are no restrictions for this procedure.     Follow-Up: At [facility], you and your health needs are our priority.  As part of our continuing mission to provide you with exceptional heart care, we have created designated Provider Care Teams.  These Care Teams include your primary Cardiologist (physician) and Advanced Practice Providers (APPs -  Physician Assistants and Nurse Practitioners) who all work together to provide you with the care you need, when you need it.  We recommend signing up for the patient portal called "MyChart".  Sign up information is provided on this After Visit Summary.  MyChart is used to connect with patients for Virtual Visits (Telemedicine).  Patients are able to view lab/test results, encounter notes, upcoming appointments, etc.  Non-urgent messages can be sent to your provider as well.   To learn more about what you can do with MyChart, go to [email or web address].    Your next appointment:   3 month(s)  The format for your next appointment:   In Person  Provider:   Berniece Salines, DO   Other Instructions      Adopting a Healthy Lifestyle.  Know what a healthy weight is for you (roughly BMI <25) and aim to maintain this   Aim for 7+ servings of fruits and vegetables daily   65-80+ fluid ounces of water  or unsweet tea for healthy kidneys   Limit to max 1 drink of alcohol per day; avoid smoking/tobacco   Limit animal fats in diet for cholesterol and heart health - choose grass fed whenever available   Avoid highly processed foods, and foods high in saturated/trans fats   Aim for low stress - take time to unwind and care for your mental health   Aim for 150 min of moderate intensity exercise weekly for heart health, and weights twice weekly for bone health   Aim for 7-9 hours of sleep daily   When it comes to diets, agreement about the perfect plan isnt easy to find, even among the experts. Experts at the [employer] developed an idea known as the Healthy Eating Plate. Just imagine a plate divided into logical, healthy portions.   The emphasis is on diet quality:   Load up on vegetables and fruits - one-half of your plate: Aim for color and variety, and remember that potatoes dont count.   Go for whole grains - one-quarter of your plate: Whole wheat, barley, wheat berries, quinoa, oats, brown rice, and foods made with them. If you want pasta, go with whole wheat pasta.   Protein power - one-quarter of your plate: Fish, chicken, beans, and nuts are all healthy, versatile protein sources. Limit red meat.   The diet, however, does go beyond the plate, offering a few other suggestions.   Use healthy plant oils, such as olive, canola, soy, corn, sunflower and peanut. Check the labels, and avoid partially hydrogenated oil, which have unhealthy trans fats.   If youre thirsty, drink water. Coffee and tea are good in moderation, but skip sugary drinks and limit milk and dairy products to one or  two daily servings.   The type of carbohydrate in the diet is more important than the amount. Some sources of carbohydrates, such as vegetables, fruits, whole grains, and beans-are healthier than others.   Finally, stay active  Signed, Berniece Salines, DO  08/17/2019 11:30 AM      [place]

## 2019-08-18 ENCOUNTER — Ambulatory Visit (HOSPITAL_BASED_OUTPATIENT_CLINIC_OR_DEPARTMENT_OTHER)
Admission: RE | Admit: 2019-08-18 | Discharge: 2019-08-18 | Disposition: A | Discharge status: Home / Self Care | Payer: 59 | Source: Ambulatory Visit | Attending: Cardiology | Admitting: Cardiology

## 2019-08-18 DIAGNOSIS — R002 Palpitations: Secondary | ICD-10-CM

## 2019-08-18 LAB — TSH: TSH: 1.17 u[IU]/mL (ref 0.450–4.500)

## 2019-08-19 ENCOUNTER — Ambulatory Visit: Payer: 59 | Attending: Internal Medicine

## 2019-08-19 DIAGNOSIS — Z23 Encounter for immunization: Secondary | ICD-10-CM

## 2019-08-19 NOTE — Progress Notes (Signed)
   Covid-19 Vaccination Clinic  Name:  Christine Rodgers    MRN: [id number] DOB: 25-Nov-1960  08/19/2019  Christine Rodgers was observed post Covid-19 immunization for 15 minutes without incident. She was provided with Vaccine Information Sheet and instruction to access the V-Safe system.   Christine Rodgers was instructed to call 911 with any severe reactions post vaccine: Marland Kitchen Difficulty breathing  . Swelling of face and throat  . A fast heartbeat  . A bad rash all over body  . Dizziness and weakness

## 2019-08-31 ENCOUNTER — Telehealth: Payer: Self-pay | Admitting: Gastroenterology

## 2019-08-31 MED ORDER — CEFDINIR 300 MG PO CAPS: 300.0000 mg | ORAL_CAPSULE | Freq: Two times a day (BID) | ORAL | 0 refills | Status: DC

## 2019-08-31 NOTE — Telephone Encounter (Signed)
Suspected recurrent diverticulitis. Limited oral antibiotic options given listed allergies. Please clarify, confirm all listed medication allergies with patient. Please discuss options with pharmacist. Then we can decide on a plan.

## 2019-08-31 NOTE — Telephone Encounter (Signed)
Patient reports GI symptoms with Omnicef.  Per Dr. Fuller Plan, at the recommendations of [facility] pharmacist, patient will need to add zyrtec 10 mg daily to Omnicef 300 mg BID for 7 days.  If patient reports a raised rash with zyrtec and Omnicef she will need to change to benadryl 25 mg with each dose.

## 2019-08-31 NOTE — Telephone Encounter (Signed)
Pt called stating she was returning your call

## 2019-08-31 NOTE — Telephone Encounter (Signed)
Patient with a recent history of acute diverticulitis and microperforation and recent admission. She reports that several days ago she developed stabbing lower abdominal pain.  Pain is constant and very similar to the pain she had while admitted.  She denies fever or constipation.  She reports that she is tolerating a diet, although she is performing intermittent fasting. She is having normal BMs.  She reports that the pain is worse when she is sitting and it does not hurt to ambulate.  No openings this week with you or APP.  Please advise next step.

## 2019-09-08 ENCOUNTER — Telehealth: Payer: Self-pay | Admitting: Gastroenterology

## 2019-09-08 DIAGNOSIS — K5792 Diverticulitis of intestine, part unspecified, without perforation or abscess without bleeding: Secondary | ICD-10-CM

## 2019-09-08 NOTE — Telephone Encounter (Signed)
CMP, CBC and CT AP to further evaluate for active diverticulitis, abscess

## 2019-09-08 NOTE — Telephone Encounter (Signed)
Patient has been scheduled for a CT scan at [facility] for 09/13/19 3:00.  She will come today and pick up her contrast and do her lab work.

## 2019-09-08 NOTE — Telephone Encounter (Signed)
Patient reports that she has finished the antibiotics last night.  She has some continued pressure.  No diarrhea or constipation.  Denies fever.  Pain has improved significantly, but not gone.  She reports that she has not had any problems with taking the [facility] and does not feel she is allergic to it as previously reported.  See previous phone notes about omnicef directions from 08/31/19.  Dr. Fuller Plan does she need to continue the omnicef rx?

## 2019-09-08 NOTE — Telephone Encounter (Signed)
Patient calling with concerns and questions is experiencing abd pain

## 2019-09-09 ENCOUNTER — Other Ambulatory Visit (INDEPENDENT_AMBULATORY_CARE_PROVIDER_SITE_OTHER): Payer: 59

## 2019-09-09 DIAGNOSIS — K5792 Diverticulitis of intestine, part unspecified, without perforation or abscess without bleeding: Secondary | ICD-10-CM | POA: Diagnosis not present

## 2019-09-09 LAB — COMPREHENSIVE METABOLIC PANEL
ALT: 58 U/L — ABNORMAL HIGH (ref 0–35)
AST: 34 U/L (ref 0–37)
Albumin: 4.6 g/dL (ref 3.5–5.2)
Alkaline Phosphatase: 88 U/L (ref 39–117)
BUN: 11 mg/dL (ref 6–23)
CO2: 27 mEq/L (ref 19–32)
Calcium: 9.5 mg/dL (ref 8.4–10.5)
Chloride: 101 mEq/L (ref 96–112)
Creatinine, Ser: 0.7 mg/dL (ref 0.40–1.20)
GFR: 85.62 mL/min (ref >60.00)
Glucose, Bld: 106 mg/dL — ABNORMAL HIGH (ref 70–99)
Potassium: 3.7 mEq/L (ref 3.5–5.1)
Sodium: 137 mEq/L (ref 135–145)
Total Bilirubin: 0.9 mg/dL (ref 0.2–1.2)
Total Protein: 7.6 g/dL (ref 6.0–8.3)

## 2019-09-09 LAB — CBC
HCT: 45.4 % (ref 36.0–46.0)
Hemoglobin: 15.4 g/dL — ABNORMAL HIGH (ref 12.0–15.0)
MCHC: 34 g/dL (ref 30.0–36.0)
MCV: 89.1 fl (ref 78.0–100.0)
Platelets: 314 10*3/uL (ref 150.0–400.0)
RBC: 5.1 Mil/uL (ref 3.87–5.11)
RDW: 13.7 % (ref 11.5–15.5)
WBC: 6.6 10*3/uL (ref 4.0–10.5)

## 2019-09-12 ENCOUNTER — Other Ambulatory Visit: Payer: Self-pay

## 2019-09-12 ENCOUNTER — Encounter: Payer: 59 | Admitting: Gastroenterology

## 2019-09-12 DIAGNOSIS — R7989 Other specified abnormal findings of blood chemistry: Secondary | ICD-10-CM

## 2019-09-12 NOTE — Progress Notes (Signed)
Hepatic  

## 2019-09-13 ENCOUNTER — Other Ambulatory Visit: Payer: Self-pay

## 2019-09-13 ENCOUNTER — Ambulatory Visit (INDEPENDENT_AMBULATORY_CARE_PROVIDER_SITE_OTHER)
Admission: RE | Admit: 2019-09-13 | Discharge: 2019-09-13 | Disposition: A | Discharge status: Home / Self Care | Payer: 59 | Source: Ambulatory Visit | Attending: Gastroenterology | Admitting: Gastroenterology

## 2019-09-13 DIAGNOSIS — K5792 Diverticulitis of intestine, part unspecified, without perforation or abscess without bleeding: Secondary | ICD-10-CM

## 2019-09-13 MED ORDER — IOHEXOL 300 MG/ML  SOLN
100.0000 mL | Freq: Once | INTRAMUSCULAR | Status: AC | PRN
  Administered 2019-09-13: 100 mL via INTRAVENOUS

## 2019-09-14 ENCOUNTER — Ambulatory Visit: Payer: 59 | Attending: Internal Medicine

## 2019-09-14 ENCOUNTER — Other Ambulatory Visit: Payer: Self-pay

## 2019-09-14 DIAGNOSIS — Z23 Encounter for immunization: Secondary | ICD-10-CM

## 2019-09-14 MED ORDER — CEFDINIR 300 MG PO CAPS: 300.0000 mg | ORAL_CAPSULE | Freq: Two times a day (BID) | ORAL | 0 refills | Status: DC

## 2019-09-14 NOTE — Progress Notes (Signed)
   Covid-19 Vaccination Clinic  Name:  Christine Rodgers    MRN: [id number] DOB: 05-05-1961  09/14/2019  [name] was observed post Covid-19 immunization for 15 minutes without incident. She was provided with Vaccine Information Sheet and instruction to access the V-Safe system.   [name] was instructed to call 911 with any severe reactions post vaccine: Marland Kitchen Difficulty breathing  . Swelling of face and throat  . A fast heartbeat  . A bad rash all over body  . Dizziness and weakness   Immunizations Administered    Name Date Dose VIS Date Route   Pfizer COVID-19 Vaccine 09/14/2019  3:28 PM 0.3 mL 05/27/2019 Intramuscular   Manufacturer: [place]   Lot: [id number]   [place]: [id number]

## 2019-09-15 ENCOUNTER — Telehealth: Payer: Self-pay

## 2019-09-15 MED ORDER — METOPROLOL SUCCINATE ER 25 MG PO TB24: 12.5000 mg | ORAL_TABLET | Freq: Every day | ORAL | 3 refills | Status: DC

## 2019-09-15 NOTE — Telephone Encounter (Signed)
Left detailed message for Pt.  Advised that short amounts of SVT found on her monitor and Dr. Harriet Masson would like her to start Toprol XL 12.5 mg one tablet daily.  Medication sent to Pt's pharmacy.  Will send to scheduling to set up 1 month f/u.

## 2019-09-15 NOTE — Telephone Encounter (Signed)
Spoke with the pt and informed her of her monitor results and recommendations per Dr. Harriet Masson.  Informed the pt that one of our triage nurses already sent her prescription into her pharmacy.  Pt education provided on how to take this medication.  Informed the pt that we already sent a message to our scheduling team to arrange for one month follow-up appt with Dr. Harriet Masson.  Informed the pt that she will get a call back in the near future to arrange this appt.  Pt verbalized understanding and agrees with this plan.

## 2019-09-15 NOTE — Telephone Encounter (Signed)
-----   Message from Berniece Salines, DO sent at 09/15/2019  2:06 PM EDT ----- Please let the patient know that her monitor show symptomatic SVT with rare symptomatic premature atrial complexes.  I like to start the patient on low-dose beta-blocker Toprol-XL 12.5 mg daily.  Please have her follow-up with me in 1 month.

## 2019-09-16 NOTE — Telephone Encounter (Signed)
Scheduled appt for 4/30 at 1:55 with Dr. Harriet Masson for this pt. She was agreeable to this time.

## 2019-09-23 ENCOUNTER — Telehealth: Payer: Self-pay | Admitting: Gastroenterology

## 2019-09-23 NOTE — Telephone Encounter (Signed)
Patient has completed the second round of antibiotics.  She reports she still has some abdominal pain. She is scheduled for a colonoscopy on 10/11/19.  Okay to proceed with colonoscopy, or do you want to cancel and set up office visit. She will remain on a soft diet for now.  She is aware that Dr. Fuller Plan will review next week when he returns.

## 2019-09-23 NOTE — Telephone Encounter (Signed)
Pt reported that she has finished cefdinir for diverticulitis and is still experiencing lower abd pain.  She inquired whether she has developed new diverticula on last CT abd pelvis.

## 2019-09-26 MED ORDER — DICYCLOMINE HCL 10 MG PO CAPS: 10.0000 mg | ORAL_CAPSULE | Freq: Three times a day (TID) | ORAL | 0 refills | Status: DC | PRN

## 2019-09-26 NOTE — Telephone Encounter (Signed)
Patient notified

## 2019-09-26 NOTE — Telephone Encounter (Signed)
Proceed with colonoscopy as scheduled.  Dicyclomine 10 mg po tid prn abdominal pain, #60, no refills

## 2019-10-11 ENCOUNTER — Ambulatory Visit (AMBULATORY_SURGERY_CENTER): Payer: 59 | Admitting: Gastroenterology

## 2019-10-11 ENCOUNTER — Encounter: Payer: Self-pay | Admitting: Gastroenterology

## 2019-10-11 ENCOUNTER — Other Ambulatory Visit: Payer: Self-pay

## 2019-10-11 VITALS — BP 113/79 | HR 77 | Temp 96.8°F | Resp 19 | Ht 67.0 in | Wt 247.0 lb

## 2019-10-11 DIAGNOSIS — K5730 Diverticulosis of large intestine without perforation or abscess without bleeding: Secondary | ICD-10-CM

## 2019-10-11 DIAGNOSIS — R933 Abnormal findings on diagnostic imaging of other parts of digestive tract: Secondary | ICD-10-CM | POA: Diagnosis not present

## 2019-10-11 DIAGNOSIS — K640 First degree hemorrhoids: Secondary | ICD-10-CM | POA: Diagnosis not present

## 2019-10-11 DIAGNOSIS — K5792 Diverticulitis of intestine, part unspecified, without perforation or abscess without bleeding: Secondary | ICD-10-CM | POA: Diagnosis not present

## 2019-10-11 DIAGNOSIS — D120 Benign neoplasm of cecum: Secondary | ICD-10-CM

## 2019-10-11 DIAGNOSIS — K6389 Other specified diseases of intestine: Secondary | ICD-10-CM

## 2019-10-11 DIAGNOSIS — D124 Benign neoplasm of descending colon: Secondary | ICD-10-CM

## 2019-10-11 DIAGNOSIS — K635 Polyp of colon: Secondary | ICD-10-CM | POA: Diagnosis not present

## 2019-10-11 MED ORDER — SODIUM CHLORIDE 0.9 % IV SOLN: 500.0000 mL | Freq: Once | INTRAVENOUS | Status: DC

## 2019-10-11 NOTE — Progress Notes (Signed)
Pt's states no medical or surgical changes since previsit or office visit. 

## 2019-10-11 NOTE — Patient Instructions (Signed)
YOU HAD AN ENDOSCOPIC PROCEDURE TODAY AT THE [facility]:   Refer to the procedure report that was given to you for any specific questions about what was found during the examination.  If the procedure report does not answer your questions, please call your gastroenterologist to clarify.  If you requested that your care partner not be given the details of your procedure findings, then the procedure report has been included in a sealed envelope for you to review at your convenience later.  YOU SHOULD EXPECT: Some feelings of bloating in the abdomen. Passage of more gas than usual.  Walking can help get rid of the air that was put into your GI tract during the procedure and reduce the bloating. If you had a lower endoscopy (such as a colonoscopy or flexible sigmoidoscopy) you may notice spotting of blood in your stool or on the toilet paper. If you underwent a bowel prep for your procedure, you may not have a normal bowel movement for a few days.  Please Note:  You might notice some irritation and congestion in your nose or some drainage.  This is from the oxygen used during your procedure.  There is no need for concern and it should clear up in a day or so.  SYMPTOMS TO REPORT IMMEDIATELY:   Following lower endoscopy (colonoscopy or flexible sigmoidoscopy):  Excessive amounts of blood in the stool  Significant tenderness or worsening of abdominal pains  Swelling of the abdomen that is new, acute  Fever of 100F or higher  For urgent or emergent issues, a gastroenterologist can be reached at any hour by calling [phone]. Do not use MyChart messaging for urgent concerns.    DIET:  We do recommend a small meal at first, but then you may proceed to your regular diet.  Drink plenty of fluids but you should avoid alcoholic beverages for 24 hours.  ACTIVITY:  You should plan to take it easy for the rest of today and you should NOT DRIVE or use heavy machinery until tomorrow (because  of the sedation medicines used during the test).    FOLLOW UP: Our staff will call the number listed on your records 48-72 hours following your procedure to check on you and address any questions or concerns that you may have regarding the information given to you following your procedure. If we do not reach you, we will leave a message.  We will attempt to reach you two times.  During this call, we will ask if you have developed any symptoms of COVID 19. If you develop any symptoms (ie: fever, flu-like symptoms, shortness of breath, cough etc.) before then, please call [phone].  If you test positive for Covid 19 in the 2 weeks post procedure, please call and report this information to us.    If any biopsies were taken you will be contacted by phone or by letter within the next 1-3 weeks.  Please call us at [phone] if you have not heard about the biopsies in 3 weeks.    SIGNATURES/CONFIDENTIALITY: You and/or your care partner have signed paperwork which will be entered into your electronic medical record.  These signatures attest to the fact that that the information above on your After Visit Summary has been reviewed and is understood.  Full responsibility of the confidentiality of this discharge information lies with you and/or your care-partner. 

## 2019-10-11 NOTE — Progress Notes (Signed)
To PACU, VSS. Report to [email or web address] 

## 2019-10-11 NOTE — Op Note (Signed)
[facility] Patient Name: Christine Rodgers Procedure Date: 10/11/2019 10:05 AM MRN: [id number] Endoscopist: Ladene Artist , MD Age: 59 Referring MD:  Date of Birth: 1960-09-30 Gender: Female Account #: [id number] Procedure:                Colonoscopy Indications:              Abnormal CT of the GI tract (colon), Follow-up of                            diverticulitis Medicines:                Monitored Anesthesia Care Procedure:                Pre-Anesthesia Assessment:                           - Prior to the procedure, a History and Physical                            was performed, and patient medications and                            allergies were reviewed. The patient's tolerance of                            previous anesthesia was also reviewed. The risks                            and benefits of the procedure and the sedation                            options and risks were discussed with the patient.                            All questions were answered, and informed consent                            was obtained. Prior Anticoagulants: The patient has                            taken no previous anticoagulant or antiplatelet                            agents. ASA Grade Assessment: II - A patient with                            mild systemic disease. After reviewing the risks                            and benefits, the patient was deemed in                            satisfactory condition to undergo the procedure.  After obtaining informed consent, the colonoscope                            was passed under direct vision. Throughout the                            procedure, the patient's blood pressure, pulse, and                            oxygen saturations were monitored continuously. The                            Colonoscope was introduced through the anus and                            advanced to the the cecum, identified by                          appendiceal orifice and ileocecal valve. The                            ileocecal valve, appendiceal orifice, and rectum                            were photographed. The quality of the bowel                            preparation was excellent. The colonoscopy was                            performed without difficulty. The patient tolerated                            the procedure well. Scope In: 10:14:41 AM Scope Out: 10:24:11 AM Scope Withdrawal Time: 0 hours 8 minutes 22 seconds  Total Procedure Duration: 0 hours 9 minutes 30 seconds  Findings:                 The perianal and digital rectal examinations were                            normal.                           A 4 mm polyp was found in the cecum. The polyp was                            sessile. The polyp was removed with a cold biopsy                            forceps. Resection and retrieval were complete.                           Two sessile polyps were found in the descending  colon. The polyps were 6 mm in size. These polyps                            were removed with a cold snare. Resection and                            retrieval were complete.                           Multiple small-mouthed diverticula were found in                            the left colon. There was evidence of diverticular                            spasm. There was no evidence of diverticular                            bleeding.                           Internal hemorrhoids were found during                            retroflexion. The hemorrhoids were small and Grade                            I (internal hemorrhoids that do not prolapse).                           The exam was otherwise without abnormality on                            direct and retroflexion views. Complications:            No immediate complications. Estimated blood loss:                            None. Estimated Blood  Loss:     Estimated blood loss: none. Impression:               - One 4 mm polyp in the cecum, removed with a cold                            biopsy forceps. Resected and retrieved.                           - Two 6 mm polyps in the descending colon, removed                            with a cold snare. Resected and retrieved.                           - Moderate diverticulosis in the left colon.                           -  Internal hemorrhoids.                           - The examination was otherwise normal on direct                            and retroflexion views. Recommendation:           - Repeat colonoscopy after studies are complete for                            surveillance based on pathology results.                           - Patient has a contact number available for                            emergencies. The signs and symptoms of potential                            delayed complications were discussed with the                            patient. Return to normal activities tomorrow.                            Written discharge instructions were provided to the                            patient.                           - High fiber diet.                           - Continue present medications.                           - Await pathology results. Ladene Artist, MD 10/11/2019 10:29:25 AM This report has been signed electronically.

## 2019-10-11 NOTE — Progress Notes (Signed)
Called to room to assist during endoscopic procedure.  Patient ID and intended procedure confirmed with present staff. Received instructions for my participation in the procedure from the performing physician.  

## 2019-10-13 ENCOUNTER — Telehealth: Payer: Self-pay

## 2019-10-13 NOTE — Telephone Encounter (Signed)
  Follow up Call-  Call back number 10/11/2019  Post procedure Call Back phone  # [phone]  Permission to leave phone message Yes  Some recent data might be hidden     Patient questions:  Do you have a fever, pain , or abdominal swelling? No. Pain Score  0 *  Have you tolerated food without any problems? Yes.    Have you been able to return to your normal activities? Yes.    Do you have any questions about your discharge instructions: Diet   No. Medications  No. Follow up visit  No.  Do you have questions or concerns about your Care? No.  Actions: * If pain score is 4 or above: No action needed, pain <4.  1. Have you developed a fever since your procedure? no  2.   Have you had an respiratory symptoms (SOB or cough) since your procedure? no  3.   Have you tested positive for COVID 19 since your procedure no  4.   Have you had any family members/close contacts diagnosed with the COVID 19 since your procedure?  no   If yes to any of these questions please route to Joylene John, RN and Erenest Rasher, RN

## 2019-10-14 ENCOUNTER — Ambulatory Visit: Payer: 59 | Admitting: Cardiology

## 2019-10-24 ENCOUNTER — Encounter: Payer: Self-pay | Admitting: Gastroenterology

## 2020-10-09 IMAGING — CT CT ABD-PELV W/ CM
2 of 5 series · 16 of 46 positions shown, 18 images · IV contrast (OMNIPAQUE 300)
Comparison: 07/25/2019

CLINICAL DATA: Persistent left lower quadrant pain, on antibiotics
for diverticulitis

EXAM:
CT ABDOMEN AND PELVIS WITH CONTRAST
TECHNIQUE: Multidetector CT imaging of the abdomen and pelvis was performed
using the standard protocol following bolus administration of
intravenous contrast.
CONTRAST:  100mL OMNIPAQUE IOHEXOL 300 MG/ML  SOLN

[Series 2: abd/pel w · axial · 0.75mm/px · z∈[-483,-78]mm · 13 of 91 slices shown, 15 images]
[im 5/91  soft-tissue]
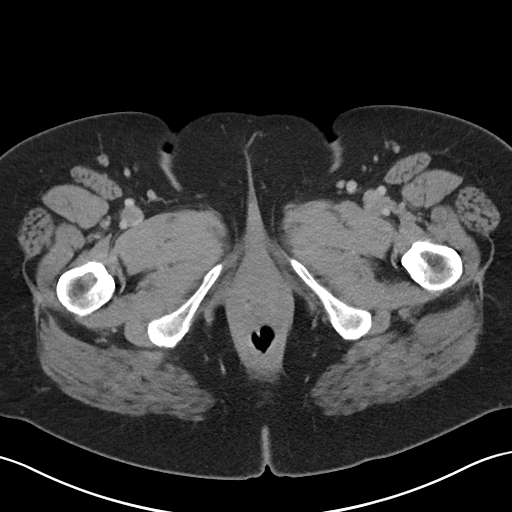
[im 5/91  bone]
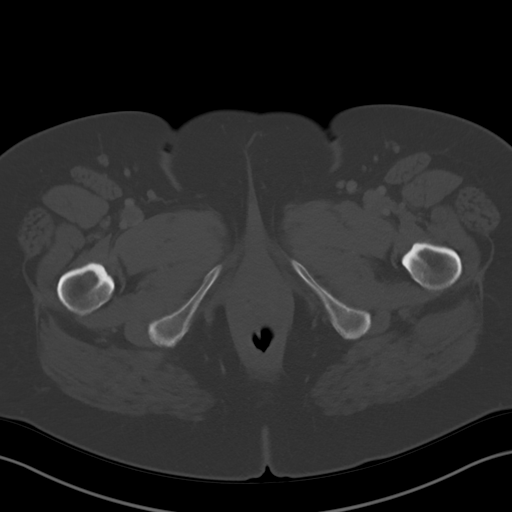
[im 15/91  soft-tissue]
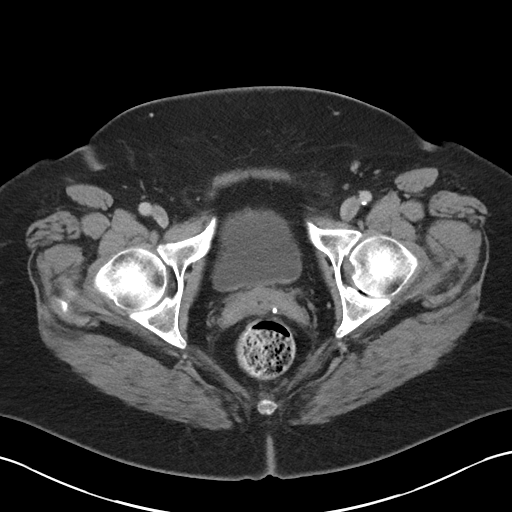
[im 19/91  soft-tissue]
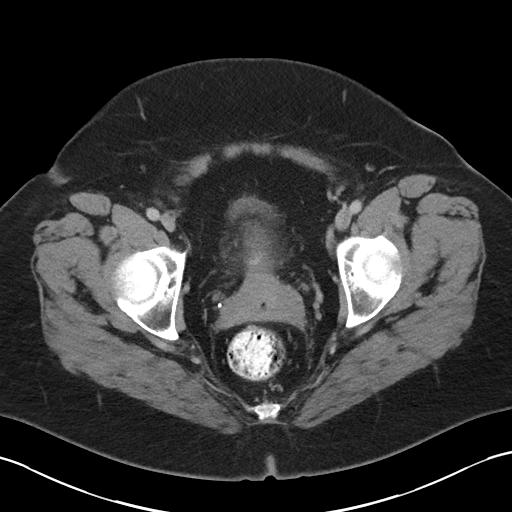
[im 24/91  soft-tissue]
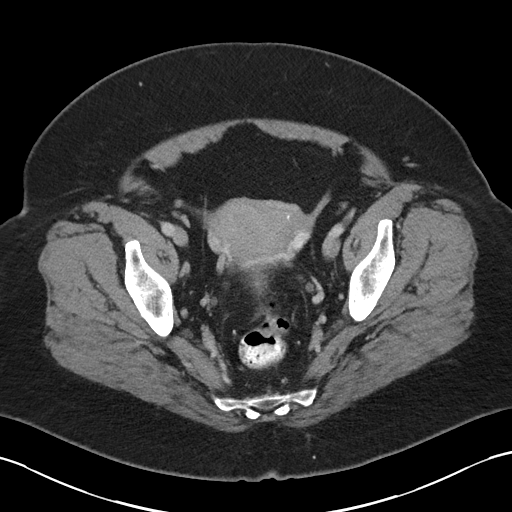
[im 34/91  soft-tissue]
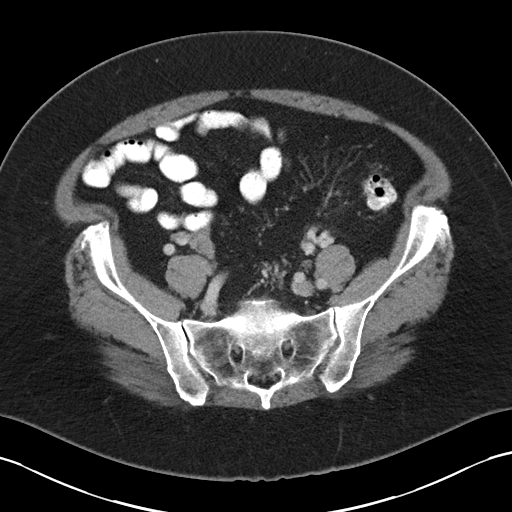
[im 38/91  soft-tissue]
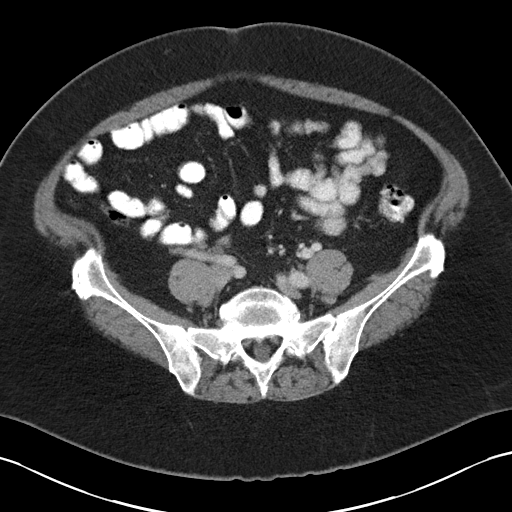
[im 48/91  soft-tissue]
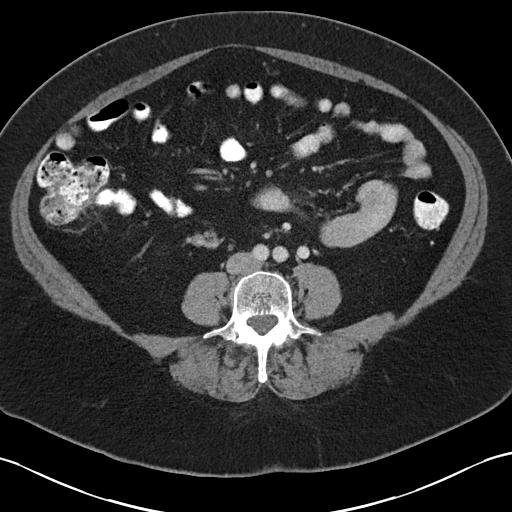
[im 53/91  soft-tissue]
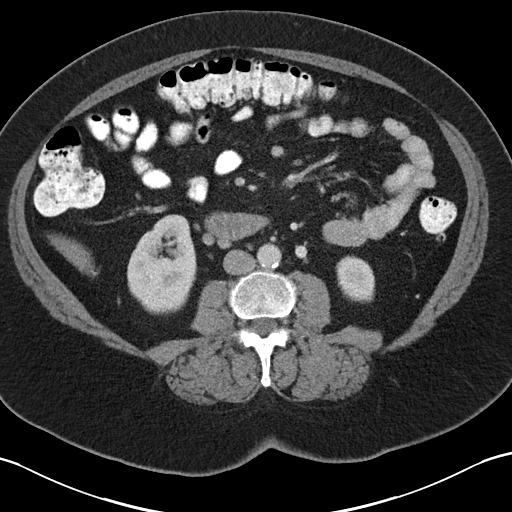
[im 57/91  soft-tissue]
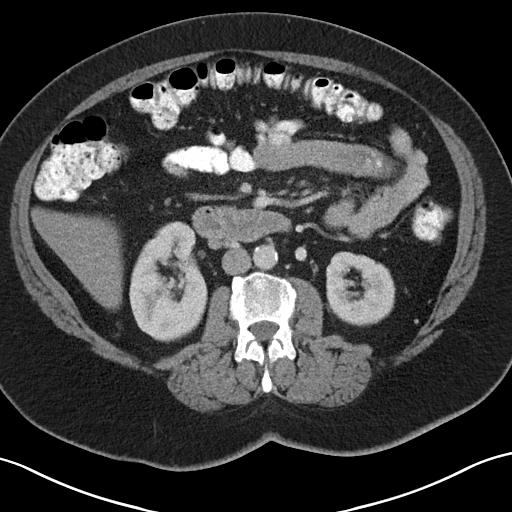
[im 57/91  bone]
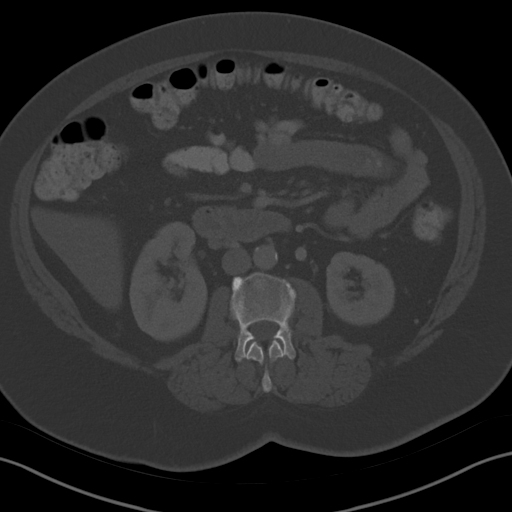
[im 67/91  soft-tissue]
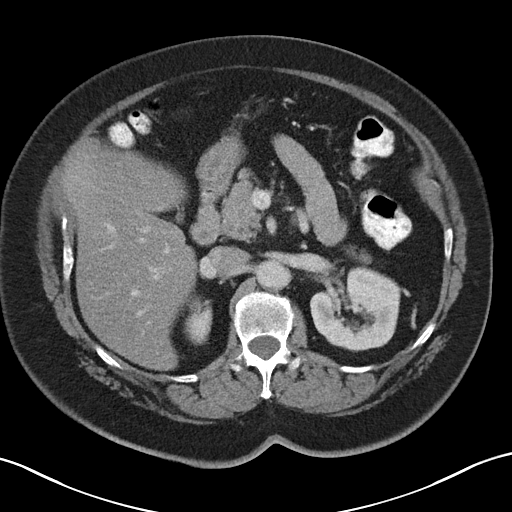
[im 72/91  soft-tissue]
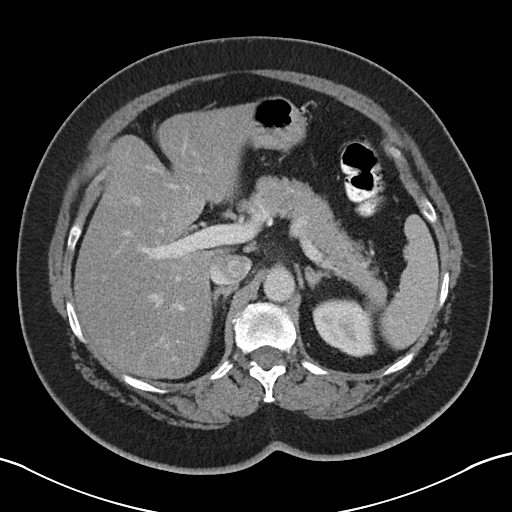
[im 76/91  soft-tissue]
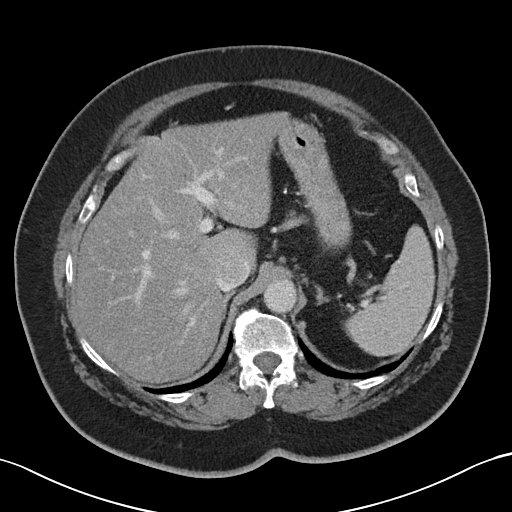
[im 86/91  soft-tissue]
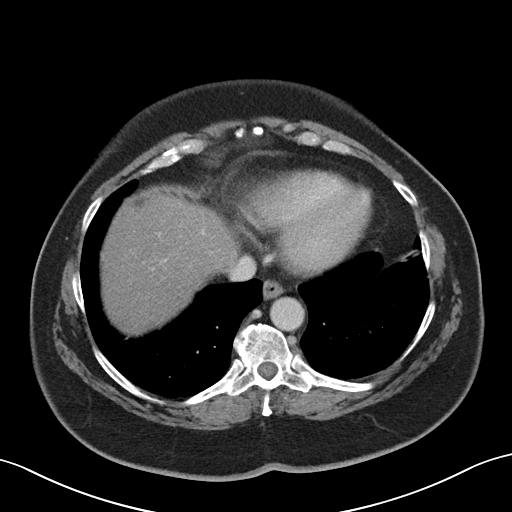

[Series 5: abd/pel w st · coronal · 0.80mm/px · 3 of 106 slices shown]
[im 36/106  soft-tissue]
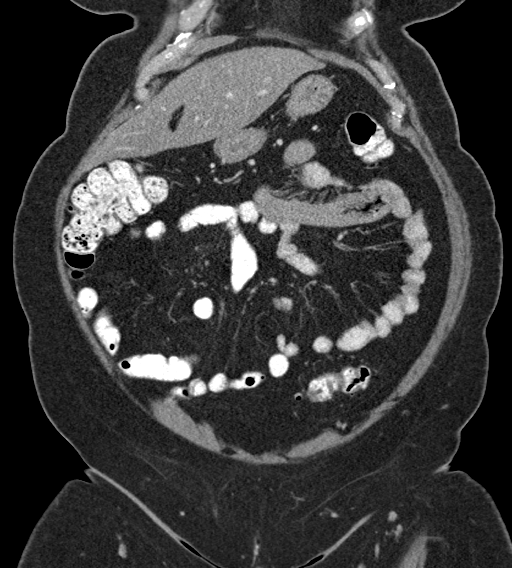
[im 47/106  soft-tissue]
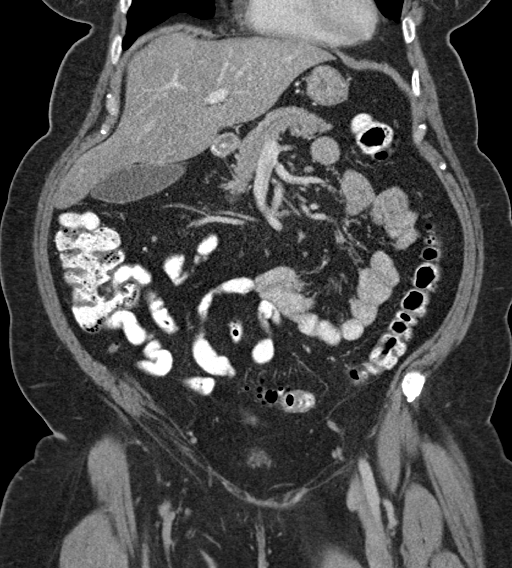
[im 59/106  soft-tissue]
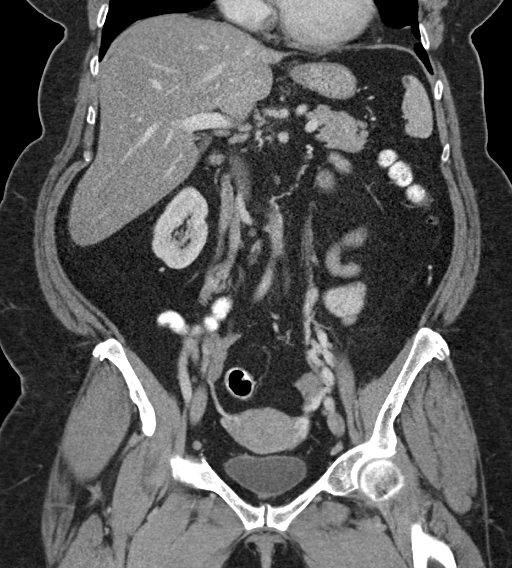

[16 of 46 positions shown; findings below may reference images not displayed]

FINDINGS: Lower chest: Lung bases are clear.

Hepatobiliary: Liver is within normal limits, noting probable
hepatic steatosis with focal fatty sparing.

Gallbladder is unremarkable. No intrahepatic or extrahepatic ductal
dilatation.

Pancreas: Within normal limits.

Spleen: Within normal limits.

Adrenals/Urinary Tract: Adrenal glands are within normal limits.

Kidneys are within normal limits.  No hydronephrosis.

Bladder is underdistended but unremarkable.

Stomach/Bowel: Stomach is within normal limits.

No evidence of bowel obstruction.

Normal appendix (series 2/image 55).

Diverticulosis with a mildly inflamed diverticula in the left lower
quadrant (series 2/image 60), reflecting mild residual sigmoid
diverticulitis, improved.

No drainable fluid collection/abscess. No free air to suggest
macroscopic perforation.

Vascular/Lymphatic: No evidence of abdominal aortic aneurysm.

Atherosclerotic calcifications of the abdominal aorta and branch
vessels.

No suspicious abdominopelvic lymphadenopathy.

Reproductive: Uterus is within normal limits.

Bilateral ovaries are within normal limits.

Other: No abdominopelvic ascites.

Musculoskeletal: Degenerative changes of the thoracic spine.
IMPRESSION: Mild residual sigmoid diverticulitis, improved.

No drainable fluid collection/abscess.  No free air.

## 2020-10-27 ENCOUNTER — Emergency Department (HOSPITAL_BASED_OUTPATIENT_CLINIC_OR_DEPARTMENT_OTHER)
Admission: EM | Admit: 2020-10-27 | Discharge: 2020-10-27 | Disposition: A | Discharge status: Home / Self Care | Payer: 59 | Attending: Emergency Medicine | Admitting: Emergency Medicine

## 2020-10-27 ENCOUNTER — Emergency Department (HOSPITAL_BASED_OUTPATIENT_CLINIC_OR_DEPARTMENT_OTHER): Payer: 59

## 2020-10-27 ENCOUNTER — Other Ambulatory Visit: Payer: Self-pay

## 2020-10-27 DIAGNOSIS — K2900 Acute gastritis without bleeding: Secondary | ICD-10-CM | POA: Insufficient documentation

## 2020-10-27 DIAGNOSIS — Z87891 Personal history of nicotine dependence: Secondary | ICD-10-CM | POA: Diagnosis not present

## 2020-10-27 DIAGNOSIS — U071 COVID-19: Secondary | ICD-10-CM | POA: Diagnosis not present

## 2020-10-27 DIAGNOSIS — J45909 Unspecified asthma, uncomplicated: Secondary | ICD-10-CM | POA: Diagnosis not present

## 2020-10-27 DIAGNOSIS — R1013 Epigastric pain: Secondary | ICD-10-CM

## 2020-10-27 DIAGNOSIS — R1011 Right upper quadrant pain: Secondary | ICD-10-CM

## 2020-10-27 LAB — CBC WITH DIFFERENTIAL/PLATELET
Abs Immature Granulocytes: 0.03 10*3/uL (ref 0.00–0.07)
Basophils Absolute: 0.1 10*3/uL (ref 0.0–0.1)
Basophils Relative: 1 %
Eosinophils Absolute: 0 10*3/uL (ref 0.0–0.5)
Eosinophils Relative: 0 %
HCT: 45 % (ref 36.0–46.0)
Hemoglobin: 15.2 g/dL — ABNORMAL HIGH (ref 12.0–15.0)
Immature Granulocytes: 0 %
Lymphocytes Relative: 8 %
Lymphs Abs: 0.6 10*3/uL — ABNORMAL LOW (ref 0.7–4.0)
MCH: 30.5 pg (ref 26.0–34.0)
MCHC: 33.8 g/dL (ref 30.0–36.0)
MCV: 90.4 fL (ref 80.0–100.0)
Monocytes Absolute: 0.8 10*3/uL (ref 0.1–1.0)
Monocytes Relative: 11 %
Neutro Abs: 5.7 10*3/uL (ref 1.7–7.7)
Neutrophils Relative %: 80 %
Platelets: 288 10*3/uL (ref 150–400)
RBC: 4.98 MIL/uL (ref 3.87–5.11)
RDW: 13.6 % (ref 11.5–15.5)
WBC: 7.2 10*3/uL (ref 4.0–10.5)
nRBC: 0 % (ref 0.0–0.2)

## 2020-10-27 LAB — URINALYSIS, ROUTINE W REFLEX MICROSCOPIC
Bilirubin Urine: NEGATIVE
Glucose, UA: NEGATIVE mg/dL
Ketones, ur: NEGATIVE mg/dL
Leukocytes,Ua: NEGATIVE
Nitrite: NEGATIVE
Protein, ur: NEGATIVE mg/dL
Specific Gravity, Urine: <1.005 — ABNORMAL LOW (ref 1.005–1.030)
pH: 6.5 (ref 5.0–8.0)

## 2020-10-27 LAB — LIPASE, BLOOD: Lipase: 51 U/L (ref 11–51)

## 2020-10-27 LAB — COMPREHENSIVE METABOLIC PANEL
ALT: 52 U/L — ABNORMAL HIGH (ref 0–44)
AST: 42 U/L — ABNORMAL HIGH (ref 15–41)
Albumin: 4.4 g/dL (ref 3.5–5.0)
Alkaline Phosphatase: 78 U/L (ref 38–126)
Anion gap: 10 (ref 5–15)
BUN: 11 mg/dL (ref 6–20)
CO2: 24 mmol/L (ref 22–32)
Calcium: 9 mg/dL (ref 8.9–10.3)
Chloride: 100 mmol/L (ref 98–111)
Creatinine, Ser: 0.56 mg/dL (ref 0.44–1.00)
GFR, Estimated: >60 mL/min (ref >60)
Glucose, Bld: 125 mg/dL — ABNORMAL HIGH (ref 70–99)
Potassium: 3.7 mmol/L (ref 3.5–5.1)
Sodium: 134 mmol/L — ABNORMAL LOW (ref 135–145)
Total Bilirubin: 0.5 mg/dL (ref 0.3–1.2)
Total Protein: 7.7 g/dL (ref 6.5–8.1)

## 2020-10-27 LAB — RESP PANEL BY RT-PCR (FLU A&B, COVID) ARPGX2
Influenza A by PCR: NEGATIVE
Influenza B by PCR: NEGATIVE
SARS Coronavirus 2 by RT PCR: POSITIVE — AB

## 2020-10-27 LAB — URINALYSIS, MICROSCOPIC (REFLEX)

## 2020-10-27 LAB — TROPONIN I (HIGH SENSITIVITY)
Troponin I (High Sensitivity): 5 ng/L (ref <18)
Troponin I (High Sensitivity): 5 ng/L (ref <18)

## 2020-10-27 MED ORDER — ONDANSETRON HCL 4 MG/2ML IJ SOLN
4.0000 mg | Freq: Once | INTRAMUSCULAR | Status: AC
  Administered 2020-10-27: 4 mg via INTRAVENOUS
  Filled 2020-10-27: qty 2

## 2020-10-27 MED ORDER — FENTANYL CITRATE (PF) 100 MCG/2ML IJ SOLN
50.0000 ug | Freq: Once | INTRAMUSCULAR | Status: AC
  Administered 2020-10-27: 50 ug via INTRAVENOUS
  Filled 2020-10-27: qty 2

## 2020-10-27 MED ORDER — ACETAMINOPHEN 325 MG PO TABS
650.0000 mg | ORAL_TABLET | Freq: Once | ORAL | Status: AC
  Administered 2020-10-27: 650 mg via ORAL
  Filled 2020-10-27: qty 2

## 2020-10-27 MED ORDER — SODIUM CHLORIDE 0.9 % IV BOLUS
1000.0000 mL | Freq: Once | INTRAVENOUS | Status: AC
Start: 2020-10-27 — End: 2020-10-27
  Administered 2020-10-27: 1000 mL via INTRAVENOUS

## 2020-10-27 MED ORDER — ONDANSETRON 4 MG PO TBDP: 4.0000 mg | ORAL_TABLET | Freq: Three times a day (TID) | ORAL | 0 refills | Status: DC | PRN

## 2020-10-27 MED ORDER — DICYCLOMINE HCL 10 MG PO CAPS: 10.0000 mg | ORAL_CAPSULE | Freq: Three times a day (TID) | ORAL | 0 refills | Status: AC | PRN

## 2020-10-27 NOTE — ED Notes (Signed)
Pain assessment - pt states that her back pain is gone - but states the frontal headache/sinuses  is still 7/10

## 2020-10-27 NOTE — ED Notes (Signed)
Called and informed of +COVID test

## 2020-10-27 NOTE — ED Triage Notes (Addendum)
Started last night - cp to stomach to back  Nausea and vomiting, fever, headache   Took tylenol @08 :30 this morning  central CP that radiates to the back

## 2020-10-27 NOTE — ED Provider Notes (Signed)
[facility] Provider Note  CSN: [id number] Arrival date & time: 10/27/20 1129    History Chief Complaint  Patient presents with  . Chest Pain    HPI  Christine Rodgers is a 60 y.o. female presents for evaluation of chest pain but actually points to her epigastric area. She reports she had a mild low backpain for the last several days she attributed to sciatica but since last night she has had sharp severe epigastric pain radiating into her back and associated with multiple episode of vomiting, unable to eat. She reports a low grade fever today, took some tylenol prior to arrival. She has had diverticulitis in the past, maybe having some vague lower abdominal discomfort but as severe as prior diverticulitis.    Past Medical History:  Diagnosis Date  . Allergy   . Anemia    occasional low hbg  . Arthritis    knees  . Asthma   . Dysrhythmia   . Eczema   . Family history of anesthesia complication    father   . Fibromyalgia   . History of chicken pox   . Hyperlipidemia   . Pneumonia    3-4 years ago    Past Surgical History:  Procedure Laterality Date  . BUNIONECTOMY  2008   right foot  . CERVICAL BIOPSY     benign  . KNEE ARTHROSCOPY Left 01/23/2014   Procedure: ARTHROSCOPY KNEE;  Surgeon: Vickey Huger, MD;  Location: [place];  Service: Orthopedics;  Laterality: Left;    Family History  Problem Relation Age of Onset  . Hyperlipidemia Mother   . Hyperlipidemia Father        Deceased 75- sepsis/?pneumonia  . Arrhythmia Father   . Diabetes Paternal Grandmother   . Stroke Paternal Grandmother   . Stroke Paternal Grandfather   . Ulcerative colitis Daughter   . Diabetes Maternal Grandmother   . Diabetes Maternal Grandfather   . Colon cancer Neg Hx   . Stomach cancer Neg Hx     Social History   Tobacco Use  . Smoking status: Former Smoker    Packs/day: 0.50    Years: 2.00    Pack years: 1.00    Types: Cigarettes    Quit date:  01/18/2004    Years since quitting: 16.7  . Smokeless tobacco: Never Used  Substance Use Topics  . Alcohol use: Yes    Alcohol/week: 7.0 standard drinks    Types: 7 Glasses of wine per week    Comment: occ  . Drug use: No     Home Medications Prior to Admission medications   Medication Sig Start Date End Date Taking? Authorizing Provider  cholecalciferol (VITAMIN D3) 25 MCG (1000 UNIT) tablet Take 1,000 Units by mouth daily.   Yes [provider]  co-enzyme Q-10 30 MG capsule Take 30 mg by mouth daily.   Yes [provider]  Elderberry 575 MG/5ML SYRP Take 5 mLs by mouth daily.   Yes [provider]  ondansetron (ZOFRAN ODT) 4 MG disintegrating tablet Take 1 tablet (4 mg total) by mouth every 8 (eight) hours as needed for nausea or vomiting. 10/27/20  Yes Truddie Hidden, MD  Turmeric 500 MG TABS Take 250 tablets by mouth daily.   Yes [provider]  albuterol (PROVENTIL HFA;VENTOLIN HFA) 108 (90 Base) MCG/ACT inhaler Inhale 2 puffs into the lungs every 6 (six) hours as needed for wheezing or shortness of breath. 08/31/18   Saguier, Percell Miller,  PA-C  dicyclomine (BENTYL) 10 MG capsule Take 1 capsule (10 mg total) by mouth 3 (three) times daily between meals as needed for spasms. 10/27/20   Truddie Hidden, MD  metoprolol succinate (TOPROL XL) 25 MG 24 hr tablet Take 0.5 tablets (12.5 mg total) by mouth daily. 09/15/19 10/27/20  Tobb, Godfrey Pick, DO     Allergies    Aspirin, Codeine, Doxycycline hyclate, Erythromycin, Metronidazole, Penicillins, Sulfonamide derivatives, and Levaquin [levofloxacin in d5w]   Review of Systems   Review of Systems A comprehensive review of systems was completed and negative except as noted in HPI.    Physical Exam BP 112/70 (BP Location: Right Arm)   Pulse (!) 105   Temp 100 F (37.8 C) (Oral)   Resp 14   Ht 5\' 7"  (1.702 m)   Wt 116.1 kg   LMP 05/22/2013 Comment: perimenopausal  SpO2 100%   BMI 40.10 kg/m    Physical Exam Vitals and nursing note reviewed.  Constitutional:      Appearance: Normal appearance.  HENT:     Head: Normocephalic and atraumatic.     Nose: Nose normal.     Mouth/Throat:     Mouth: Mucous membranes are moist.  Eyes:     Extraocular Movements: Extraocular movements intact.     Conjunctiva/sclera: Conjunctivae normal.  Cardiovascular:     Rate and Rhythm: Normal rate.  Pulmonary:     Effort: Pulmonary effort is normal.     Breath sounds: Normal breath sounds.  Abdominal:     General: Abdomen is flat.     Palpations: Abdomen is soft.     Tenderness: There is no abdominal tenderness (epigastric). There is no guarding or rebound.     Comments: Neg murphy's sign  Musculoskeletal:        General: No swelling. Normal range of motion.     Cervical back: Neck supple.  Skin:    General: Skin is warm and dry.  Neurological:     General: No focal deficit present.     Mental Status: She is alert.  Psychiatric:        Mood and Affect: Mood normal.      ED Results / Procedures / Treatments   Labs (all labs ordered are listed, but only abnormal results are displayed) Labs Reviewed  CBC WITH DIFFERENTIAL/PLATELET - Abnormal; Notable for the following components:      Result Value   Hemoglobin 15.2 (*)    Lymphs Abs 0.6 (*)    All other components within normal limits  COMPREHENSIVE METABOLIC PANEL - Abnormal; Notable for the following components:   Sodium 134 (*)    Glucose, Bld 125 (*)    AST 42 (*)    ALT 52 (*)    All other components within normal limits  URINALYSIS, ROUTINE W REFLEX MICROSCOPIC - Abnormal; Notable for the following components:   Color, Urine STRAW (*)    Specific Gravity, Urine <1.005 (*)    Hgb urine dipstick SMALL (*)    All other components within normal limits  URINALYSIS, MICROSCOPIC (REFLEX) - Abnormal; Notable for the following components:   Bacteria, UA FEW (*)    All other components within normal limits  RESP PANEL BY  RT-PCR (FLU A&B, COVID) ARPGX2  LIPASE, BLOOD  TROPONIN I (HIGH SENSITIVITY)  TROPONIN I (HIGH SENSITIVITY)    EKG EKG Interpretation  Date/Time:  Saturday Oct 27 2020 11:37:58 EDT Ventricular Rate:  106 PR Interval:  185 QRS Duration: 101 QT Interval:  347 QTC Calculation: 461 R Axis:   79 Text Interpretation: Sinus tachycardia LAE, consider biatrial enlargement Borderline T wave abnormalities No significant change since last tracing Confirmed by Calvert Cantor [phone]) on 10/27/2020 12:05:23 PM    Radiology DG Chest Portable 1 View  Result Date: 10/27/2020 CLINICAL DATA:  60 year old with acute onset of mid chest pain associated with nausea and vomiting that began this morning. EXAM: PORTABLE CHEST 1 VIEW COMPARISON:  01/17/2014 and earlier. FINDINGS: Cardiac silhouette and mediastinal contours normal in appearance for the AP portable technique. Pulmonary parenchyma clear. Bronchovascular markings normal. Pulmonary vascularity normal. No pneumothorax. No visible pleural effusions. IMPRESSION: No acute cardiopulmonary disease. Electronically Signed   By: Evangeline Dakin M.D.   On: 10/27/2020 12:03   US Abdomen Limited RUQ (LIVER/GB)  Result Date: 10/27/2020 CLINICAL DATA:  Epigastric pain, nausea and vomiting for 1 day. EXAM: ULTRASOUND ABDOMEN LIMITED RIGHT UPPER QUADRANT COMPARISON:  CT abdomen pelvis 09/13/2019 FINDINGS: Gallbladder: No gallstones or wall thickening visualized. No sonographic Murphy sign noted by sonographer. Common bile duct: Diameter: 0.2 cm, within normal limits Liver: No focal lesion identified. Diffusely increased parenchymal echogenicity. Portal vein is patent on color Doppler imaging with normal direction of blood flow towards the liver. Other: None. IMPRESSION: 1.  No acute finding sonographically in the right upper quadrant. 2. Diffusely increased liver parenchymal echogenicity which is nonspecific but most commonly seen with hepatic steatosis. Electronically  Signed   By: Audie Pinto M.D.   On: 10/27/2020 14:27    Procedures Procedures  Medications Ordered in the ED Medications  fentaNYL (SUBLIMAZE) injection 50 mcg (50 mcg Intravenous Given 10/27/20 1217)  ondansetron (ZOFRAN) injection 4 mg (4 mg Intravenous Given 10/27/20 1216)  sodium chloride 0.9 % bolus 1,000 mL (1,000 mLs Intravenous New Bag/Given 10/27/20 1216)  acetaminophen (TYLENOL) tablet 650 mg (650 mg Oral Given 10/27/20 1420)     MDM Rules/Calculators/A&P MDM Chief complaint is chest pain, labs including Trop and CXR were ordered by RN protocol, but her pain is really epigastric and radiating into her back. She reports she had one alcoholic drink last night but does not drink much on a regular basis. No prior history of gall bladder or liver problems. Will check labs for pancreatitis and hepatobiliary disease as well. Pain and nausea meds for comfort.  ED Course  I have reviewed the triage vital signs and the nursing notes.  Pertinent labs & imaging results that were available during my care of the patient were reviewed by me and considered in my medical decision making (see chart for details).  Clinical Course as of 10/27/20 1514  Sat Oct 27, 2020  1208 CBC is normal.  [CS]  1208 CXR is normal.  [CS]  1226 Trop and lipase normal. CMP with mildly elevated AST/ALT otherwise unremarkable.  [CS]  1336 UA unremarkable. Will send for [place] to eval gall stones. No signs of biliary obstruction or pancreatitis by labs.  [CS]  6045 Patient's temp is increased some. Will give APAP, last dose at home ~6 hours ago.  [CS]  1459 [place] neg for acute process. Second Trop is neg.  [CS]  4098 Patient reports she is feeling better. Relieved that ED workup is neg. She had taken a home Covid test which was neg, but requesting a PCR because her daughter is high risk.  [CS]    Clinical Course User Index [CS] Truddie Hidden, MD    Final Clinical Impression(s) / ED Diagnoses Final diagnoses:   Epigastric pain  Acute gastritis without hemorrhage, unspecified gastritis type    Rx / DC Orders ED Discharge Orders         Ordered    ondansetron (ZOFRAN ODT) 4 MG disintegrating tablet  Every 8 hours PRN        10/27/20 1513    dicyclomine (BENTYL) 10 MG capsule  3 times daily between meals PRN        10/27/20 1513           Truddie Hidden, MD 10/27/20 1514

## 2020-10-28 ENCOUNTER — Encounter: Payer: Self-pay | Admitting: Nurse Practitioner

## 2020-10-28 ENCOUNTER — Other Ambulatory Visit: Payer: Self-pay | Admitting: Nurse Practitioner

## 2020-10-28 DIAGNOSIS — U071 COVID-19: Secondary | ICD-10-CM

## 2020-10-28 MED ORDER — NIRMATRELVIR/RITONAVIR (PAXLOVID)TABLET
3.0000 | ORAL_TABLET | Freq: Two times a day (BID) | ORAL | 0 refills | Status: AC
Start: 2020-10-28 — End: 2020-11-02

## 2020-10-28 NOTE — Progress Notes (Signed)
Outpatient Oral COVID Treatment Note  I connected with Christine Rodgers on 10/28/2020/6:37 PM by telephone and verified that I am speaking with the correct person using two identifiers.  I discussed the limitations, risks, security, and privacy concerns of performing an evaluation and management service by telephone and the availability of in person appointments. I also discussed with the patient that there may be a patient responsible charge related to this service. The patient expressed understanding and agreed to proceed.  Patient location: Home Provider location: Office  Diagnosis: COVID-19 infection  Purpose of visit: Discussion of potential use of Molnupiravir or Paxlovid, a new treatment for mild to moderate COVID-19 viral infection in non-hospitalized patients.   Subjective: Patient is a 60 y.o. female who has been diagnosed with COVID 19 viral infection.  Their symptoms began on 10/26/2020 with cough, congestion, aches, low grade temp  Past Medical History:  Diagnosis Date  . Allergy   . Anemia    occasional low hbg  . Arthritis    knees  . Asthma   . Dysrhythmia   . Eczema   . Family history of anesthesia complication    father   . Fibromyalgia   . History of chicken pox   . Hyperlipidemia   . Pneumonia    3-4 years ago    Allergies  Allergen Reactions  . Aspirin     REACTION: severe nausea  . Codeine     REACTION: severe nausea  . Doxycycline Hyclate     REACTION: hives, throat swelling  . Erythromycin     Rash, gi side effects.    . Metronidazole Hives and Itching    Patient reports 30 minutes after taking itching , blisters and diarrhea occur  . Penicillins     REACTION: passed out  . Sulfonamide Derivatives     REACTION: diarrhea, vomitting  . Levaquin [Levofloxacin In D5w] Rash     Current Outpatient Medications:  .  albuterol (PROVENTIL HFA;VENTOLIN HFA) 108 (90 Base) MCG/ACT inhaler, Inhale 2 puffs into the lungs every 6 (six) hours as needed for  wheezing or shortness of breath., Disp: 1 Inhaler, Rfl: 2 .  cholecalciferol (VITAMIN D3) 25 MCG (1000 UNIT) tablet, Take 1,000 Units by mouth daily., Disp: , Rfl:  .  co-enzyme Q-10 30 MG capsule, Take 30 mg by mouth daily., Disp: , Rfl:  .  dicyclomine (BENTYL) 10 MG capsule, Take 1 capsule (10 mg total) by mouth 3 (three) times daily between meals as needed for spasms., Disp: 60 capsule, Rfl: 0 .  Elderberry 575 MG/5ML SYRP, Take 5 mLs by mouth daily., Disp: , Rfl:  .  ondansetron (ZOFRAN ODT) 4 MG disintegrating tablet, Take 1 tablet (4 mg total) by mouth every 8 (eight) hours as needed for nausea or vomiting., Disp: 20 tablet, Rfl: 0 .  Turmeric 500 MG TABS, Take 250 tablets by mouth daily., Disp: , Rfl:   Objective: Patient appears/sounds congested.  They are in no apparent distress.  Breathing is non labored.  Mood and behavior are normal.  Laboratory Data:  Recent Results (from the past 2160 hour(s))  CBC with Differential     Status: Abnormal   Collection Time: 10/27/20 11:38 AM  Result Value Ref Range   WBC 7.2 4.0 - 10.5 K/uL   RBC 4.98 3.87 - 5.11 MIL/uL   Hemoglobin 15.2 (H) 12.0 - 15.0 g/dL   HCT 45.0 36.0 - 46.0 %   MCV 90.4 80.0 - 100.0 fL   MCH 30.5  26.0 - 34.0 pg   MCHC 33.8 30.0 - 36.0 g/dL   RDW 13.6 11.5 - 15.5 %   Platelets 288 150 - 400 K/uL   nRBC 0.0 0.0 - 0.2 %   Neutrophils Relative % 80 %   Neutro Abs 5.7 1.7 - 7.7 K/uL   Lymphocytes Relative 8 %   Lymphs Abs 0.6 (L) 0.7 - 4.0 K/uL   Monocytes Relative 11 %   Monocytes Absolute 0.8 0.1 - 1.0 K/uL   Eosinophils Relative 0 %   Eosinophils Absolute 0.0 0.0 - 0.5 K/uL   Basophils Relative 1 %   Basophils Absolute 0.1 0.0 - 0.1 K/uL   Immature Granulocytes 0 %   Abs Immature Granulocytes 0.03 0.00 - 0.07 K/uL    Comment: Performed at [facility], [address] [place], [place] [address]  Comprehensive metabolic panel     Status: Abnormal   Collection Time: 10/27/20 11:38 AM  Result Value  Ref Range   Sodium 134 (L) 135 - 145 mmol/L   Potassium 3.7 3.5 - 5.1 mmol/L   Chloride 100 98 - 111 mmol/L   CO2 24 22 - 32 mmol/L   Glucose, Bld 125 (H) 70 - 99 mg/dL    Comment: Glucose reference range applies only to samples taken after fasting for at least 8 hours.   BUN 11 6 - 20 mg/dL   Creatinine, Ser 0.56 0.44 - 1.00 mg/dL   Calcium 9.0 8.9 - 10.3 mg/dL   Total Protein 7.7 6.5 - 8.1 g/dL   Albumin 4.4 3.5 - 5.0 g/dL   AST 42 (H) 15 - 41 U/L   ALT 52 (H) 0 - 44 U/L   Alkaline Phosphatase 78 38 - 126 U/L   Total Bilirubin 0.5 0.3 - 1.2 mg/dL   GFR, Estimated >60 >60 mL/min    Comment: (NOTE) Calculated using the CKD-EPI Creatinine Equation (2021)    Anion gap 10 5 - 15    Comment: Performed at [facility], [address] [place], [place] [address]  Lipase, blood     Status: None   Collection Time: 10/27/20 11:38 AM  Result Value Ref Range   Lipase 51 11 - 51 U/L    Comment: Performed at [facility] [address], [address] [place], [place] [address]  Troponin I (High Sensitivity)     Status: None   Collection Time: 10/27/20 11:38 AM  Result Value Ref Range   Troponin I (High Sensitivity) 5 <18 ng/L    Comment: (NOTE) Elevated high sensitivity troponin I (hsTnI) values and significant  changes across serial measurements may suggest ACS but many other  chronic and acute conditions are known to elevate hsTnI results.  Refer to the "Links" section for chest pain algorithms and additional  guidance. Performed at [facility], [address] [place], [place] [address]   Urinalysis, Routine w reflex microscopic Urine, Clean Catch     Status: Abnormal   Collection Time: 10/27/20 12:14 PM  Result Value Ref Range   Color, Urine STRAW (A) YELLOW   APPearance CLEAR CLEAR   Specific Gravity, Urine <1.005 (L) 1.005 - 1.030   pH 6.5 5.0 - 8.0   Glucose, UA NEGATIVE NEGATIVE mg/dL   Hgb urine dipstick SMALL (A) NEGATIVE   Bilirubin Urine NEGATIVE  NEGATIVE   Ketones, ur NEGATIVE NEGATIVE mg/dL   Protein, ur NEGATIVE NEGATIVE mg/dL   Nitrite NEGATIVE NEGATIVE   Leukocytes,Ua  NEGATIVE NEGATIVE    Comment: Performed at [facility], [address] [place], [place] [address]  Urinalysis, Microscopic (reflex)     Status: Abnormal   Collection Time: 10/27/20 12:14 PM  Result Value Ref Range   RBC / HPF 0-5 0 - 5 RBC/hpf   WBC, UA 0-5 0 - 5 WBC/hpf   Bacteria, UA FEW (A) NONE SEEN   Squamous Epithelial / LPF 0-5 0 - 5    Comment: Performed at [facility], [address] [place], [place] [address]  Troponin I (High Sensitivity)     Status: None   Collection Time: 10/27/20  2:08 PM  Result Value Ref Range   Troponin I (High Sensitivity) 5 <18 ng/L    Comment: (NOTE) Elevated high sensitivity troponin I (hsTnI) values and significant  changes across serial measurements may suggest ACS but many other  chronic and acute conditions are known to elevate hsTnI results.  Refer to the "Links" section for chest pain algorithms and additional  guidance. Performed at [facility], [address] [place], [place] [address]   Resp Panel by RT-PCR (Flu A&B, Covid) Nasopharyngeal Swab     Status: Abnormal   Collection Time: 10/27/20  3:18 PM   Specimen: Nasopharyngeal Swab; Nasopharyngeal(NP) swabs in vial transport medium  Result Value Ref Range   SARS Coronavirus 2 by RT PCR POSITIVE (A) NEGATIVE    Comment: RESULT CALLED TO, READ BACK BY AND VERIFIED WITH: Josph Macho RN [phone] PHILLIPS C (NOTE) SARS-CoV-2 target nucleic acids are DETECTED.  The SARS-CoV-2 RNA is generally detectable in upper respiratory specimens during the acute phase of infection. Positive results are indicative of the presence of the identified virus, but do not rule out bacterial infection or co-infection with other pathogens not detected by the test. Clinical correlation with patient history and other diagnostic information is  necessary to determine patient infection status. The expected result is Negative.  Fact Sheet for Patients: EntrepreneurPulse.com.au  Fact Sheet for Healthcare Providers: IncredibleEmployment.be  This test is not yet approved or cleared by the [place] FDA and  has been authorized for detection and/or diagnosis of SARS-CoV-2 by FDA under an Emergency Use Authorization (EUA).  This EUA will remain in effect (meaning this test can be  used) for the duration of  the COVID-19 declaration under Section 564(b)(1) of the Act, 21 U.S.C. section 360bbb-3(b)(1), unless the authorization is terminated or revoked sooner.     Influenza A by PCR NEGATIVE NEGATIVE   Influenza B by PCR NEGATIVE NEGATIVE    Comment: (NOTE) The Xpert Xpress SARS-CoV-2/FLU/RSV plus assay is intended as an aid in the diagnosis of influenza from Nasopharyngeal swab specimens and should not be used as a sole basis for treatment. Nasal washings and aspirates are unacceptable for Xpert Xpress SARS-CoV-2/FLU/RSV testing.  Fact Sheet for Patients: EntrepreneurPulse.com.au  Fact Sheet for Healthcare Providers: IncredibleEmployment.be  This test is not yet approved or cleared by the [place] FDA and has been authorized for detection and/or diagnosis of SARS-CoV-2 by FDA under an Emergency Use Authorization (EUA). This EUA will remain in effect (meaning this test can be used) for the duration of the COVID-19 declaration under Section 564(b)(1) of the Act, 21 U.S.C. section 360bbb-3(b)(1), unless the authorization is terminated or revoked.  Performed at [facility], [address] [place], [place] [address]      Assessment: 60 y.o. female with mild/moderate COVID 19  viral infection diagnosed on 10/27/2020 at high risk for progression to severe COVID 19.  Plan:  This patient is a 60 y.o. female that meets the following  criteria for Emergency Use Authorization of: Paxlovid 1. Age >12 yr AND > 40 kg 2. SARS-COV-2 positive test 3. Symptom onset < 5 days 4. Mild-to-moderate COVID disease with high risk for severe progression to hospitalization or death  I have spoken and communicated the following to the patient or parent/caregiver regarding: 1. Paxlovid is an unapproved drug that is authorized for use under an Emergency Use Authorization.  2. There are no adequate, approved, available products for the treatment of COVID-19 in adults who have mild-to-moderate COVID-19 and are at high risk for progressing to severe COVID-19, including hospitalization or death. 3. Other therapeutics are currently authorized. For additional information on all products authorized for treatment or prevention of COVID-19, please see [employer]Em[employer]ium.pl.  4. There are benefits and risks of taking this treatment as outlined in the "Fact Sheet for Patients and Caregivers."  5. "Fact Sheet for Patients and Caregivers" was reviewed with patient. A hard copy will be provided to patient from pharmacy prior to the patient receiving treatment. 6. Patients should continue to self-isolate and use infection control measures (e.g., wear mask, isolate, social distance, avoid sharing personal items, clean and disinfect "high touch" surfaces, and frequent handwashing) according to CDC guidelines.  7. The patient or parent/caregiver has the option to accept or refuse treatment. 8. Patient medication history was reviewed for potential drug interactions:No drug interactions 9. Patient's GFR was calculated to be 103, and they were therefore prescribed Normal dose (GFR>60) - nirmatrelvir 150mg  tab (2 tablet) by mouth twice daily AND ritonavir 100mg  tab (1 tablet) by mouth twice daily   After reviewing above information with the patient, the patient agrees to  receive Paxlovid.  Follow up instructions:    . Take prescription BID x 5 days as directed . Reach out to pharmacist for counseling on medication if desired . For concerns regarding further COVID symptoms please follow up with your PCP or urgent care . For urgent or life-threatening issues, seek care at your local emergency department  The patient was provided an opportunity to ask questions, and all were answered. The patient agreed with the plan and demonstrated an understanding of the instructions.   Script sent to [facility] as requested and opted to pick up RX.  The patient was advised to call their PCP or seek an in-person evaluation if the symptoms worsen or if the condition fails to improve as anticipated.   I provided 25  minutes of non face-to-face telephone visit time during this encounter, and > 50% was spent counseling as documented under my assessment & plan.  Jobe Gibbon, NP 10/28/2020 /6:37 PM

## 2021-01-28 LAB — HM PAP SMEAR: HM Pap smear: NORMAL

## 2021-01-28 LAB — HM MAMMOGRAPHY

## 2021-07-03 ENCOUNTER — Encounter: Payer: Self-pay | Admitting: Family

## 2021-07-03 ENCOUNTER — Ambulatory Visit (INDEPENDENT_AMBULATORY_CARE_PROVIDER_SITE_OTHER): Payer: 59 | Admitting: Family

## 2021-07-03 VITALS — BP 141/81 | HR 87 | Temp 98.6°F | Resp 16 | Wt 252.0 lb

## 2021-07-03 DIAGNOSIS — I10 Essential (primary) hypertension: Secondary | ICD-10-CM

## 2021-07-03 DIAGNOSIS — E162 Hypoglycemia, unspecified: Secondary | ICD-10-CM

## 2021-07-03 DIAGNOSIS — Z0000 Encounter for general adult medical examination without abnormal findings: Secondary | ICD-10-CM

## 2021-07-03 DIAGNOSIS — F410 Panic disorder [episodic paroxysmal anxiety] without agoraphobia: Secondary | ICD-10-CM

## 2021-07-03 DIAGNOSIS — E785 Hyperlipidemia, unspecified: Secondary | ICD-10-CM

## 2021-07-03 DIAGNOSIS — Z23 Encounter for immunization: Secondary | ICD-10-CM | POA: Diagnosis not present

## 2021-07-03 DIAGNOSIS — D582 Other hemoglobinopathies: Secondary | ICD-10-CM | POA: Diagnosis not present

## 2021-07-03 LAB — CBC WITH DIFFERENTIAL/PLATELET
Basophils Absolute: 0 10*3/uL (ref 0.0–0.1)
Basophils Relative: 0.8 % (ref 0.0–3.0)
Eosinophils Absolute: 0.3 10*3/uL (ref 0.0–0.7)
Eosinophils Relative: 5.2 % — ABNORMAL HIGH (ref 0.0–5.0)
HCT: 46.7 % — ABNORMAL HIGH (ref 36.0–46.0)
Hemoglobin: 15.3 g/dL — ABNORMAL HIGH (ref 12.0–15.0)
Lymphocytes Relative: 32.5 % (ref 12.0–46.0)
Lymphs Abs: 2 10*3/uL (ref 0.7–4.0)
MCHC: 32.7 g/dL (ref 30.0–36.0)
MCV: 90.9 fl (ref 78.0–100.0)
Monocytes Absolute: 0.4 10*3/uL (ref 0.1–1.0)
Monocytes Relative: 6.3 % (ref 3.0–12.0)
Neutro Abs: 3.5 10*3/uL (ref 1.4–7.7)
Neutrophils Relative %: 55.2 % (ref 43.0–77.0)
Platelets: 303 10*3/uL (ref 150.0–400.0)
RBC: 5.14 Mil/uL — ABNORMAL HIGH (ref 3.87–5.11)
RDW: 13.9 % (ref 11.5–15.5)
WBC: 6.3 10*3/uL (ref 4.0–10.5)

## 2021-07-03 LAB — FERRITIN: Ferritin: 52.9 ng/mL (ref 10.0–291.0)

## 2021-07-03 LAB — LIPID PANEL
Cholesterol: 310 mg/dL — ABNORMAL HIGH (ref 0–200)
HDL: 58.5 mg/dL (ref >39.00)
NonHDL: 251.72
Total CHOL/HDL Ratio: 5
Triglycerides: 212 mg/dL — ABNORMAL HIGH (ref 0.0–149.0)
VLDL: 42.4 mg/dL — ABNORMAL HIGH (ref 0.0–40.0)

## 2021-07-03 LAB — COMPREHENSIVE METABOLIC PANEL
ALT: 35 U/L (ref 0–35)
AST: 25 U/L (ref 0–37)
Albumin: 4.6 g/dL (ref 3.5–5.2)
Alkaline Phosphatase: 88 U/L (ref 39–117)
BUN: 12 mg/dL (ref 6–23)
CO2: 29 mEq/L (ref 19–32)
Calcium: 9.6 mg/dL (ref 8.4–10.5)
Chloride: 103 mEq/L (ref 96–112)
Creatinine, Ser: 0.65 mg/dL (ref 0.40–1.20)
GFR: 95.36 mL/min (ref >60.00)
Glucose, Bld: 105 mg/dL — ABNORMAL HIGH (ref 70–99)
Potassium: 4.3 mEq/L (ref 3.5–5.1)
Sodium: 141 mEq/L (ref 135–145)
Total Bilirubin: 0.8 mg/dL (ref 0.2–1.2)
Total Protein: 7.2 g/dL (ref 6.0–8.3)

## 2021-07-03 LAB — IRON: Iron: 130 ug/dL (ref 42–145)

## 2021-07-03 LAB — TSH: TSH: 1.6 u[IU]/mL (ref 0.35–5.50)

## 2021-07-03 LAB — LDL CHOLESTEROL, DIRECT: Direct LDL: 219 mg/dL

## 2021-07-03 MED ORDER — HYDROXYZINE PAMOATE 25 MG PO CAPS: 25.0000 mg | ORAL_CAPSULE | Freq: Three times a day (TID) | ORAL | 1 refills | Status: DC | PRN

## 2021-07-03 MED ORDER — LISINOPRIL 10 MG PO TABS: 10.0000 mg | ORAL_TABLET | Freq: Every day | ORAL | 3 refills | Status: DC

## 2021-07-03 NOTE — Progress Notes (Signed)
Subjective:     Patient ID: Christine Rodgers, female    DOB: January 15, 1961, 61 y.o.   MRN: [id number]  Chief Complaint  Patient presents with   Stress    HPI Patient is in today to discuss stress. She states that he husband has been diagnosed with advanced prostate cancer and has a poor prognosis. 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 She tried to get set up with counseling through her work but was unsuccessful as "they dropped the ball twice." Work has been extremely stressful and she has been working overtime and through the holidays. Has been experiencing panic attacks. Took prozac in the remote past and noted that it caused visual hallucinations.   Notes that her blood pressure has been having highs.  Reports that bp has been as high as the 180's.  She did not tolerate metoprolol (hives).    BP Readings from Last 3 Encounters:  07/03/21 (!) 145/94  10/27/20 122/63  10/11/19 113/79   Also notes that after she eats sugar she then develops symptomatic hypoglycemia with shaking.   Health Maintenance Due  Topic Date Due   Hepatitis C Screening  Never done   Zoster Vaccines- Shingrix (1 of 2) Never done   PAP SMEAR-Modifier  02/27/2013   COVID-19 Vaccine (5 - Booster) 11/09/2019   TETANUS/TDAP  03/15/2020    Past Medical History:  Diagnosis Date   Acute diverticulitis 07/25/2019   Allergy    Anemia    occasional low hbg   Arthritis    knees   Asthma    Dysrhythmia    Eczema    Family history of anesthesia complication    father    Fibromyalgia    History of chicken pox    Hyperlipidemia    Pneumonia    3-4 years ago    Past Surgical History:  Procedure Laterality Date   BUNIONECTOMY  2008   right foot   CERVICAL BIOPSY     benign   KNEE ARTHROSCOPY Left 01/23/2014   Procedure: ARTHROSCOPY KNEE;  Surgeon: Vickey Huger, MD;   Location: [place];  Service: Orthopedics;  Laterality: Left;    Family History  Problem Relation Age of Onset   Hyperlipidemia Mother    Hyperlipidemia Father        Deceased 47- sepsis/?pneumonia   Arrhythmia Father    Diabetes Paternal Grandmother    Stroke Paternal Grandmother    Stroke Paternal Grandfather    Ulcerative colitis Daughter    Diabetes Maternal Grandmother    Diabetes Maternal Grandfather    Colon cancer Neg Hx    Stomach cancer Neg Hx     Social History   Socioeconomic History   Marital status: Married    Spouse name: Not on file   Number of children: 3   Years of education: Not on file   Highest education level: Not on file  Occupational History   Occupation: EXECUTIVE ADMIN [employer]    Employer: UNEMPLOYED  Tobacco Use   Smoking status: Former    Packs/day: 0.50    Years: 2.00    Pack years: 1.00    Types: Cigarettes    Quit date: 01/18/2004    Years since quitting: 17.4   Smokeless tobacco: Never  Substance and Sexual Activity   Alcohol use: Yes    Alcohol/week: 7.0 standard  drinks    Types: 7 Glasses of wine per week    Comment: occ   Drug use: No   Sexual activity: Yes  Other Topics Concern   Not on file  Social History Narrative   Regular exercise:  No   Caffeine use:  2 mugs of coffee daily               Social Determinants of Health   Financial Resource Strain: Not on file  Food Insecurity: Not on file  Transportation Needs: Not on file  Physical Activity: Not on file  Stress: Not on file  Social Connections: Not on file  Intimate Partner Violence: Not on file    Outpatient Medications Prior to Visit  Medication Sig Dispense Refill   albuterol (PROVENTIL HFA;VENTOLIN HFA) 108 (90 Base) MCG/ACT inhaler Inhale 2 puffs into the lungs every 6 (six) hours as needed for wheezing or shortness of breath. 1 Inhaler 2   cholecalciferol (VITAMIN D3) 25 MCG (1000 UNIT) tablet Take 1,000 Units by mouth daily.     co-enzyme Q-10 30 MG capsule  Take 30 mg by mouth daily.     dicyclomine (BENTYL) 10 MG capsule Take 1 capsule (10 mg total) by mouth 3 (three) times daily between meals as needed for spasms. 60 capsule 0   Elderberry 575 MG/5ML SYRP Take 5 mLs by mouth daily.     ondansetron (ZOFRAN ODT) 4 MG disintegrating tablet Take 1 tablet (4 mg total) by mouth every 8 (eight) hours as needed for nausea or vomiting. 20 tablet 0   Turmeric 500 MG TABS Take 250 tablets by mouth daily.     No facility-administered medications prior to visit.    Allergies  Allergen Reactions   Aspirin     REACTION: severe nausea   Codeine     REACTION: severe nausea   Doxycycline Hyclate     REACTION: hives, throat swelling   Erythromycin     Rash, gi side effects.     Metronidazole Hives and Itching    Patient reports 30 minutes after taking itching , blisters and diarrhea occur   Penicillins     REACTION: passed out   Prozac [Fluoxetine]     Had hallucinations   Sulfonamide Derivatives     REACTION: diarrhea, vomitting   Levaquin [Levofloxacin In D5w] Rash    ROS See HPI    Objective:    Physical Exam Constitutional:      Appearance: Normal appearance.  HENT:     Head: Normocephalic and atraumatic.  Cardiovascular:     Rate and Rhythm: Normal rate.  Pulmonary:     Effort: Pulmonary effort is normal.  Neurological:     Mental Status: She is alert and oriented to person, place, and time.  Psychiatric:        Attention and Perception: Attention and perception normal.        Mood and Affect: Affect is tearful.        Speech: Speech normal.        Behavior: Behavior normal.        Cognition and Memory: Cognition normal.    BP (!) 145/94 (BP Location: Right Arm, Patient Position: Sitting, Cuff Size: Large)    Pulse 87    Temp 98.6 F (37 C) (Oral)    Resp 16    Wt 252 lb (114.3 kg)    LMP 05/22/2013 Comment: perimenopausal   SpO2 98%    BMI 39.47 kg/m  Wt Readings from  Last 3 Encounters:  07/03/21 252 lb (114.3 kg)   10/27/20 256 lb (116.1 kg)  10/11/19 247 lb (112 kg)       Assessment & Plan:   Problem List Items Addressed This Visit       Unprioritized   Panic attacks    She wishes to avoid daily medication if possible. Will give rx for prn atarax and gave her information to schedule an appointment with a counselor.       Relevant Medications   hydrOXYzine (VISTARIL) 25 MG capsule   Hypertension    Uncontrolled. Will give trial of lisinopril 67m once daily.       Relevant Medications   lisinopril (ZESTRIL) 10 MG tablet   Other Visit Diagnoses     Hypoglycemia    -  Primary   Relevant Orders   Comp Met (CMET)   Hyperlipidemia, unspecified hyperlipidemia type       Relevant Medications   lisinopril (ZESTRIL) 10 MG tablet   Other Relevant Orders   Lipid panel   Preventative health care       Relevant Orders   TSH   Elevated hemoglobin (HCC)       Relevant Orders   CBC with Differential/Platelet   Ferritin   Iron   Iron Binding Cap (TIBC)([facility])       I am having Christine Rodgers start on lisinopril and hydrOXYzine. I am also having her maintain her albuterol, cholecalciferol, Elderberry, co-enzyme Q-10, Turmeric, ondansetron, and dicyclomine.  Meds ordered this encounter  Medications   lisinopril (ZESTRIL) 10 MG tablet    Sig: Take 1 tablet (10 mg total) by mouth daily.    Dispense:  30 tablet    Refill:  3    Order Specific Question:   Supervising Provider    Answer:   BPenni HomansA [[id number]]   hydrOXYzine (VISTARIL) 25 MG capsule    Sig: Take 1 capsule (25 mg total) by mouth every 8 (eight) hours as needed.    Dispense:  30 capsule    Refill:  1    Order Specific Question:   Supervising Provider    Answer:   BPenni HomansA [[id number]]

## 2021-07-03 NOTE — Assessment & Plan Note (Signed)
She wishes to avoid daily medication if possible. Will give rx for prn atarax and gave her information to schedule an appointment with a counselor.

## 2021-07-03 NOTE — Assessment & Plan Note (Signed)
Uncontrolled. Will give trial of lisinopril 10mg  once daily.

## 2021-07-03 NOTE — Patient Instructions (Addendum)
Please complete lab work prior to leaving. Send me the dates of your last 2 covid shots. You may use atarax 3x daily as needed for anxiety. Consider getting established with a counselor.   Start lisinopril for blood pressure.

## 2021-07-04 ENCOUNTER — Telehealth: Payer: Self-pay | Admitting: Family

## 2021-07-04 LAB — IRON AND TIBC
Iron Saturation: 38 % (ref 15–55)
Iron: 123 ug/dL (ref 27–159)
Total Iron Binding Capacity: 326 ug/dL (ref 250–450)
UIBC: 203 ug/dL (ref 131–425)

## 2021-07-04 NOTE — Telephone Encounter (Signed)
See mychart.  

## 2021-07-17 ENCOUNTER — Ambulatory Visit (INDEPENDENT_AMBULATORY_CARE_PROVIDER_SITE_OTHER): Payer: 59 | Admitting: Family

## 2021-07-17 ENCOUNTER — Encounter: Payer: Self-pay | Admitting: Family

## 2021-07-17 ENCOUNTER — Telehealth: Payer: Self-pay | Admitting: Family

## 2021-07-17 VITALS — BP 110/68 | HR 93 | Temp 98.7°F | Resp 18 | Ht 67.0 in | Wt 253.8 lb

## 2021-07-17 DIAGNOSIS — I10 Essential (primary) hypertension: Secondary | ICD-10-CM

## 2021-07-17 DIAGNOSIS — F410 Panic disorder [episodic paroxysmal anxiety] without agoraphobia: Secondary | ICD-10-CM | POA: Diagnosis not present

## 2021-07-17 DIAGNOSIS — Z0000 Encounter for general adult medical examination without abnormal findings: Secondary | ICD-10-CM

## 2021-07-17 LAB — BASIC METABOLIC PANEL
BUN: 16 mg/dL (ref 6–23)
CO2: 28 mEq/L (ref 19–32)
Calcium: 9.5 mg/dL (ref 8.4–10.5)
Chloride: 103 mEq/L (ref 96–112)
Creatinine, Ser: 0.69 mg/dL (ref 0.40–1.20)
GFR: 93.97 mL/min (ref >60.00)
Glucose, Bld: 102 mg/dL — ABNORMAL HIGH (ref 70–99)
Potassium: 4.6 mEq/L (ref 3.5–5.1)
Sodium: 138 mEq/L (ref 135–145)

## 2021-07-17 MED ORDER — AMLODIPINE BESYLATE 2.5 MG PO TABS: 2.5000 mg | ORAL_TABLET | Freq: Every day | ORAL | 1 refills | Status: DC

## 2021-07-17 NOTE — Assessment & Plan Note (Signed)
Advised ok to try atarax prn once she starts the amlodipine if feeling well on amlodipine.

## 2021-07-17 NOTE — Telephone Encounter (Signed)
Please call [facility] and request copy of pap and mammogram.

## 2021-07-17 NOTE — Assessment & Plan Note (Addendum)
BP Readings from Last 3 Encounters:  07/17/21 110/68  07/03/21 (!) 141/81  10/27/20 122/63   BP looks good today, but I am concerned that she may be allergic to lisinopril.  Will d/c lisinopril and give trial of amlodipine 2.5mg  once daily.

## 2021-07-17 NOTE — Progress Notes (Signed)
Subjective:   By signing my name below, I, Zite Okoli, attest that this documentation has been prepared under the direction and in the presence of Debbrah Alar, NP 07/17/2021    Patient ID: Christine Rodgers, female    DOB: 11-Sep-1960, 61 y.o.   MRN: [id number]  Chief Complaint  Patient presents with   Annual Exam    HPI Patient is in today for a comprehensive physical exam.  Hypertension-  At her last visit, 10 mg lisinopril was added to manage her blood pressure. She reports she had side effects of diarrhea, abdominal problems and confusion after taking it in the morning. She is taking the medication at night now and it causes coughing and mucus buildup through the night. She thinks it is getting better at this time. She adds that when she first started it, it caused itching on her face and chest and fluid in her ears. Her blood pressure is stable at this visit. BP Readings from Last 3 Encounters:  07/17/21 110/68  07/03/21 (!) 141/81  10/27/20 122/63    Anxiety- She has not started atarax because she did not want to experience side effects along with the side effects from the blood pressure medication. She notes she had a busy day at work yesterday and was close to having a panic attack. She has an appointment with a counselor next week.  Mammogram- Last checked on 01/28/2021. Results were normal. Repeat in 1 year.  Colonoscopy- Last checked on 10/11/2019. Polyps found in the cecum and descending colon that were removed with a cold snare. Moderate diverticulosis in the left colon and internal hemorrhoids. Repeat in 10 years.  Pap Smear- Last checked on 02/27/2010. Results were normal. Reports it was done last year at her OBGYN.   Immunizations- She will get the shingles vaccine when she gets back from her vacation. She is UTD on tetanus and flu vaccines. She has 4 Covid-19 vaccines at this time.   Vision and Dental- She is UTD on both dental and vision.  Diet and Exercise-  She reports she is managing a healthy diet with eating salads and fiber bars and she drinks only water. However, she has not been exercising recently due to her husband's illness.   Social History- She drinks alcohol occasionally and does not use tobacco or vape products. She enjoys walking, kayaking, gardening and watching movies in her free time.   She denies having any unexpected weight change, ear pain, hearing loss and rhinorrhea, visual disturbance, cough, chest pain and leg swelling, nausea, vomiting, diarrhea and blood in stool, or dysuria and frequency, for myalgias and arthralgias, rash, headaches, adenopathy, depression or anxiety at this time   Past Medical History:  Diagnosis Date   Acute diverticulitis 07/25/2019   Allergy    Anemia    occasional low hbg   Arthritis    knees   Asthma    Dysrhythmia    Eczema    Family history of anesthesia complication    father    Fibromyalgia    History of chicken pox    Hyperlipidemia    Pneumonia    3-4 years ago    Past Surgical History:  Procedure Laterality Date   BUNIONECTOMY  2008   right foot   CERVICAL BIOPSY     benign   KNEE ARTHROSCOPY Left 01/23/2014   Procedure: ARTHROSCOPY KNEE;  Surgeon: Vickey Huger, MD;  Location: [place];  Service: Orthopedics;  Laterality: Left;    Family History  Problem Relation Age of Onset   Hyperlipidemia Mother    Hyperlipidemia Father        Deceased 20- sepsis/?pneumonia   Arrhythmia Father    Diabetes Paternal Grandmother    Stroke Paternal Grandmother    Stroke Paternal Grandfather    Ulcerative colitis Daughter    Diabetes Maternal Grandmother    Diabetes Maternal Grandfather    Colon cancer Neg Hx    Stomach cancer Neg Hx     Social History   Socioeconomic History   Marital status: Married    Spouse name: Not on file   Number of children: 3   Years of education: Not on file   Highest education level: Not on file  Occupational History   Occupation: EXECUTIVE ADMIN  [employer]    Employer: UNEMPLOYED  Tobacco Use   Smoking status: Former    Packs/day: 0.50    Years: 2.00    Pack years: 1.00    Types: Cigarettes    Quit date: 01/18/2004    Years since quitting: 17.5   Smokeless tobacco: Never  Substance and Sexual Activity   Alcohol use: Yes    Alcohol/week: 7.0 standard drinks    Types: 7 Glasses of wine per week    Comment: occ   Drug use: No   Sexual activity: Yes  Other Topics Concern   Not on file  Social History Narrative   Regular exercise:  NoCaffeine use:  2 mugs of coffee daily   Works as a Building surveyor for [employer]      Has 3 children, 2 grown one at home   5 grandchildren   Enjoys walking, kayaking, movie, gardening.   Social Determinants of Health   Financial Resource Strain: Not on file  Food Insecurity: Not on file  Transportation Needs: Not on file  Physical Activity: Not on file  Stress: Not on file  Social Connections: Not on file  Intimate Partner Violence: Not on file    Outpatient Medications Prior to Visit  Medication Sig Dispense Refill   albuterol (PROVENTIL HFA;VENTOLIN HFA) 108 (90 Base) MCG/ACT inhaler Inhale 2 puffs into the lungs every 6 (six) hours as needed for wheezing or shortness of breath. 1 Inhaler 2   cholecalciferol (VITAMIN D3) 25 MCG (1000 UNIT) tablet Take 1,000 Units by mouth daily.     co-enzyme Q-10 30 MG capsule Take 30 mg by mouth daily.     dicyclomine (BENTYL) 10 MG capsule Take 1 capsule (10 mg total) by mouth 3 (three) times daily between meals as needed for spasms. 60 capsule 0   Elderberry 575 MG/5ML SYRP Take 5 mLs by mouth daily.     hydrOXYzine (VISTARIL) 25 MG capsule Take 1 capsule (25 mg total) by mouth every 8 (eight) hours as needed. 30 capsule 1   ondansetron (ZOFRAN ODT) 4 MG disintegrating tablet Take 1 tablet (4 mg total) by mouth every 8 (eight) hours as needed for nausea or vomiting. 20 tablet 0   Turmeric 500 MG TABS Take 250 tablets by mouth daily.      lisinopril (ZESTRIL) 10 MG tablet Take 1 tablet (10 mg total) by mouth daily. 30 tablet 3   No facility-administered medications prior to visit.    Allergies  Allergen Reactions   Aspirin     REACTION: severe nausea   Codeine     REACTION: severe nausea   Doxycycline Hyclate     REACTION: hives, throat swelling   Erythromycin  Rash, gi side effects.     Lisinopril Itching    Cough, congestion, itching, nasal congestion   Metronidazole Hives and Itching    Patient reports 30 minutes after taking itching , blisters and diarrhea occur   Penicillins     REACTION: passed out   Prozac [Fluoxetine]     Had hallucinations   Sulfonamide Derivatives     REACTION: diarrhea, vomitting   Levaquin [Levofloxacin In D5w] Rash    Review of Systems  Constitutional:  Negative for fever.  HENT:  Negative for ear pain and hearing loss.        (-)nystagmus (-)adenopathy  Eyes:  Negative for blurred vision.  Respiratory:  Positive for cough (related to new medication). Negative for shortness of breath and wheezing.   Cardiovascular:  Negative for chest pain and leg swelling.  Gastrointestinal:  Positive for diarrhea (related to new medication). Negative for blood in stool, nausea and vomiting.  Genitourinary:  Negative for dysuria and frequency.  Musculoskeletal:  Negative for joint pain and myalgias.  Skin:  Negative for rash.  Neurological:  Negative for headaches.  Psychiatric/Behavioral:  Negative for depression. The patient is not nervous/anxious.       Objective:    Physical Exam Constitutional:      General: She is not in acute distress.    Appearance: Normal appearance. She is not ill-appearing.  HENT:     Head: Normocephalic and atraumatic.     Right Ear: Tympanic membrane, ear canal and external ear normal.     Left Ear: Tympanic membrane, ear canal and external ear normal.  Eyes:     Extraocular Movements: Extraocular movements intact.     Pupils: Pupils are equal,  round, and reactive to light.  Cardiovascular:     Rate and Rhythm: Normal rate and regular rhythm.     Pulses: Normal pulses.     Heart sounds: Normal heart sounds. No murmur heard. Pulmonary:     Effort: Pulmonary effort is normal. No respiratory distress.     Breath sounds: Normal breath sounds. No wheezing or rhonchi.  Abdominal:     General: Bowel sounds are normal. There is no distension.     Palpations: Abdomen is soft.     Tenderness: There is no abdominal tenderness. There is no guarding or rebound.  Musculoskeletal:     Cervical back: Neck supple.  Lymphadenopathy:     Cervical: No cervical adenopathy.  Skin:    General: Skin is warm and dry.  Neurological:     Mental Status: She is alert and oriented to person, place, and time.     Deep Tendon Reflexes:     Reflex Scores:      Patellar reflexes are 2+ on the right side and 2+ on the left side. Psychiatric:        Behavior: Behavior normal.        Judgment: Judgment normal.    BP 110/68    Pulse 93    Temp 98.7 F (37.1 C)    Resp 18    Ht 5\' 7"  (1.702 m)    Wt 253 lb 12.8 oz (115.1 kg)    LMP 05/22/2013 Comment: perimenopausal   SpO2 98%    BMI 39.75 kg/m  Wt Readings from Last 3 Encounters:  07/17/21 253 lb 12.8 oz (115.1 kg)  07/03/21 252 lb (114.3 kg)  10/27/20 256 lb (116.1 kg)     Assessment & Plan:   Problem List Items Addressed This Visit  Unprioritized   Routine general medical examination at a health care facility    Wt Readings from Last 3 Encounters:  07/17/21 253 lb 12.8 oz (115.1 kg)  07/03/21 252 lb (114.3 kg)  10/27/20 256 lb (116.1 kg)  Reports that she has been doing well with diet.  Encouraged pt to increase his exercise.  She declines shingrix today- wishes to do another time.  Pap/mammo/colo up to date.  It appears that she has not had the new bivalent booster. She will check with her pharmacy and update if needed.       Panic attacks    Advised ok to try atarax prn once she  starts the amlodipine if feeling well on amlodipine.       Hypertension    BP Readings from Last 3 Encounters:  07/17/21 110/68  07/03/21 (!) 141/81  10/27/20 122/63  BP looks good today, but I am concerned that she may be allergic to lisinopril.  Will d/c lisinopril and give trial of amlodipine 2.5mg  once daily.       Relevant Medications   amLODipine (NORVASC) 2.5 MG tablet   Other Relevant Orders   Basic metabolic panel    Meds ordered this encounter  Medications   amLODipine (NORVASC) 2.5 MG tablet    Sig: Take 1 tablet (2.5 mg total) by mouth daily.    Dispense:  30 tablet    Refill:  1    Order Specific Question:   Supervising Provider    Answer:   Penni Homans A [[id number]]    I,Zite Okoli,acting as a scribe for Nance Pear, NP.,have documented all relevant documentation on the behalf of Nance Pear, NP,as directed by  Nance Pear, NP while in the presence of Nance Pear, NP.   I, Debbrah Alar, NP, personally preformed the services described in this documentation.  All medical record entries made by the scribe were at my direction and in my presence.  I have reviewed the chart and discharge instructions (if applicable) and agree that the record reflects my personal performance and is accurate and complete. 07/17/2021

## 2021-07-17 NOTE — Patient Instructions (Addendum)
Stop lisinopril, start amlodipine. Send me your blood pressure readings via mychart in a few days.  Has 3 children, 2 grown one at home 5 grandchildren Enjoys walking, kayaking, movie, gardening.

## 2021-07-17 NOTE — Telephone Encounter (Signed)
Records release faxed 

## 2021-07-17 NOTE — Assessment & Plan Note (Addendum)
Wt Readings from Last 3 Encounters:  07/17/21 253 lb 12.8 oz (115.1 kg)  07/03/21 252 lb (114.3 kg)  10/27/20 256 lb (116.1 kg)   Reports that she has been doing well with diet.  Encouraged pt to increase his exercise.  She declines shingrix today- wishes to do another time.  Pap/mammo/colo up to date.  It appears that she has not had the new bivalent booster. She will check with her pharmacy and update if needed.

## 2021-07-18 ENCOUNTER — Telehealth: Payer: Self-pay | Admitting: Family

## 2021-07-18 MED ORDER — HYDROCHLOROTHIAZIDE 25 MG PO TABS: 25.0000 mg | ORAL_TABLET | Freq: Every day | ORAL | 1 refills | Status: DC

## 2021-07-18 NOTE — Telephone Encounter (Signed)
Patient advised of provider's comments and new prescription. She verbalized understanding.

## 2021-07-18 NOTE — Telephone Encounter (Signed)
Patient stated meds that were prescribed during yesterday's visit is not working and would like alternate suggestions

## 2021-07-18 NOTE — Telephone Encounter (Signed)
Stop amlodipine, start hctz 25mg  once daily beginning tomorrow and follow up in 1 week.

## 2021-07-19 NOTE — Telephone Encounter (Signed)
FYI Patient reports Bp better this morning after taking HCTZ 135/97. Has a headache but no other symptoms or side effects.

## 2021-07-31 ENCOUNTER — Ambulatory Visit: Payer: 59 | Admitting: Family

## 2021-07-31 ENCOUNTER — Ambulatory Visit (HOSPITAL_BASED_OUTPATIENT_CLINIC_OR_DEPARTMENT_OTHER)
Admission: RE | Admit: 2021-07-31 | Discharge: 2021-07-31 | Disposition: A | Discharge status: Home / Self Care | Payer: 59 | Source: Ambulatory Visit | Attending: Family | Admitting: Family

## 2021-07-31 ENCOUNTER — Other Ambulatory Visit: Payer: Self-pay

## 2021-07-31 VITALS — BP 129/77 | HR 85 | Temp 98.2°F | Resp 16 | Wt 252.0 lb

## 2021-07-31 DIAGNOSIS — R1032 Left lower quadrant pain: Secondary | ICD-10-CM

## 2021-07-31 NOTE — Progress Notes (Signed)
Subjective:   By signing my name below, I, Shehryar Baig, attest that this documentation has been prepared under the direction and in the presence of Debbrah Alar, NP 07/31/2021     Patient ID: Christine Rodgers, female    DOB: 10/05/1960, 61 y.o.   MRN: [id number]  Chief Complaint  Patient presents with   Groin Pain    Complains of groin pain on left side after falling yesterday    HPI Patient is in today for a office visit.  Fall- She reports having a recent fall while in her bathroom. She hit her foot and did a split while falling. She felt fine after the fall but after walking out of the room she felt like she pulled a muscle in her groin. The pain was severe enough to make her feel like nausea. When stretching her leg she felt pain shot down her left leg. She continues having pain while laterally extending her legs.    Health Maintenance Due  Topic Date Due   Hepatitis C Screening  Never done   Zoster Vaccines- Shingrix (1 of 2) Never done   PAP SMEAR-Modifier  02/27/2013    Past Medical History:  Diagnosis Date   Acute diverticulitis 07/25/2019   Allergy    Anemia    occasional low hbg   Arthritis    knees   Asthma    Dysrhythmia    Eczema    Family history of anesthesia complication    father    Fibromyalgia    History of chicken pox    Hyperlipidemia    Pneumonia    3-4 years ago    Past Surgical History:  Procedure Laterality Date   BUNIONECTOMY  2008   right foot   CERVICAL BIOPSY     benign   KNEE ARTHROSCOPY Left 01/23/2014   Procedure: ARTHROSCOPY KNEE;  Surgeon: Vickey Huger, MD;  Location: [facility];  Service: Orthopedics;  Laterality: Left;    Family History  Problem Relation Age of Onset   Hyperlipidemia Mother    Hyperlipidemia Father        Deceased 36- sepsis/?pneumonia   Arrhythmia Father    Diabetes Paternal Grandmother    Stroke Paternal Grandmother    Stroke Paternal Grandfather    Ulcerative colitis Daughter    Diabetes Maternal  Grandmother    Diabetes Maternal Grandfather    Colon cancer Neg Hx    Stomach cancer Neg Hx     Social History   Socioeconomic History   Marital status: Married    Spouse name: Not on file   Number of children: 3   Years of education: Not on file   Highest education level: Not on file  Occupational History   Occupation: EXECUTIVE ADMIN AS[employer]    Employer: UNEMPLOYED  Tobacco Use   Smoking status: Former    Packs/day: 0.50    Years: 2.00    Pack years: 1.00    Types: Cigarettes    Quit date: 01/18/2004    Years since quitting: 17.5   Smokeless tobacco: Never  Substance and Sexual Activity   Alcohol use: Yes    Alcohol/week: 7.0 standard drinks    Types: 7 Glasses of wine per week    Comment: occ   Drug use: No   Sexual activity: Yes  Other Topics Concern   Not on file  Social History Narrative   Regular exercise:  NoCaffeine use:  2 mugs of coffee daily   Works as a Teacher, 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   Social Determinants of Health   Financial Resource Strain: Not on file  Food Insecurity: Not on file  Transportation Needs: Not on file  Physical Activity: Not on file  Stress: Not on file  Social Connections: Not on file  Intimate Partner Violence: Not on file    Outpatient Medications Prior to Visit  Medication Sig Dispense Refill   albuterol (PROVENTIL HFA;VENTOLIN HFA) 108 (90 Base) MCG/ACT inhaler Inhale 2 puffs into the lungs every 6 (six) hours as needed for wheezing or shortness of breath. 1 Inhaler 2   cholecalciferol (VITAMIN D3) 25 MCG (1000 UNIT) tablet Take 1,000 Units by mouth daily.     co-enzyme Q-10 30 MG capsule Take 30 mg by mouth daily.     dicyclomine (BENTYL) 10 MG capsule Take 1 capsule (10 mg total) by mouth 3 (three) times daily between meals as needed for spasms. 60 capsule 0   Elderberry 575 MG/5ML SYRP Take 5 mLs by mouth  daily.     hydrochlorothiazide (HYDRODIURIL) 25 MG tablet Take 1 tablet (25 mg total) by mouth daily. 30 tablet 1   hydrOXYzine (VISTARIL) 25 MG capsule Take 1 capsule (25 mg total) by mouth every 8 (eight) hours as needed. 30 capsule 1   ondansetron (ZOFRAN ODT) 4 MG disintegrating tablet Take 1 tablet (4 mg total) by mouth every 8 (eight) hours as needed for nausea or vomiting. 20 tablet 0   Turmeric 500 MG TABS Take 250 tablets by mouth daily.     No facility-administered medications prior to visit.    Allergies  Allergen Reactions   Amlodipine Nausea Only   Aspirin     REACTION: severe nausea   Codeine     REACTION: severe nausea   Doxycycline Hyclate     REACTION: hives, throat swelling   Erythromycin     Rash, gi side effects.     Lisinopril Itching    Cough, congestion, itching, nasal congestion   Metronidazole Hives and Itching    Patient reports 30 minutes after taking itching , blisters and diarrhea occur   Penicillins     REACTION: passed out   Prozac [Fluoxetine]     Had hallucinations   Sulfonamide Derivatives     REACTION: diarrhea, vomitting   Levaquin [Levofloxacin In D5w] Rash    Review of Systems  Musculoskeletal:        (+)groin pain (+)left leg pain      Objective:    Physical Exam Constitutional:      General: She is not in acute distress.    Appearance: Normal appearance. She is not ill-appearing.  HENT:     Head: Normocephalic and atraumatic.     Right Ear: External ear normal.     Left Ear: External ear normal.  Eyes:     Extraocular Movements: Extraocular movements intact.     Pupils: Pupils are equal, round, and reactive to light.  Cardiovascular:     Rate and Rhythm: Normal rate and regular rhythm.     Heart sounds: Normal heart sounds. No murmur heard.   No gallop.  Pulmonary:     Effort: Pulmonary effort is normal. No respiratory distress.     Breath sounds: Normal breath sounds. No wheezing or rales.  Musculoskeletal:      Comments: Pain while lifting left leg while  supine Pain with left leg adduction  Skin:    General: Skin is warm and dry.  Neurological:     Mental Status: She is alert and oriented to person, place, and time.  Psychiatric:        Behavior: Behavior normal.    BP 129/77 (BP Location: Right Arm, Patient Position: Sitting, Cuff Size: Large)    Pulse 85    Temp 98.2 F (36.8 C) (Oral)    Resp 16    Wt 252 lb (114.3 kg)    LMP 05/22/2013 Comment: perimenopausal   SpO2 100%    BMI 39.47 kg/m  Wt Readings from Last 3 Encounters:  07/31/21 252 lb (114.3 kg)  07/17/21 253 lb 12.8 oz (115.1 kg)  07/03/21 252 lb (114.3 kg)       Assessment & Plan:   Problem List Items Addressed This Visit       Unprioritized   Groin pain, left - Primary    Hx concerning for possible pelvic fracture vs musculoskeletal injury. Will obtain pelvic/left hip x-ray for further evaluation.       Relevant Orders   DG HIP UNILAT WITH PELVIS 2-3 VIEWS LEFT     No orders of the defined types were placed in this encounter.   Cordella Register, NP, personally preformed the services described in this documentation.  All medical record entries made by the scribe were at my direction and in my presence.  I have reviewed the chart and discharge instructions (if applicable) and agree that the record reflects my personal performance and is accurate and complete. 07/31/2021   I,Shehryar Baig,acting as a Education administrator for Nance Pear, NP.,have documented all relevant documentation on the behalf of Nance Pear, NP,as directed by  Nance Pear, NP while in the presence of Nance Pear, NP.   Nance Pear, NP

## 2021-07-31 NOTE — Patient Instructions (Signed)
Please complete x ray on the first floor. We will contact you with your results.  

## 2021-07-31 NOTE — Assessment & Plan Note (Signed)
Hx concerning for possible pelvic fracture vs musculoskeletal injury. Will obtain pelvic/left hip x-ray for further evaluation.

## 2021-08-01 ENCOUNTER — Telehealth: Payer: Self-pay | Admitting: Family

## 2021-08-01 DIAGNOSIS — S72002A Fracture of unspecified part of neck of left femur, initial encounter for closed fracture: Secondary | ICD-10-CM

## 2021-08-01 NOTE — Telephone Encounter (Signed)
Reviewed x-ray results with pt last night.  Plan for urgent referral to orthopedics.  I am concerned about femoral neck fracture given her symptoms.

## 2021-08-02 ENCOUNTER — Other Ambulatory Visit: Payer: Self-pay

## 2021-08-02 ENCOUNTER — Ambulatory Visit: Payer: 59 | Admitting: Orthopaedic Surgery

## 2021-08-02 DIAGNOSIS — M25552 Pain in left hip: Secondary | ICD-10-CM | POA: Insufficient documentation

## 2021-08-02 NOTE — Progress Notes (Signed)
Office Visit Note   Patient: Christine Rodgers           Date of Birth: Nov 03, 1960           MRN: [id number] Visit Date: 08/02/2021              Requested by: Debbrah Alar, NP [address] [place],  [place] [address] PCP: Debbrah Alar, NP   Assessment & Plan: Visit Diagnoses:  1. Pain in left hip     Plan: Based on findings impression is left hip sprain/strain.  I recommend over-the-counter medications for symptomatic treatment and relative rest and gradual increase in activity as tolerated based on symptoms.  Clinically it is pretty reassuring that she does not have femoral neck fracture.  I also reviewed the x-rays in detail and I do not see any evidence of fracture.  Follow-Up Instructions: No follow-ups on file.   Orders:  No orders of the defined types were placed in this encounter.  No orders of the defined types were placed in this encounter.     Procedures: No procedures performed   Clinical Data: No additional findings.   Subjective: Chief Complaint  Patient presents with   Left Hip - Injury, Pain    HPI  Christine Rodgers is a 61 year old female here for evaluation of left hip pain.  Had a fall recently onto her left hip when she slipped at home.  She had x-rays in ED which showed a questionable femoral neck fracture.  She was able to weight-bear and ambulate pain has improved.  Denies any groin pain.  Feels more like a soreness.  Review of Systems  Constitutional: Negative.   HENT: Negative.    Eyes: Negative.   Respiratory: Negative.    Cardiovascular: Negative.   Endocrine: Negative.   Musculoskeletal: Negative.   Neurological: Negative.   Hematological: Negative.   Psychiatric/Behavioral: Negative.    All other systems reviewed and are negative.   Objective: Vital Signs: LMP 05/22/2013 Comment: perimenopausal  Physical Exam Vitals and nursing note reviewed.  Constitutional:      Appearance: She is well-developed.  HENT:      Head: Normocephalic and atraumatic.  Pulmonary:     Effort: Pulmonary effort is normal.  Abdominal:     Palpations: Abdomen is soft.  Musculoskeletal:     Cervical back: Neck supple.  Skin:    General: Skin is warm.     Capillary Refill: Capillary refill takes less than 2 seconds.  Neurological:     Mental Status: She is alert and oriented to person, place, and time.  Psychiatric:        Behavior: Behavior normal.        Thought Content: Thought content normal.        Judgment: Judgment normal.    Ortho Exam  Examination of the left hip shows only mild discomfort with terminal external rotation and internal rotation.  I was able to watch her walk in the room and she did this pretty well without any significant pain or discomfort or antalgia.  Lateral hip is nontender.  Specialty Comments:  No specialty comments available.  Imaging: No results found.   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   Palpitations 01/05/2013   Routine general medical examination at a health care facility  01/12/2012   Mole of skin 01/12/2012   Hearing problem 01/12/2012   Knee pain, right 10/29/2011   Hematochezia 10/29/2011   ARTHRITIS 04/17/2010   ALLERGY 04/17/2010   CHICKENPOX, HX OF 04/17/2010   HYPERLIPIDEMIA 02/28/2010   ECZEMA 02/27/2010   CHEST PAIN, ATYPICAL 02/27/2010   Past Medical History:  Diagnosis Date   Acute diverticulitis 07/25/2019   Allergy    Anemia    occasional low hbg   Arthritis    knees   Asthma    Dysrhythmia    Eczema    Family history of anesthesia complication    father    Fibromyalgia    History of chicken pox    Hyperlipidemia    Pneumonia    3-4 years ago    Family History  Problem Relation Age of Onset   Hyperlipidemia Mother    Hyperlipidemia Father        Deceased 19-  sepsis/?pneumonia   Arrhythmia Father    Diabetes Paternal Grandmother    Stroke Paternal Grandmother    Stroke Paternal Grandfather    Ulcerative colitis Daughter    Diabetes Maternal Grandmother    Diabetes Maternal Grandfather    Colon cancer Neg Hx    Stomach cancer Neg Hx     Past Surgical History:  Procedure Laterality Date   BUNIONECTOMY  2008   right foot   CERVICAL BIOPSY     benign   KNEE ARTHROSCOPY Left 01/23/2014   Procedure: ARTHROSCOPY KNEE;  Surgeon: Vickey Huger, MD;  Location: [facility];  Service: Orthopedics;  Laterality: Left;   Social History   Occupational History   Occupation: EXECUTIVE ADMIN ASST    Employer: UNEMPLOYED  Tobacco Use   Smoking status: Former    Packs/day: 0.50    Years: 2.00    Pack years: 1.00    Types: Cigarettes    Quit date: 01/18/2004    Years since quitting: 17.5   Smokeless tobacco: Never  Substance and Sexual Activity   Alcohol use: Yes    Alcohol/week: 7.0 standard drinks    Types: 7 Glasses of wine per week    Comment: occ   Drug use: No   Sexual activity: Yes

## 2021-08-16 ENCOUNTER — Ambulatory Visit: Payer: 59 | Admitting: Family

## 2021-08-16 VITALS — BP 130/90 | HR 81 | Temp 98.0°F | Ht 67.0 in | Wt 254.2 lb

## 2021-08-16 DIAGNOSIS — I10 Essential (primary) hypertension: Secondary | ICD-10-CM | POA: Diagnosis not present

## 2021-08-16 DIAGNOSIS — F32A Depression, unspecified: Secondary | ICD-10-CM | POA: Diagnosis not present

## 2021-08-16 DIAGNOSIS — F410 Panic disorder [episodic paroxysmal anxiety] without agoraphobia: Secondary | ICD-10-CM

## 2021-08-16 LAB — BASIC METABOLIC PANEL
BUN: 17 mg/dL (ref 6–23)
CO2: 29 mEq/L (ref 19–32)
Calcium: 9.5 mg/dL (ref 8.4–10.5)
Chloride: 100 mEq/L (ref 96–112)
Creatinine, Ser: 0.75 mg/dL (ref 0.40–1.20)
GFR: 86.16 mL/min (ref >60.00)
Glucose, Bld: 117 mg/dL — ABNORMAL HIGH (ref 70–99)
Potassium: 4.4 mEq/L (ref 3.5–5.1)
Sodium: 137 mEq/L (ref 135–145)

## 2021-08-16 MED ORDER — SERTRALINE HCL 50 MG PO TABS: ORAL_TABLET | ORAL | 0 refills | Status: DC

## 2021-08-16 NOTE — Patient Instructions (Signed)
Please start zoloft 1/2 tab once daily for 1 week, then increase to a full tab once daily on week two. ?Continue HCTZ once daily. We will also work on getting you into the hypertension clinic. ?

## 2021-08-16 NOTE — Assessment & Plan Note (Signed)
Notes that she is having fewer panic attacks, but she has been more tearful.   ?

## 2021-08-16 NOTE — Assessment & Plan Note (Addendum)
BP still running slightly above goal. She has multiple intolerances.  I am not convinced that her joint pain is due to hctz.  Her K+ could be low. Will start by checking bmet for now.  I will also place a referral to the HTN clinic. She cannot take beta blockers, calcium channel blockers or ACE inhibitors unfortunately.  ?

## 2021-08-16 NOTE — Assessment & Plan Note (Signed)
Uncontrolled. Many years ago, she had one hallucination on prozac.  I think that benefits outweigh risks of trying another SSRI. Will initate  ?

## 2021-08-16 NOTE — Progress Notes (Addendum)
Subjective:   By signing my name below, I, Shehryar Baig, attest that this documentation has been prepared under the direction and in the presence of Debbrah Alar NP. 08/16/2021    Patient ID: Christine Rodgers, female    DOB: April 01, 1961, 61 y.o.   MRN: [id number]  Chief Complaint  Patient presents with   Hypertension    3 m f/u    joint pain in lower body     Makes her more tired. From the time she had started the medication HCTZ    Hypertension  Patient is in today for a office visit.   Joint pain- She complains of joint pain. While moving her pain typically worsens and makes her want to sit. She also complains of muscle pain. She has a history of arthritis but notes that pain starts and improving by itself while she moves. She reports her joint pain has started and worsened while taking 25 mg hydrochlorothiazide daily PO. Her blood pressure is slightly elevated from last visit. She records her blood pressure at home and reports they have read elevated while at home and at work. She was on amlodipine prior and reports having nausea while taking it so she stopped. She was also previously prescribed metoprolol succinate. She experienced hive symptoms while taking it so she stopped.  BP Readings from Last 3 Encounters:  08/16/21 130/90  07/31/21 129/77  07/17/21 110/68   Pulse Readings from Last 3 Encounters:  08/16/21 81  07/31/21 85  07/17/21 93   Depression- She reports seeing a counselor to manage her mood. Her mood has worsened but she is processing it with her counselor. Her joint pain is also worsening her mood. She is interested in taking a low dose medication to manage her depression. She was prescribed 25 mg hydroxyzine PRN to manage her anxiety but reports not taking any. Her anxiety improved as her blood pressure decreased and she experienced less panic attacks. She has not taken any due to not feeling the need to. S Albuterol- She rarely uses albuterol inhaler. She  typically uses it during the cold seasons.    Health Maintenance Due  Topic Date Due   Hepatitis C Screening  Never done   Zoster Vaccines- Shingrix (1 of 2) Never done   PAP SMEAR-Modifier  02/27/2013    Past Medical History:  Diagnosis Date   Acute diverticulitis 07/25/2019   Allergy    Anemia    occasional low hbg   Arthritis    knees   Asthma    Dysrhythmia    Eczema    Family history of anesthesia complication    father    Fibromyalgia    History of chicken pox    Hyperlipidemia    Pneumonia    3-4 years ago    Past Surgical History:  Procedure Laterality Date   BUNIONECTOMY  2008   right foot   CERVICAL BIOPSY     benign   KNEE ARTHROSCOPY Left 01/23/2014   Procedure: ARTHROSCOPY KNEE;  Surgeon: Vickey Huger, MD;  Location: [facility];  Service: Orthopedics;  Laterality: Left;    Family History  Problem Relation Age of Onset   Hyperlipidemia Mother    Hyperlipidemia Father        Deceased 36- sepsis/?pneumonia   Arrhythmia Father    Diabetes Paternal Grandmother    Stroke Paternal Grandmother    Stroke Paternal Grandfather    Ulcerative colitis Daughter    Diabetes Maternal Grandmother    Diabetes  Maternal Grandfather    Colon cancer Neg Hx    Stomach cancer Neg Hx     Social History   Socioeconomic History   Marital status: Married    Spouse name: Not on file   Number of children: 3   Years of education: Not on file   Highest education level: Not on file  Occupational History   Occupation: EXECUTIVE ADMIN ASST    Employer: UNEMPLOYED  Tobacco Use   Smoking status: Former    Packs/day: 0.50    Years: 2.00    Pack years: 1.00    Types: Cigarettes    Quit date: 01/18/2004    Years since quitting: 17.5   Smokeless tobacco: Never  Substance and Sexual Activity   Alcohol use: Yes    Alcohol/week: 7.0 standard drinks    Types: 7 Glasses of wine per week    Comment: occ   Drug use: No   Sexual activity: Yes  Other Topics Concern   Not on file   Social History Narrative   Regular exercise:  NoCaffeine use:  2 mugs of coffee daily   Works as a Building surveyor for city of [employer]      Has 3 children, 2 grown one at home   5 grandchildren   Enjoys walking, kayaking, movie, gardening.   Social Determinants of Health   Financial Resource Strain: Not on file  Food Insecurity: Not on file  Transportation Needs: Not on file  Physical Activity: Not on file  Stress: Not on file  Social Connections: Not on file  Intimate Partner Violence: Not on file    Outpatient Medications Prior to Visit  Medication Sig Dispense Refill   co-enzyme Q-10 30 MG capsule Take 30 mg by mouth daily.     hydrochlorothiazide (HYDRODIURIL) 25 MG tablet Take 1 tablet (25 mg total) by mouth daily. 30 tablet 1   Turmeric 500 MG TABS Take 250 tablets by mouth daily.     albuterol (PROVENTIL HFA;VENTOLIN HFA) 108 (90 Base) MCG/ACT inhaler Inhale 2 puffs into the lungs every 6 (six) hours as needed for wheezing or shortness of breath. 1 Inhaler 2   cholecalciferol (VITAMIN D3) 25 MCG (1000 UNIT) tablet Take 1,000 Units by mouth daily. (Patient not taking: Reported on 08/16/2021)     dicyclomine (BENTYL) 10 MG capsule Take 1 capsule (10 mg total) by mouth 3 (three) times daily between meals as needed for spasms. (Patient not taking: Reported on 08/16/2021) 60 capsule 0   Elderberry 575 MG/5ML SYRP Take 5 mLs by mouth daily. (Patient not taking: Reported on 08/16/2021)     hydrOXYzine (VISTARIL) 25 MG capsule Take 1 capsule (25 mg total) by mouth every 8 (eight) hours as needed. (Patient not taking: Reported on 08/16/2021) 30 capsule 1   ondansetron (ZOFRAN ODT) 4 MG disintegrating tablet Take 1 tablet (4 mg total) by mouth every 8 (eight) hours as needed for nausea or vomiting. (Patient not taking: Reported on 08/16/2021) 20 tablet 0   No facility-administered medications prior to visit.    Allergies  Allergen Reactions   Amlodipine Nausea Only   Aspirin      REACTION: severe nausea   Codeine     REACTION: severe nausea   Doxycycline Hyclate     REACTION: hives, throat swelling   Erythromycin     Rash, gi side effects.     Lisinopril Itching    Cough, congestion, itching, nasal congestion   Metronidazole Hives and Itching  Patient reports 30 minutes after taking itching , blisters and diarrhea occur   Penicillins     REACTION: passed out   Prozac [Fluoxetine]     Had hallucinations   Sulfonamide Derivatives     REACTION: diarrhea, vomitting   Levaquin [Levofloxacin In D5w] Rash    Review of Systems  Musculoskeletal:  Positive for joint pain and myalgias.  Psychiatric/Behavioral:  Positive for depression.       Objective:    Physical Exam Constitutional:      General: She is not in acute distress.    Appearance: Normal appearance. She is not ill-appearing.  HENT:     Head: Normocephalic and atraumatic.     Right Ear: External ear normal.     Left Ear: External ear normal.  Eyes:     Extraocular Movements: Extraocular movements intact.     Pupils: Pupils are equal, round, and reactive to light.  Cardiovascular:     Rate and Rhythm: Normal rate and regular rhythm.     Heart sounds: Normal heart sounds. No murmur heard.   No gallop.  Pulmonary:     Effort: Pulmonary effort is normal. No respiratory distress.     Breath sounds: Normal breath sounds. No wheezing or rales.  Skin:    General: Skin is warm and dry.  Neurological:     Mental Status: She is alert and oriented to person, place, and time.  Psychiatric:        Attention and Perception: Attention and perception normal.        Mood and Affect: Affect is tearful.        Speech: Speech normal.        Behavior: Behavior normal.        Cognition and Memory: Cognition normal.        Judgment: Judgment normal.    BP 130/90    Pulse 81    Temp 98 F (36.7 C) (Oral)    Ht 5\' 7"  (1.702 m)    Wt 254 lb 3.2 oz (115.3 kg)    LMP 05/22/2013 Comment: perimenopausal   SpO2  97%    BMI 39.81 kg/m  Wt Readings from Last 3 Encounters:  08/16/21 254 lb 3.2 oz (115.3 kg)  07/31/21 252 lb (114.3 kg)  07/17/21 253 lb 12.8 oz (115.1 kg)       Assessment & Plan:   Problem List Items Addressed This Visit       Unprioritized   Panic attacks    Notes that she is having fewer panic attacks, but she has been more tearful.        Relevant Medications   sertraline (ZOLOFT) 50 MG tablet   Hypertension - Primary    BP still running slightly above goal. She has multiple intolerances.  I am not convinced that her joint pain is due to hctz.  Her K+ could be low. Will start by checking bmet for now.  I will also place a referral to the HTN clinic. She cannot take beta blockers, calcium channel blockers or ACE inhibitors unfortunately.       Relevant Orders   Basic metabolic panel   Ambulatory referral to [facility] [place]   Depression    Uncontrolled. Many years ago, she had one hallucination on prozac.  I think that benefits outweigh risks of trying another SSRI. Will initate       Relevant Medications   sertraline (ZOLOFT) 50 MG tablet  Meds ordered this encounter  Medications   sertraline (ZOLOFT) 50 MG tablet    Sig: 1/2 tab once daily for 1 week, then increase to a full tab once daily on week two    Dispense:  30 tablet    Refill:  0    Order Specific Question:   Supervising Provider    Answer:   Penni Homans A [[id number]]    I, Nance Pear, NP, personally preformed the services described in this documentation.  All medical record entries made by the scribe were at my direction and in my presence.  I have reviewed the chart and discharge instructions (if applicable) and agree that the record reflects my personal performance and is accurate and complete. 08/16/2021   I,Shehryar Baig,acting as a scribe for Nance Pear, NP.,have documented all relevant documentation on the behalf of Nance Pear,  NP,as directed by  Nance Pear, NP while in the presence of Nance Pear, NP.   Nance Pear, NP

## 2021-09-06 ENCOUNTER — Other Ambulatory Visit: Payer: Self-pay | Admitting: Family

## 2021-09-06 ENCOUNTER — Ambulatory Visit: Payer: 59 | Admitting: Family

## 2021-09-06 ENCOUNTER — Encounter: Payer: Self-pay | Admitting: Family

## 2021-09-06 VITALS — BP 133/88 | HR 74 | Temp 98.7°F | Resp 16 | Wt 254.0 lb

## 2021-09-06 DIAGNOSIS — F32A Depression, unspecified: Secondary | ICD-10-CM | POA: Diagnosis not present

## 2021-09-06 DIAGNOSIS — R739 Hyperglycemia, unspecified: Secondary | ICD-10-CM

## 2021-09-06 DIAGNOSIS — I10 Essential (primary) hypertension: Secondary | ICD-10-CM | POA: Diagnosis not present

## 2021-09-06 DIAGNOSIS — E559 Vitamin D deficiency, unspecified: Secondary | ICD-10-CM

## 2021-09-06 DIAGNOSIS — Z23 Encounter for immunization: Secondary | ICD-10-CM

## 2021-09-06 LAB — HEMOGLOBIN A1C: Hgb A1c MFr Bld: 5.9 % (ref 4.6–6.5)

## 2021-09-06 MED ORDER — SERTRALINE HCL 50 MG PO TABS
50.0000 mg | ORAL_TABLET | Freq: Every day | ORAL | 0 refills | Status: DC
Start: 2021-09-06 — End: 2021-11-17

## 2021-09-06 NOTE — Progress Notes (Addendum)
? ?Subjective:  ? ?By signing my name below, I, [name], attest that this documentation has been prepared under the direction and in the presence of Debbrah Alar NP, 09/06/2021   ? ? Patient ID: Christine Rodgers, female    DOB: 1961-06-15, 61 y.o.   MRN: [id number] ? ?Chief Complaint  ?Patient presents with  ? Hypertension  ?  Here for follow up  ? ? ?HPI ?Patient is in today for an office visit. ? ?Mood -  She is taking 50 MG of Zoloft and found that the medication has been improving her mood. She reports that after taking the medication, her bowel movements increase. The frequency of her bowel movements decreases in the evening. ? ?Blood Pressure - Patient is still taking 25 MG of Hydrocholorodiazine. She notices that occasionally her blood pressure increases when her stress increases.  ?BP Readings from Last 3 Encounters:  ?09/06/21 133/88  ?08/16/21 130/90  ?07/31/21 129/77  ? ?Shingrix - She is requesting the Shingrx vaccine.  ? ?Health Maintenance Due  ?Topic Date Due  ? Hepatitis C Screening  Never done  ? PAP SMEAR-Modifier  02/27/2013  ? ? ?Past Medical History:  ?Diagnosis Date  ? Acute diverticulitis 07/25/2019  ? Allergy   ? Anemia   ? occasional low hbg  ? Arthritis   ? knees  ? Asthma   ? CHICKENPOX, HX OF 04/17/2010  ? Qualifier: Diagnosis of  By: Stanford Breed, MD, Kandyce Rud   ? Dysrhythmia   ? Eczema   ? Family history of anesthesia complication   ? father   ? Fibromyalgia   ? History of chicken pox   ? Hyperlipidemia   ? Pneumonia   ? 3-4 years ago  ? ? ?Past Surgical History:  ?Procedure Laterality Date  ? BUNIONECTOMY  2008  ? right foot  ? CERVICAL BIOPSY    ? benign  ? KNEE ARTHROSCOPY Left 01/23/2014  ? Procedure: ARTHROSCOPY KNEE;  Surgeon: Vickey Huger, MD;  Location: [facility];  Service: Orthopedics;  Laterality: Left;  ? ? ?Family History  ?Problem Relation Age of Onset  ? Hyperlipidemia Mother   ? Hyperlipidemia Father   ?     Deceased 47- sepsis/?pneumonia  ? Arrhythmia Father    ? Diabetes Paternal Grandmother   ? Stroke Paternal Grandmother   ? Stroke Paternal Grandfather   ? Ulcerative colitis Daughter   ? Diabetes Maternal Grandmother   ? Diabetes Maternal Grandfather   ? Colon cancer Neg Hx   ? Stomach cancer Neg Hx   ? ? ?Social History  ? ?Socioeconomic History  ? Marital status: Married  ?  Spouse name: Not on file  ? Number of children: 3  ? Years of education: Not on file  ? Highest education level: Not on file  ?Occupational History  ? Occupation: EXECUTIVE ADMIN AS[employer]  ?  Employer: UNEMPLOYED  ?Tobacco Use  ? Smoking status: Former  ?  Packs/day: 0.50  ?  Years: 2.00  ?  Pack years: 1.00  ?  Types: Cigarettes  ?  Quit date: 01/18/2004  ?  Years since quitting: 17.6  ? Smokeless tobacco: Never  ?Substance and Sexual Activity  ? Alcohol use: Yes  ?  Alcohol/week: 7.0 standard drinks  ?  Types: 7 Glasses of wine per week  ?  Comment: occ  ? Drug use: No  ? Sexual activity: Yes  ?Other Topics Concern  ? Not on file  ?Social History Narrative  ? Regular  exercise:  NoCaffeine use:  2 mugs of coffee daily  ? 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   ? Has 3 children, 2 grown one at home  ? 5 grandchildren  ? Enjoys walking, kayaking, movie, gardening.  ? ?Social Determinants of Health  ? ?Financial Resource Strain: Not on file  ?Food Insecurity: Not on file  ?Transportation Needs: Not on file  ?Physical Activity: Not on file  ?Stress: Not on file  ?Social Connections: Not on file  ?Intimate Partner Violence: Not on file  ? ? ?Outpatient Medications Prior to Visit  ?Medication Sig Dispense Refill  ? cholecalciferol (VITAMIN D3) 25 MCG (1000 UNIT) tablet Take 1,000 Units by mouth daily.    ? co-enzyme Q-10 30 MG capsule Take 30 mg by mouth daily.    ? dicyclomine (BENTYL) 10 MG capsule Take 1 capsule (10 mg total) by mouth 3 (three) times daily between meals as needed for spasms. 60 capsule 0  ? hydrochlorothiazide (HYDRODIURIL) 25 MG tablet Take 1 tablet (25 mg total) by  mouth daily. 30 tablet 1  ? Turmeric 500 MG TABS Take 250 tablets by mouth daily.    ? sertraline (ZOLOFT) 50 MG tablet 1/2 tab once daily for 1 week, then increase to a full tab once daily on week two 30 tablet 0  ? ?No facility-administered medications prior to visit.  ? ? ?Allergies  ?Allergen Reactions  ? Amlodipine Nausea Only  ? Aspirin   ?  REACTION: severe nausea  ? Codeine   ?  REACTION: severe nausea  ? Doxycycline Hyclate   ?  REACTION: hives, throat swelling  ? Erythromycin   ?  Rash, gi side effects.    ? Lisinopril Itching  ?  Cough, congestion, itching, nasal congestion  ? Metronidazole Hives and Itching  ?  Patient reports 30 minutes after taking itching , blisters and diarrhea occur  ? Penicillins   ?  REACTION: passed out  ? Prozac [Fluoxetine]   ?  Had hallucinations  ? Sulfonamide Derivatives   ?  REACTION: diarrhea, vomitting  ? Levaquin [Levofloxacin In D5w] Rash  ? ? ?Review of Systems  ?Gastrointestinal:   ?     (+) Frequent bowel movements  ? ?   ?Objective:  ?  ?Physical Exam ?Constitutional:   ?   General: She is not in acute distress. ?   Appearance: Normal appearance. She is not ill-appearing.  ?HENT:  ?   Head: Normocephalic and atraumatic.  ?   Right Ear: External ear normal.  ?   Left Ear: External ear normal.  ?Eyes:  ?   Extraocular Movements: Extraocular movements intact.  ?   Pupils: Pupils are equal, round, and reactive to light.  ?Cardiovascular:  ?   Rate and Rhythm: Normal rate and regular rhythm.  ?   Heart sounds: Normal heart sounds. No murmur heard. ?  No gallop.  ?Pulmonary:  ?   Effort: Pulmonary effort is normal. No respiratory distress.  ?   Breath sounds: Normal breath sounds. No wheezing or rales.  ?Skin: ?   General: Skin is warm and dry.  ?Neurological:  ?   Mental Status: She is alert and oriented to person, place, and time.  ?Psychiatric:     ?   Mood and Affect: Mood normal.     ?   Behavior: Behavior normal.     ?   Judgment: Judgment normal.  ? ? ?BP 133/88  (BP Location:  Right Arm, Patient Position: Sitting, Cuff Size: Large)   Pulse 74   Temp 98.7 ?F (37.1 ?C) (Oral)   Resp 16   Wt 254 lb (115.2 kg)   LMP 05/22/2013 Comment: perimenopausal  SpO2 97%   BMI 39.78 kg/m?  ?Wt Readings from Last 3 Encounters:  ?09/06/21 254 lb (115.2 kg)  ?08/16/21 254 lb 3.2 oz (115.3 kg)  ?07/31/21 252 lb (114.3 kg)  ? ? ?   ?Assessment & Plan:  ? ?Problem List Items Addressed This Visit   ? ?  ? Unprioritized  ? Hypertension  ?  BP Readings from Last 3 Encounters:  ?09/06/21 133/88  ?08/16/21 130/90  ?07/31/21 129/77  ?BP at goal. Continue hctz.   ?  ?  ? Depression - Primary  ?  Much improved on zoloft. Will continue '50mg'$ . She is having increased frequency of bowel movements and I advised her that this should improve in the coming weeks.  ?  ?  ? Relevant Medications  ? sertraline (ZOLOFT) 50 MG tablet  ? ?Other Visit Diagnoses   ? ? Vitamin D deficiency      ? Relevant Orders  ? Vitamin D (25 hydroxy)  ? Hyperglycemia      ? Relevant Orders  ? Hemoglobin A1c  ? Need for shingles vaccine      ? Relevant Orders  ? Varicella-zoster vaccine IM (Shingrix) (Completed)  ? ?  ? ? ? ? ?Meds ordered this encounter  ?Medications  ? sertraline (ZOLOFT) 50 MG tablet  ?  Sig: Take 1 tablet (50 mg total) by mouth at bedtime.  ?  Dispense:  90 tablet  ?  Refill:  0  ?  Order Specific Question:   Supervising Provider  ?  Answer:   Penni Homans A [[id number]]  ? ? ?I, Nance Pear, NP, personally preformed the services described in this documentation.  All medical record entries made by the scribe were at my direction and in my presence.  I have reviewed the chart and discharge instructions (if applicable) and agree that the record reflects my personal performance and is accurate and complete. 09/06/2021 ? ? ?I,Amber Collins,acting as a Education administrator for [employer], NP.,have documented all relevant documentation on the behalf of Nance Pear, NP,as directed by  Nance Pear, NP while in the presence of Nance Pear, NP. ? ? ? ?Nance Pear, NP ? ?

## 2021-09-06 NOTE — Assessment & Plan Note (Signed)
BP Readings from Last 3 Encounters:  ?09/06/21 133/88  ?08/16/21 130/90  ?07/31/21 129/77  ? ?BP at goal. Continue hctz.   ?

## 2021-09-06 NOTE — Assessment & Plan Note (Signed)
Much improved on zoloft. Will continue '50mg'$ . She is having increased frequency of bowel movements and I advised her that this should improve in the coming weeks.  ?

## 2021-09-10 LAB — VITAMIN D 25 HYDROXY (VIT D DEFICIENCY, FRACTURES): VITD: 26.2 ng/mL — ABNORMAL LOW (ref 30.00–100.00)

## 2021-09-11 ENCOUNTER — Encounter: Payer: Self-pay | Admitting: Cardiovascular Disease

## 2021-09-11 ENCOUNTER — Telehealth: Payer: Self-pay | Admitting: Family

## 2021-09-11 MED ORDER — VITAMIN D3 75 MCG (3000 UT) PO TABS
1.0000 | ORAL_TABLET | Freq: Every day | ORAL | Status: AC
Start: 2021-09-11 — End: ?

## 2021-09-11 NOTE — Telephone Encounter (Signed)
Called but no answer, lvm for patient to call back for results and advise on increasing dose ?

## 2021-09-13 NOTE — Telephone Encounter (Signed)
Called patient and she confirms she received this message ?

## 2021-10-22 ENCOUNTER — Ambulatory Visit: Payer: 59 | Admitting: Family

## 2021-10-22 DIAGNOSIS — J40 Bronchitis, not specified as acute or chronic: Secondary | ICD-10-CM | POA: Diagnosis not present

## 2021-10-22 LAB — POCT INFLUENZA A/B
Influenza A, POC: NEGATIVE
Influenza B, POC: NEGATIVE

## 2021-10-22 MED ORDER — CEFDINIR 300 MG PO CAPS: 300.0000 mg | ORAL_CAPSULE | Freq: Two times a day (BID) | ORAL | 0 refills | Status: DC

## 2021-10-22 MED ORDER — BENZONATATE 100 MG PO CAPS: 100.0000 mg | ORAL_CAPSULE | Freq: Two times a day (BID) | ORAL | 0 refills | Status: DC | PRN

## 2021-10-22 MED ORDER — ALBUTEROL SULFATE HFA 108 (90 BASE) MCG/ACT IN AERS: 2.0000 | INHALATION_SPRAY | Freq: Four times a day (QID) | RESPIRATORY_TRACT | 0 refills | Status: AC | PRN

## 2021-10-22 NOTE — Progress Notes (Signed)
? ?Subjective:  ? ?By signing my name below, I, [name], attest that this documentation has been prepared under the direction and in the presence of [name], NP 10/22/2021   ? ? Patient ID: Christine Rodgers, female    DOB: Jan 10, 1961, 61 y.o.   MRN: [id number] ? ?Chief Complaint  ?Patient presents with  ? Cough  ?  Patient complains of productive cough  ? Fever  ?  Reports of fever that ranges from 100.4  to 102.4  ? Generalized Body Aches  ?  Complains of body aches and headache   ? ? ?HPI ?Patient is in today for an office visit. ? ?Sickness - She complains of body aches, cough, and fever that begun on 10/17/2021. She ran her temperature and saw the reading was 102 F. She took tylenol and fever still went up but then went down. She notes a yellow mucus from coughing. She took two COVID 19 tests and tested negative twice. She reports that her son has been sick for 5 days and believes that she got her symptoms from him. Her son also received a COVID 19 test and was reported to be negative. Using some albuterol that she had on hand at home for wheezing which  has been helpful.  ? ?Health Maintenance Due  ?Topic Date Due  ? Hepatitis C Screening  Never done  ? PAP SMEAR-Modifier  02/27/2013  ? ? ?Past Medical History:  ?Diagnosis Date  ? Acute diverticulitis 07/25/2019  ? Allergy   ? Anemia   ? occasional low hbg  ? Arthritis   ? knees  ? Asthma   ? CHICKENPOX, HX OF 04/17/2010  ? Qualifier: Diagnosis of  By: Stanford Breed, MD, Kandyce Rud   ? Dysrhythmia   ? Eczema   ? Family history of anesthesia complication   ? father   ? Fibromyalgia   ? History of chicken pox   ? Hyperlipidemia   ? Pneumonia   ? 3-4 years ago  ? ? ?Past Surgical History:  ?Procedure Laterality Date  ? BUNIONECTOMY  2008  ? right foot  ? CERVICAL BIOPSY    ? benign  ? KNEE ARTHROSCOPY Left 01/23/2014  ? Procedure: ARTHROSCOPY KNEE;  Surgeon: Vickey Huger, MD;  Location: [facility];  Service: Orthopedics;  Laterality: Left;   ? ? ?Family History  ?Problem Relation Age of Onset  ? Hyperlipidemia Mother   ? Hyperlipidemia Father   ?     Deceased 76- sepsis/?pneumonia  ? Arrhythmia Father   ? Diabetes Paternal Grandmother   ? Stroke Paternal Grandmother   ? Stroke Paternal Grandfather   ? Ulcerative colitis Daughter   ? Diabetes Maternal Grandmother   ? Diabetes Maternal Grandfather   ? Colon cancer Neg Hx   ? Stomach cancer Neg Hx   ? ? ?Social History  ? ?Socioeconomic History  ? Marital status: Married  ?  Spouse name: Not on file  ? Number of children: 3  ? Years of education: Not on file  ? Highest education level: Not on file  ?Occupational History  ? Occupation: EXECUTIVE ADMIN ASST  ?  Employer: UNEMPLOYED  ?Tobacco Use  ? Smoking status: Former  ?  Packs/day: 0.50  ?  Years: 2.00  ?  Pack years: 1.00  ?  Types: Cigarettes  ?  Quit date: 01/18/2004  ?  Years since quitting: 17.7  ? Smokeless tobacco: Never  ?Substance and Sexual Activity  ? Alcohol use: Yes  ?  Alcohol/week: 7.0 standard drinks  ?  Types: 7 Glasses of wine per week  ?  Comment: occ  ? Drug use: No  ? Sexual activity: Yes  ?Other Topics Concern  ? Not on file  ?Social History Narrative  ? Regular exercise:  NoCaffeine use:  2 mugs of coffee daily  ? 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   ? Has 3 children, 2 grown one at home  ? 5 grandchildren  ? Enjoys walking, kayaking, movie, gardening.  ? ?Social Determinants of Health  ? ?Financial Resource Strain: Not on file  ?Food Insecurity: Not on file  ?Transportation Needs: Not on file  ?Physical Activity: Not on file  ?Stress: Not on file  ?Social Connections: Not on file  ?Intimate Partner Violence: Not on file  ? ? ?Outpatient Medications Prior to Visit  ?Medication Sig Dispense Refill  ? Cholecalciferol (VITAMIN D3) 75 MCG (3000 UT) TABS Take 1 tablet by mouth daily. 30 tablet   ? co-enzyme Q-10 30 MG capsule Take 30 mg by mouth daily.    ? dicyclomine (BENTYL) 10 MG capsule Take 1 capsule (10 mg  total) by mouth 3 (three) times daily between meals as needed for spasms. 60 capsule 0  ? hydrochlorothiazide (HYDRODIURIL) 25 MG tablet TAKE 1 TABLET(25 MG) BY MOUTH DAILY 90 tablet 1  ? sertraline (ZOLOFT) 50 MG tablet Take 1 tablet (50 mg total) by mouth at bedtime. 90 tablet 0  ? Turmeric 500 MG TABS Take 250 tablets by mouth daily.    ? ?No facility-administered medications prior to visit.  ? ? ?Allergies  ?Allergen Reactions  ? Amlodipine Nausea Only  ? Aspirin   ?  REACTION: severe nausea  ? Codeine   ?  REACTION: severe nausea  ? Doxycycline Hyclate   ?  REACTION: hives, throat swelling  ? Erythromycin   ?  Rash, gi side effects.    ? Lisinopril Itching  ?  Cough, congestion, itching, nasal congestion  ? Metronidazole Hives and Itching  ?  Patient reports 30 minutes after taking itching , blisters and diarrhea occur  ? Penicillins   ?  REACTION: passed out  ? Prozac [Fluoxetine]   ?  Had hallucinations  ? Sulfonamide Derivatives   ?  REACTION: diarrhea, vomitting  ? Levaquin [Levofloxacin In D5w] Rash  ? ? ?Review of Systems  ?Constitutional:  Positive for fever.  ?Respiratory:  Positive for cough and sputum production (Yellow).   ?Musculoskeletal:  Positive for myalgias.  ? ?   ?Objective:  ?  ?Physical Exam ?Constitutional:   ?   General: She is not in acute distress. ?   Appearance: Normal appearance. She is not ill-appearing.  ?HENT:  ?   Head: Normocephalic and atraumatic.  ?   Right Ear: External ear normal.  ?   Left Ear: External ear normal.  ?   Mouth/Throat:  ?   Pharynx: Posterior oropharyngeal erythema present. No oropharyngeal exudate.  ?Eyes:  ?   Extraocular Movements: Extraocular movements intact.  ?   Pupils: Pupils are equal, round, and reactive to light.  ?Cardiovascular:  ?   Rate and Rhythm: Normal rate and regular rhythm.  ?   Heart sounds: Normal heart sounds. No murmur heard. ?  No gallop.  ?Pulmonary:  ?   Effort: Pulmonary effort is normal. No respiratory distress.  ?   Breath  sounds: Normal breath sounds. No wheezing or rales.  ?Skin: ?   General: Skin  is warm and dry.  ?Neurological:  ?   Mental Status: She is alert and oriented to person, place, and time.  ?Psychiatric:     ?   Mood and Affect: Mood normal.     ?   Behavior: Behavior normal.     ?   Judgment: Judgment normal.  ? ? ?BP 123/68 (BP Location: Right Arm, Patient Position: Sitting, Cuff Size: Large)   Pulse (!) 107   Temp 99.4 ?F (37.4 ?C) (Oral)   Resp 16   Wt 249 lb (112.9 kg)   LMP 05/22/2013 Comment: perimenopausal  SpO2 97%   BMI 39.00 kg/m?  ?Wt Readings from Last 3 Encounters:  ?10/22/21 249 lb (112.9 kg)  ?09/06/21 254 lb (115.2 kg)  ?08/16/21 254 lb 3.2 oz (115.3 kg)  ? ? ?   ?Assessment & Plan:  ? ?Problem List Items Addressed This Visit   ? ?  ? Unprioritized  ? Bronchitis  ?  Rapid flu test negative. Suspect viral illness now moving into bronchitis/early PNA. She declines CXR due to cost. With hx of prolonged QT interval and multiple drug allergies, choices are very limited. Will rx with cefdinir. Refill albuterol for prn use. She is advised to call if symptoms worsen or if symptoms do not improve in the next 3-4 days.  ? ?  ?  ? ? ? ?Meds ordered this encounter  ?Medications  ? cefdinir (OMNICEF) 300 MG capsule  ?  Sig: Take 1 capsule (300 mg total) by mouth 2 (two) times daily.  ?  Dispense:  14 capsule  ?  Refill:  0  ?  Order Specific Question:   Supervising Provider  ?  Answer:   Penni Homans A [[id number]]  ? albuterol (VENTOLIN HFA) 108 (90 Base) MCG/ACT inhaler  ?  Sig: Inhale 2 puffs into the lungs every 6 (six) hours as needed for wheezing or shortness of breath.  ?  Dispense:  8 g  ?  Refill:  0  ?  Order Specific Question:   Supervising Provider  ?  Answer:   Penni Homans A [[id number]]  ? benzonatate (TESSALON) 100 MG capsule  ?  Sig: Take 1 capsule (100 mg total) by mouth 2 (two) times daily as needed for cough.  ?  Dispense:  20 capsule  ?  Refill:  0  ?  Order Specific Question:   Supervising  Provider  ?  Answer:   Penni Homans A [[id number]]  ? ? ?I, Nance Pear, NP, personally preformed the services described in this documentation.  All medical record entries made by the scribe were at my direction and in my presence

## 2021-10-22 NOTE — Addendum Note (Signed)
Addended by: Jiles Prows on: 10/22/2021 02:15 PM ? ? Modules accepted: Orders ? ?

## 2021-10-22 NOTE — Assessment & Plan Note (Signed)
Rapid flu test negative. Suspect viral illness now moving into bronchitis/early PNA. She declines CXR due to cost. With hx of prolonged QT interval and multiple drug allergies, choices are very limited. Will rx with cefdinir. Refill albuterol for prn use. She is advised to call if symptoms worsen or if symptoms do not improve in the next 3-4 days.  ?

## 2021-10-24 ENCOUNTER — Telehealth: Payer: Self-pay | Admitting: Family

## 2021-10-24 DIAGNOSIS — R059 Cough, unspecified: Secondary | ICD-10-CM

## 2021-10-24 NOTE — Telephone Encounter (Signed)
Pt states that at the time of her 05/09 OV she did not want to try the prednisone and just wanted to try her inhaler first. Pt states her symptom have not gotten better and would now like the prednisone sent to her pharmacy. Please advice.  ? ? ?[id number] - [place], [place] - [address]  ?[address], [place] [address]  ?Phone:  [phone]  Fax:  [phone] ?

## 2021-10-24 NOTE — Telephone Encounter (Signed)
Pt states she has been coughing very hard and was coughing up blood last night. She stated she is needing this rx as soon as possible. Did transfer pt to triage to go over sxs with a nurse.  Please advise.  ?

## 2021-10-25 ENCOUNTER — Ambulatory Visit (HOSPITAL_BASED_OUTPATIENT_CLINIC_OR_DEPARTMENT_OTHER)
Admission: RE | Admit: 2021-10-25 | Discharge: 2021-10-25 | Disposition: A | Discharge status: Home / Self Care | Payer: 59 | Source: Ambulatory Visit | Attending: Family | Admitting: Family

## 2021-10-25 DIAGNOSIS — R059 Cough, unspecified: Secondary | ICD-10-CM | POA: Insufficient documentation

## 2021-10-25 MED ORDER — PREDNISONE 10 MG PO TABS: ORAL_TABLET | ORAL | 0 refills | Status: DC

## 2021-10-25 NOTE — Telephone Encounter (Signed)
Nurse Assessment ?Nurse: Lavina Hamman, RN, Thomasena Edis Date/Time (Eastern Time): 10/24/2021 3:50:13 PM ?Confirm and document reason for call. If ?symptomatic, describe symptoms. ?---Caller states she was seen on Tuesday for trouble ?breathing and was prescribed and abx. Started Abx ?yesterday. Now is coughing up blood. ?Does the patient have any new or worsening ?symptoms? ---Yes ?Will a triage be completed? ---Yes ?Related visit to physician within the last 2 weeks? ---Yes ?Does the PT have any chronic conditions? (i.e. ?diabetes, asthma, this includes High risk factors for ?pregnancy, etc.) ?---Yes ?List chronic conditions. ---Asthma, htn ?Is this a behavioral health or substance abuse call? ---No ?Guidelines ?Guideline Title Affirmed Question Affirmed Notes Nurse Date/Time (Eastern ?Time) ?Coughing Up Blood [1] MODERATE ?difficulty breathing ?(e.g., speaks in ?phrases, SOB even at ?rest, pulse 100-120) ?AND [2] still present ?when not coughing ?Lavina Hamman, RN, [place] 10/24/2021 3:51:46 ?PM ?PLEASE NOTE: All timestamps contained within this report are represented as [place] Standard Time. ?CONFIDENTIALTY NOTICE: This fax transmission is intended only for the addressee. It contains information that is legally privileged, confidential or ?otherwise protected from use or disclosure. If you are not the intended recipient, you are strictly prohibited from reviewing, disclosing, copying using ?or disseminating any of this information or taking any action in reliance on or regarding this information. If you have received this fax in error, please ?notify us immediately by telephone so that we can arrange for its return to [place]. Phone: [phone], Toll-Free: [phone], Fax: [phone] ?Page: 2 of 2 ?Call Id: [id number] ?Disp. Time (Eastern ?Time) Disposition Final User ?10/24/2021 3:48:56 PM Send to Urgent Queue [facility] ?10/24/2021 3:55:58 PM Go to ED Now Yes Lavina Hamman, RN, Thomasena Edis ?Caller Disagree/Comply Comply ?Caller Understands  Yes ?PreDisposition InappropriateToAsk ?Care Advice Given Per Guideline ?GO TO ED NOW: * You need to be seen in the Emergency Department. CARE ADVICE given per Coughing Up Blood guideline. ?ANOTHER ADULT SHOULD DRIVE: * It is better and safer if another adult drives instead of you. CALL EMS 911 IF: * Severe ?difficulty breathing * Lips or face turns blue * Passes out or becomes confused. ?Referrals ?[facility] [place] - ED ?

## 2021-10-25 NOTE — Telephone Encounter (Signed)
Rx sent to [facility] for prednisone. I would like for her to complete a chest x-ray as well please since she is coughing up blood to make sure she does not have pneumonia.  ?

## 2021-10-25 NOTE — Telephone Encounter (Signed)
Lvm for patient to call back

## 2021-10-25 NOTE — Telephone Encounter (Signed)
Patient advised of new prescription and chest xray order. She will try to come in this morning for xray. ?

## 2021-11-16 ENCOUNTER — Other Ambulatory Visit: Payer: Self-pay | Admitting: Family

## 2021-11-25 ENCOUNTER — Ambulatory Visit (HOSPITAL_BASED_OUTPATIENT_CLINIC_OR_DEPARTMENT_OTHER): Payer: 59 | Admitting: Cardiovascular Disease

## 2021-11-26 ENCOUNTER — Ambulatory Visit (HOSPITAL_BASED_OUTPATIENT_CLINIC_OR_DEPARTMENT_OTHER): Payer: 59 | Admitting: Cardiovascular Disease

## 2021-12-13 ENCOUNTER — Ambulatory Visit: Payer: 59 | Admitting: Family

## 2021-12-13 VITALS — BP 117/70 | HR 96 | Resp 18 | Ht 67.0 in | Wt 252.4 lb

## 2021-12-13 DIAGNOSIS — E559 Vitamin D deficiency, unspecified: Secondary | ICD-10-CM | POA: Diagnosis not present

## 2021-12-13 DIAGNOSIS — F32A Depression, unspecified: Secondary | ICD-10-CM

## 2021-12-13 DIAGNOSIS — I10 Essential (primary) hypertension: Secondary | ICD-10-CM

## 2021-12-13 DIAGNOSIS — Z23 Encounter for immunization: Secondary | ICD-10-CM

## 2021-12-13 LAB — BASIC METABOLIC PANEL
BUN: 15 mg/dL (ref 6–23)
CO2: 30 mEq/L (ref 19–32)
Calcium: 9.3 mg/dL (ref 8.4–10.5)
Chloride: 102 mEq/L (ref 96–112)
Creatinine, Ser: 0.66 mg/dL (ref 0.40–1.20)
GFR: 94.71 mL/min (ref >60.00)
Glucose, Bld: 114 mg/dL — ABNORMAL HIGH (ref 70–99)
Potassium: 4.3 mEq/L (ref 3.5–5.1)
Sodium: 141 mEq/L (ref 135–145)

## 2021-12-13 LAB — VITAMIN D 25 HYDROXY (VIT D DEFICIENCY, FRACTURES): VITD: 53.03 ng/mL (ref 30.00–100.00)

## 2021-12-13 MED ORDER — SERTRALINE HCL 50 MG PO TABS: ORAL_TABLET | ORAL | 0 refills | Status: DC

## 2021-12-13 MED ORDER — BUPROPION HCL ER (XL) 150 MG PO TB24: 150.0000 mg | ORAL_TABLET | Freq: Every day | ORAL | 1 refills | Status: DC

## 2021-12-13 NOTE — Assessment & Plan Note (Signed)
Check level. Continue vit D 3000 iu daily otc.

## 2021-12-13 NOTE — Assessment & Plan Note (Signed)
BP looks great. Continue hctz. Check follow up bmet. BP Readings from Last 3 Encounters:  12/13/21 117/70  10/22/21 123/68  09/06/21 133/88

## 2021-12-13 NOTE — Progress Notes (Signed)
Subjective:   By signing my name below, I, [name], attest that this documentation has been prepared under the direction and in the presence of Karie Chimera, NP 12/13/2021     Patient ID: Christine Rodgers, female    DOB: 12/28/1960, 61 y.o.   MRN: [id number]  No chief complaint on file.   HPI Patient is in today for an office visit.  Refills: She is requesting a refill of 50 mg of Sertraline.  Mood: She has been going to counseling and discussing how she's feeling. She is currently experiencing sexual side effects. She is interested in taking an alternative or additional medication to improve her symptoms. She reports that as of now, her mood is more controlled since her last office visit on 10/22/2021 . She has previously taken Prozac and reports "feeling like a zombie" while on the medication.   Blood Pressure: As of today's visit, her blood pressure is normal. She is currently taking 25 mg of Hydrochlorothiazide.  BP Readings from Last 3 Encounters:  12/13/21 117/70  10/22/21 123/68  09/06/21 133/88   Pulse Readings from Last 3 Encounters:  12/13/21 96  10/22/21 (!) 107  09/06/21 74   Vitamin D: She is now taking Vitamin D3 supplements. Immunizations: She is now UTD on Shingles vaccine.  Social: She is currently working full time. She reports that her external stressors are still persistent.   Health Maintenance Due  Topic Date Due   Hepatitis C Screening  Never done   PAP SMEAR-Modifier  02/27/2013    Past Medical History:  Diagnosis Date   Acute diverticulitis 07/25/2019   Allergy    Anemia    occasional low hbg   Arthritis    knees   Asthma    CHICKENPOX, HX OF 04/17/2010   Qualifier: Diagnosis of  By: Stanford Breed, MD, Kandyce Rud    Dysrhythmia    Eczema    Family history of anesthesia complication    father    Fibromyalgia    History of chicken pox    Hyperlipidemia    Pneumonia    3-4 years ago    Past Surgical History:  Procedure  Laterality Date   BUNIONECTOMY  2008   right foot   CERVICAL BIOPSY     benign   KNEE ARTHROSCOPY Left 01/23/2014   Procedure: ARTHROSCOPY KNEE;  Surgeon: Vickey Huger, MD;  Location: [facility];  Service: Orthopedics;  Laterality: Left;    Family History  Problem Relation Age of Onset   Hyperlipidemia Mother    Hyperlipidemia Father        Deceased 64- sepsis/?pneumonia   Arrhythmia Father    Diabetes Paternal Grandmother    Stroke Paternal Grandmother    Stroke Paternal Grandfather    Ulcerative colitis Daughter    Diabetes Maternal Grandmother    Diabetes Maternal Grandfather    Colon cancer Neg Hx    Stomach cancer Neg Hx     Social History   Socioeconomic History   Marital status: Married    Spouse name: Not on file   Number of children: 3   Years of education: Not on file   Highest education level: Not on file  Occupational History   Occupation: EXECUTIVE ADMIN [employer]    Employer: UNEMPLOYED  Tobacco Use   Smoking status: Former    Packs/day: 0.50    Years: 2.00    Total pack years: 1.00    Types: Cigarettes    Quit date: 01/18/2004  Years since quitting: 17.9   Smokeless tobacco: Never  Substance and Sexual Activity   Alcohol use: Yes    Alcohol/week: 7.0 standard drinks of alcohol    Types: 7 Glasses of wine per week    Comment: occ   Drug use: No   Sexual activity: Yes  Other Topics Concern   Not on file  Social History Narrative   Regular exercise:  NoCaffeine use:  2 mugs of coffee daily   Works as a Building surveyor for [employer]      Has 3 children, 2 grown one at home   5 grandchildren   Enjoys walking, kayaking, movie, gardening.   Social Determinants of Health   Financial Resource Strain: Not on file  Food Insecurity: Not on file  Transportation Needs: Not on file  Physical Activity: Not on file  Stress: Not on file  Social Connections: Not on file  Intimate Partner Violence: Not on file    Outpatient Medications Prior to Visit   Medication Sig Dispense Refill   albuterol (VENTOLIN HFA) 108 (90 Base) MCG/ACT inhaler Inhale 2 puffs into the lungs every 6 (six) hours as needed for wheezing or shortness of breath. 8 g 0   Cholecalciferol (VITAMIN D3) 75 MCG (3000 UT) TABS Take 1 tablet by mouth daily. 30 tablet    co-enzyme Q-10 30 MG capsule Take 30 mg by mouth daily.     dicyclomine (BENTYL) 10 MG capsule Take 1 capsule (10 mg total) by mouth 3 (three) times daily between meals as needed for spasms. 60 capsule 0   hydrochlorothiazide (HYDRODIURIL) 25 MG tablet TAKE 1 TABLET(25 MG) BY MOUTH DAILY 90 tablet 1   Turmeric 500 MG TABS Take 250 tablets by mouth daily.     benzonatate (TESSALON) 100 MG capsule Take 1 capsule (100 mg total) by mouth 2 (two) times daily as needed for cough. 20 capsule 0   cefdinir (OMNICEF) 300 MG capsule Take 1 capsule (300 mg total) by mouth 2 (two) times daily. 14 capsule 0   predniSONE (DELTASONE) 10 MG tablet 4 tabs by mouth once daily for 2 days, then 3 tabs daily x 2 days, then 2 tabs daily x 2 days, then 1 tab daily x 2 days 20 tablet 0   sertraline (ZOLOFT) 50 MG tablet TAKE 1 TABLET(50 MG) BY MOUTH AT BEDTIME 90 tablet 0   No facility-administered medications prior to visit.    Allergies  Allergen Reactions   Amlodipine Nausea Only   Aspirin     REACTION: severe nausea   Codeine     REACTION: severe nausea   Doxycycline Hyclate     REACTION: hives, throat swelling   Erythromycin     Rash, gi side effects.     Lisinopril Itching    Cough, congestion, itching, nasal congestion   Metronidazole Hives and Itching    Patient reports 30 minutes after taking itching , blisters and diarrhea occur   Penicillins     REACTION: passed out   Prozac [Fluoxetine]     Had hallucinations   Sulfonamide Derivatives     REACTION: diarrhea, vomitting   Levaquin [Levofloxacin In D5w] Rash    ROS See HPI    Objective:    Physical Exam Constitutional:      General: She is not in acute  distress.    Appearance: Normal appearance. She is not ill-appearing.  HENT:     Head: Normocephalic and atraumatic.     Right Ear: External  ear normal.     Left Ear: External ear normal.  Eyes:     Extraocular Movements: Extraocular movements intact.     Pupils: Pupils are equal, round, and reactive to light.  Skin:    General: Skin is warm and dry.  Neurological:     Mental Status: She is alert and oriented to person, place, and time.  Psychiatric:        Mood and Affect: Mood normal.        Behavior: Behavior normal.        Judgment: Judgment normal.     BP 117/70   Pulse 96   Resp 18   Ht '5\' 7"'$  (1.702 m)   Wt 252 lb 6.4 oz (114.5 kg)   LMP 05/19/2013   SpO2 97%   BMI 39.53 kg/m  Wt Readings from Last 3 Encounters:  12/13/21 252 lb 6.4 oz (114.5 kg)  10/22/21 249 lb (112.9 kg)  09/06/21 254 lb (115.2 kg)       Assessment & Plan:   Problem List Items Addressed This Visit       Unprioritized   Vitamin D deficiency    Check level. Continue vit D 3000 iu daily otc.       Relevant Orders   Vitamin D (25 hydroxy)   Hypertension    BP looks great. Continue hctz. Check follow up bmet. BP Readings from Last 3 Encounters:  12/13/21 117/70  10/22/21 123/68  09/06/21 133/88         Relevant Orders   Basic metabolic panel   Depression    Uncontrolled. She is also having sexual side effect from zoloft which are bothersome.  Will add Wellbutrin '150mg'$  xl daily to hopefully help with both side effect and depression.        Relevant Medications   buPROPion (WELLBUTRIN XL) 150 MG 24 hr tablet   sertraline (ZOLOFT) 50 MG tablet   Other Visit Diagnoses     Need for shingles vaccine    -  Primary   Relevant Orders   Varicella-zoster vaccine IM (Shingrix) (Completed)       Meds ordered this encounter  Medications   buPROPion (WELLBUTRIN XL) 150 MG 24 hr tablet    Sig: Take 1 tablet (150 mg total) by mouth daily.    Dispense:  30 tablet    Refill:  1     Order Specific Question:   Supervising Provider    Answer:   Penni Homans A [[id number]]   sertraline (ZOLOFT) 50 MG tablet    Sig: TAKE 1 TABLET(50 MG) BY MOUTH AT BEDTIME    Dispense:  90 tablet    Refill:  0    ZERO refills remain on this prescription. Your patient is requesting advance approval of refills for this medication to [facility]    Order Specific Question:   Supervising Provider    Answer:   Penni Homans A [[id number]]    I, Nance Pear, NP, personally preformed the services described in this documentation.  All medical record entries made by the scribe were at my direction and in my presence.  I have reviewed the chart and discharge instructions (if applicable) and agree that the record reflects my personal performance and is accurate and complete. 12/13/2021   I,Amber Collins,acting as a scribe for Nance Pear, NP.,have documented all relevant documentation on the behalf of Nance Pear, NP,as directed by  Nance Pear, NP while in the presence of  Nance Pear, NP.  Nance Pear, NP

## 2021-12-13 NOTE — Patient Instructions (Signed)
Start wellbutrin 150 mg once daily in the AM. Complete lab work prior to leaving.

## 2021-12-13 NOTE — Assessment & Plan Note (Signed)
Uncontrolled. She is also having sexual side effect from zoloft which are bothersome.  Will add Wellbutrin '150mg'$  xl daily to hopefully help with both side effect and depression.

## 2021-12-18 ENCOUNTER — Telehealth: Payer: Self-pay | Admitting: Family

## 2021-12-18 NOTE — Telephone Encounter (Signed)
Pt called and stated wellbutrin is making her feel "weird". She has noticed uncontrollable crying, feeling of hopelessness, high bp, and chest pain/ panic attacks. Transferred to triage.

## 2021-12-18 NOTE — Telephone Encounter (Signed)
[facility] - Client TELEPHONE ADVICE RECORD AccessNurse Patient Name:Christine Rodgers Chief Complaint CHEST PAIN - pain, pressure, heaviness or tightness Reason for Call Symptomatic / Request for Health Information Initial Comment Caller states the medication is not working for her, blood pressure is high, chest pressure, had 2 panic attacks yesterday, feels like she cannot stop crying. Translation No Nurse Assessment Nurse: Humfleet, RN, Estill Bamberg Date/Time (Eastern Time): 12/18/2021 9:35:14 AM Confirm and document reason for call. If symptomatic, describe symptoms. ---caller states her bp is high and has chest pressure. says she was taking zoloft and then started wellbutrin friday. yesterday had 2 panic attacks. cannot stop crying. Does the patient have any new or worsening symptoms? ---Yes Will a triage be completed? ---Yes Related visit to physician within the last 2 weeks? ---Yes Does the PT have any chronic conditions? (i.e. diabetes, asthma, this includes High risk factors for pregnancy, etc.) ---Yes List chronic conditions. ---HTN on meds, heart palpitations Is this a behavioral health or substance abuse call? ---Yes Are you having any thoughts or feelings of harming or killing yourself or someone else? ---No Are you currently experiencing any physical discomfort that you think may be related to the use of alcohol or other drugs? (use substance abuse or alcohol abuse guidelines. These include withdrawal symptoms) ---No Do you worry that you may be hearing or seeing things that others do not? ---No Do you take medications for your condition(s)? ---Yes PLEASE NOTE: All timestamps contained within this report are represented as [place] Standard Time. CONFIDENTIALTY NOTICE: This fax transmission is intended only for the addressee. It contains information that is legally privileged, confidential or otherwise protected from use or disclosure. If you are not  the intended recipient, you are strictly prohibited from reviewing, disclosing, copying using or disseminating any of this information or taking any action in reliance on or regarding this information. If you have received this fax in error, please notify us immediately by telephone so that we can arrange for its return to [place]. Phone: [phone], Toll-Free: [phone], Fax: [phone] Page: 2 of 2 Call Id: [id number] Guidelines Guideline Title Affirmed Question Affirmed Notes Nurse Date/Time Eilene Ghazi Time) Chest Pain Dizziness or lightheadedness Humfleet, RN, Estill Bamberg 12/18/2021 9:42:28 AM Disp. Time Eilene Ghazi Time) Disposition Final User 12/18/2021 9:34:17 AM Send to Urgent Clarnce Flock 12/18/2021 9:46:29 AM Go to ED Now Yes Humfleet, RN, Estill Bamberg Final Disposition 12/18/2021 9:46:29 AM Go to ED Now Yes Humfleet, RN, Shelly Coss Disagree/Comply Disagree Caller Understands Yes PreDisposition InappropriateToAsk Care Advice Given Per Guideline GO TO ED NOW: * You need to be seen in the Emergency Department. * Go to the ED at ___________ [place] given per Chest Pain (Adult) guideline. Comments User: Rozelle Logan, RN Date/Time Eilene Ghazi Time): 12/18/2021 9:36:55 AM bp normally 117/77 and yesterday was 158/106 when she did not feel well. User: Rozelle Logan, RN Date/Time Eilene Ghazi Time): 12/18/2021 9:50:25 AM tried to call back line- message says call can not be completed as dialed. * PATIENT ASKING FOR NP TO CALL HER BACK. SHE IS WANTING TO KNOW IF SHE SHOULD CONTINUE THE WELLBUTRIN AND ZOLOFT TOGETHER B/C IT IS CAUSING SEVERE SIDE EFFECTS. WORSENING DEPRESSION, INCREASED BP, CHEST TIGHTNESS, DIZZINESS* Referrals GO TO FACILITY REFUSE

## 2021-12-23 ENCOUNTER — Ambulatory Visit (HOSPITAL_BASED_OUTPATIENT_CLINIC_OR_DEPARTMENT_OTHER): Payer: 59 | Admitting: Cardiovascular Disease

## 2022-01-10 ENCOUNTER — Ambulatory Visit: Payer: 59 | Admitting: Family

## 2022-03-04 ENCOUNTER — Other Ambulatory Visit: Payer: Self-pay | Admitting: Family

## 2022-04-30 ENCOUNTER — Other Ambulatory Visit: Payer: Self-pay

## 2022-04-30 DIAGNOSIS — F32A Depression, unspecified: Secondary | ICD-10-CM

## 2022-04-30 DIAGNOSIS — I10 Essential (primary) hypertension: Secondary | ICD-10-CM

## 2022-04-30 MED ORDER — HYDROCHLOROTHIAZIDE 25 MG PO TABS: ORAL_TABLET | ORAL | 1 refills | Status: DC

## 2022-04-30 MED ORDER — SERTRALINE HCL 50 MG PO TABS: ORAL_TABLET | ORAL | 0 refills | Status: DC

## 2022-05-29 ENCOUNTER — Other Ambulatory Visit: Payer: Self-pay

## 2022-05-29 DIAGNOSIS — F32A Depression, unspecified: Secondary | ICD-10-CM

## 2022-05-29 MED ORDER — SERTRALINE HCL 50 MG PO TABS: 50.0000 mg | ORAL_TABLET | Freq: Every day | ORAL | 0 refills | Status: DC

## 2022-06-27 LAB — HM MAMMOGRAPHY

## 2022-07-08 ENCOUNTER — Other Ambulatory Visit: Payer: Self-pay | Admitting: Family

## 2022-07-08 DIAGNOSIS — F32A Depression, unspecified: Secondary | ICD-10-CM

## 2022-10-09 ENCOUNTER — Other Ambulatory Visit: Payer: Self-pay | Admitting: Family

## 2022-10-09 DIAGNOSIS — I10 Essential (primary) hypertension: Secondary | ICD-10-CM

## 2022-10-10 ENCOUNTER — Encounter: Payer: Self-pay | Admitting: *Deleted

## 2023-01-07 ENCOUNTER — Other Ambulatory Visit: Payer: Self-pay | Admitting: Family

## 2023-01-07 DIAGNOSIS — F32A Depression, unspecified: Secondary | ICD-10-CM

## 2023-01-07 DIAGNOSIS — I10 Essential (primary) hypertension: Secondary | ICD-10-CM

## 2023-02-05 ENCOUNTER — Ambulatory Visit: Payer: 59 | Admitting: Family Medicine

## 2023-02-05 ENCOUNTER — Encounter: Payer: Self-pay | Admitting: Family Medicine

## 2023-02-05 VITALS — BP 114/76 | HR 103 | Temp 98.6°F | Ht 67.0 in | Wt 273.0 lb

## 2023-02-05 DIAGNOSIS — M542 Cervicalgia: Secondary | ICD-10-CM

## 2023-02-05 DIAGNOSIS — G8929 Other chronic pain: Secondary | ICD-10-CM

## 2023-02-05 DIAGNOSIS — Z634 Disappearance and death of family member: Secondary | ICD-10-CM | POA: Diagnosis not present

## 2023-02-05 MED ORDER — ALPRAZOLAM 0.25 MG PO TABS: 0.1250 mg | ORAL_TABLET | Freq: Two times a day (BID) | ORAL | 0 refills | Status: DC | PRN

## 2023-02-05 NOTE — Patient Instructions (Addendum)
Aim to do some physical exertion for 150 minutes per week. This is typically divided into 5 days per week, 30 minutes per day. The activity should be enough to get your heart rate up. Anything is better than nothing if you have time constraints.  Continue with the counseling team.   Do not drink alcohol, do any illicit/street drugs, drive or do anything that requires alertness while on this medicine.   Sleep Hygiene Tips: Do not watch TV or look at screens within 1 hour of going to bed. If you do, make sure there is a blue light filter (nighttime mode) involved. Try to go to bed around the same time every night. Wake up at the same time within 1 hour of regular time. Ex: If you wake up at 7 AM for work, do not sleep past 8 AM on days that you don't work. Do not drink alcohol before bedtime. Do not consume caffeine-containing beverages after noon or within 9 hours of intended bedtime. Get regular exercise/physical activity in your life, but not within 2 hours of planned bedtime. Do not take naps.  Do not eat within 2 hours of planned bedtime. Melatonin, 3-5 mg 30-60 minutes before planned bedtime may be helpful.  The bed should be for sleep or sex only. If after 20-30 minutes you are unable to fall asleep, get up and do something relaxing. Do this until you feel ready to go to sleep again.   Heat (pad or rice pillow in microwave) over affected area, 10-15 minutes twice daily.   Ice/cold pack over area for 10-15 min twice daily.  OK to take Tylenol 1000 mg (2 extra strength tabs) or 975 mg (3 regular strength tabs) every 6 hours as needed.  Let us know if you need anything.  EXERCISES RANGE OF MOTION (ROM) AND STRETCHING EXERCISES  These exercises may help you when beginning to rehabilitate your issue. In order to successfully resolve your symptoms, you must improve your posture. These exercises are designed to help reduce the forward-head and rounded-shoulder posture which contributes to  this condition. Your symptoms may resolve with or without further involvement from your physician, physical therapist or athletic trainer. While completing these exercises, remember:  Restoring tissue flexibility helps normal motion to return to the joints. This allows healthier, less painful movement and activity. An effective stretch should be held for at least 20 seconds, although you may need to begin with shorter hold times for comfort. A stretch should never be painful. You should only feel a gentle lengthening or release in the stretched tissue. Do not do any stretch or exercise that you cannot tolerate.  STRETCH- Axial Extensors Lie on your back on the floor. You may bend your knees for comfort. Place a rolled-up hand towel or dish towel, about 2 inches in diameter, under the part of your head that makes contact with the floor. Gently tuck your chin, as if trying to make a "double chin," until you feel a gentle stretch at the base of your head. Hold 15-20 seconds. Repeat 2-3 times. Complete this exercise 1 time per day.   STRETCH - Axial Extension  Stand or sit on a firm surface. Assume a good posture: chest up, shoulders drawn back, abdominal muscles slightly tense, knees unlocked (if standing) and feet hip width apart. Slowly retract your chin so your head slides back and your chin slightly lowers. Continue to look straight ahead. You should feel a gentle stretch in the back of your head. Be  certain not to feel an aggressive stretch since this can cause headaches later. Hold for 15-20 seconds. Repeat 2-3 times. Complete this exercise 1 time per day.  STRETCH - Cervical Side Bend  Stand or sit on a firm surface. Assume a good posture: chest up, shoulders drawn back, abdominal muscles slightly tense, knees unlocked (if standing) and feet hip width apart. Without letting your nose or shoulders move, slowly tip your right / left ear to your shoulder until your feel a gentle stretch in the  muscles on the opposite side of your neck. Hold 15-20 seconds. Repeat 2-3 times. Complete this exercise 1-2 times per day.  STRETCH - Cervical Rotators  Stand or sit on a firm surface. Assume a good posture: chest up, shoulders drawn back, abdominal muscles slightly tense, knees unlocked (if standing) and feet hip width apart. Keeping your eyes level with the ground, slowly turn your head until you feel a gentle stretch along the back and opposite side of your neck. Hold 15-20 seconds. Repeat 2-3 times. Complete this exercise 1-2 times per day.  RANGE OF MOTION - Neck Circles  Stand or sit on a firm surface. Assume a good posture: chest up, shoulders drawn back, abdominal muscles slightly tense, knees unlocked (if standing) and feet hip width apart. Gently roll your head down and around from the back of one shoulder to the back of the other. The motion should never be forced or painful. Repeat the motion 10-20 times, or until you feel the neck muscles relax and loosen. Repeat 2-3 times. Complete the exercise 1-2 times per day. STRENGTHENING EXERCISES - Cervical Strain and Sprain These exercises may help you when beginning to rehabilitate your injury. They may resolve your symptoms with or without further involvement from your physician, physical therapist, or athletic trainer. While completing these exercises, remember:  Muscles can gain both the endurance and the strength needed for everyday activities through controlled exercises. Complete these exercises as instructed by your physician, physical therapist, or athletic trainer. Progress the resistance and repetitions only as guided. You may experience muscle soreness or fatigue, but the pain or discomfort you are trying to eliminate should never worsen during these exercises. If this pain does worsen, stop and make certain you are following the directions exactly. If the pain is still present after adjustments, discontinue the exercise until you  can discuss the trouble with your clinician.  STRENGTH - Cervical Flexors, Isometric Face a wall, standing about 6 inches away. Place a small pillow, a ball about 6-8 inches in diameter, or a folded towel between your forehead and the wall. Slightly tuck your chin and gently push your forehead into the soft object. Push only with mild to moderate intensity, building up tension gradually. Keep your jaw and forehead relaxed. Hold 10 to 20 seconds. Keep your breathing relaxed. Release the tension slowly. Relax your neck muscles completely before you start the next repetition. Repeat 2-3 times. Complete this exercise 1 time per day.  STRENGTH- Cervical Lateral Flexors, Isometric  Stand about 6 inches away from a wall. Place a small pillow, a ball about 6-8 inches in diameter, or a folded towel between the side of your head and the wall. Slightly tuck your chin and gently tilt your head into the soft object. Push only with mild to moderate intensity, building up tension gradually. Keep your jaw and forehead relaxed. Hold 10 to 20 seconds. Keep your breathing relaxed. Release the tension slowly. Relax your neck muscles completely before you start  the next repetition. Repeat 2-3 times. Complete this exercise 1 time per day.  STRENGTH - Cervical Extensors, Isometric  Stand about 6 inches away from a wall. Place a small pillow, a ball about 6-8 inches in diameter, or a folded towel between the back of your head and the wall. Slightly tuck your chin and gently tilt your head back into the soft object. Push only with mild to moderate intensity, building up tension gradually. Keep your jaw and forehead relaxed. Hold 10 to 20 seconds. Keep your breathing relaxed. Release the tension slowly. Relax your neck muscles completely before you start the next repetition. Repeat 2-3 times. Complete this exercise 1 time per day.  POSTURE AND BODY MECHANICS CONSIDERATIONS Keeping correct posture when sitting,  standing or completing your activities will reduce the stress put on different body tissues, allowing injured tissues a chance to heal and limiting painful experiences. The following are general guidelines for improved posture. Your physician or physical therapist will provide you with any instructions specific to your needs. While reading these guidelines, remember: The exercises prescribed by your provider will help you have the flexibility and strength to maintain correct postures. The correct posture provides the optimal environment for your joints to work. All of your joints have less wear and tear when properly supported by a spine with good posture. This means you will experience a healthier, less painful body. Correct posture must be practiced with all of your activities, especially prolonged sitting and standing. Correct posture is as important when doing repetitive low-stress activities (typing) as it is when doing a single heavy-load activity (lifting).  PROLONGED STANDING WHILE SLIGHTLY LEANING FORWARD When completing a task that requires you to lean forward while standing in one place for a long time, place either foot up on a stationary 2- to 4-inch high object to help maintain the best posture. When both feet are on the ground, the low back tends to lose its slight inward curve. If this curve flattens (or becomes too large), then the back and your other joints will experience too much stress, fatigue more quickly, and can cause pain.   RESTING POSITIONS Consider which positions are most painful for you when choosing a resting position. If you have pain with flexion-based activities (sitting, bending, stooping, squatting), choose a position that allows you to rest in a less flexed posture. You would want to avoid curling into a fetal position on your side. If your pain worsens with extension-based activities (prolonged standing, working overhead), avoid resting in an extended position such as  sleeping on your stomach. Most people will find more comfort when they rest with their spine in a more neutral position, neither too rounded nor too arched. Lying on a non-sagging bed on your side with a pillow between your knees, or on your back with a pillow under your knees will often provide some relief. Keep in mind, being in any one position for a prolonged period of time, no matter how correct your posture, can still lead to stiffness.  WALKING Walk with an upright posture. Your ears, shoulders, and hips should all line up. OFFICE WORK When working at a desk, create an environment that supports good, upright posture. Without extra support, muscles fatigue and lead to excessive strain on joints and other tissues.  CHAIR: A chair should be able to slide under your desk when your back makes contact with the back of the chair. This allows you to work closely. The chair's height should allow your  eyes to be level with the upper part of your monitor and your hands to be slightly lower than your elbows. Body position: Your feet should make contact with the floor. If this is not possible, use a foot rest. Keep your ears over your shoulders. This will reduce stress on your neck and low back.

## 2023-02-05 NOTE — Progress Notes (Signed)
Chief Complaint  Patient presents with   son passed away in the last 3 weeks.   Neck Pain    Hit head with a garage door     Subjective: Patient is a 62 y.o. female here for bereavement.  3 weeks ago, the patient walking with her son to find that he shot himself in the head.  He was 62 years old.  She has a mood disorder, currently taking sertraline 50 mg daily.  She reports compliance no adverse effects.  She has had extreme difficulty sleeping and is having flashbacks of the image of when she found him.  Is affecting her ability to work and concentrate.  Her friend gave her some Ativan which gave her pronounced nausea.  Another friend gave her the lowest doses of Xanax.  She took half a tab and was able to sleep for 6 hours.  She is requesting medication like this.  She is following with a therapist.  No thoughts of harming herself or others.  She was initially drinking more alcohol than usual but it did not help her sleep and she was worried about longer-term health consequences.  She did not drink alcohol yesterday and plans to continue this trend.  No other self-medication.  6 months ago, the patient had a whiplash injury with a garage door.  She did not lose consciousness.  She has had neck pain just below her skull for the past 6 months.  Range of motion is slightly limited.  She is hearing cracking/popping when she moves her head.  No new weakness in her upper extremities.  No neurologic signs or symptoms.  Denies any bruising, redness, or swelling.  Past Medical History:  Diagnosis Date   Acute diverticulitis 07/25/2019   Allergy    Anemia    occasional low hbg   Arthritis    knees   Asthma    CHICKENPOX, HX OF 04/17/2010   Qualifier: Diagnosis of  By: Jens Som, MD, Lyn Hollingshead    Dysrhythmia    Eczema    Family history of anesthesia complication    father    Fibromyalgia    History of chicken pox    Hyperlipidemia    Pneumonia    3-4 years ago    Objective: BP  114/76 (BP Location: Left Arm, Patient Position: Sitting, Cuff Size: Large)   Pulse (!) 103   Temp 98.6 F (37 C) (Oral)   Ht 5\' 7"  (1.702 m)   Wt 273 lb (123.8 kg)   LMP 05/19/2013   SpO2 98%   BMI 42.76 kg/m  General: Awake, appears stated age MSK: TTP over the suboccipital triangle, worse on the left.  No midline tenderness or cervical paraspinal musculature TTP.  Normal active/passive flexion/extension.  Limited rotation bilaterally. Lungs: No accessory muscle use Psych: Age appropriate judgment and insight, normal affect  Assessment and Plan: Bereavement - Plan: ALPRAZolam (XANAX) 0.25 MG tablet  Chronic neck pain  New issue.  She is following with the counseling team.  Recommended she exercise by adding light strength training for her upper body.  Recommended exposure to ambient light.  We will send in 0.25 mg tabs of Xanax to take 0.5-1 tabs nightly as needed.  I did iterate that this is not something that we want her to take long-term.  I also emphasized to her to avoid alcohol and/or other mind altering substances while on this medication.  She will continue her Zoloft 50 mg daily.  She will follow-up with  her regular PCP in 3 weeks. Chronic, not controlled.  Heat, ice, Tylenol, stretches and exercises.  If no improvement in the next several weeks, would consider physical therapy.  No exam findings suggestive of nerve involvement. The patient voiced understanding and agreement to the plan.  I spent 32 minutes with the patient discussing the above plans in addition to reviewing her chart on the same day of the visit.  Jilda Roche [place], [place] 02/05/23  4:43 PM

## 2023-03-02 ENCOUNTER — Ambulatory Visit: Payer: 59 | Admitting: Family

## 2023-03-06 ENCOUNTER — Telehealth: Payer: Self-pay | Admitting: Family

## 2023-03-06 ENCOUNTER — Ambulatory Visit: Payer: 59 | Admitting: Family

## 2023-03-06 VITALS — BP 122/68 | HR 97 | Temp 98.7°F | Resp 16 | Wt 274.0 lb

## 2023-03-06 DIAGNOSIS — R0789 Other chest pain: Secondary | ICD-10-CM | POA: Diagnosis not present

## 2023-03-06 DIAGNOSIS — Z23 Encounter for immunization: Secondary | ICD-10-CM

## 2023-03-06 DIAGNOSIS — E559 Vitamin D deficiency, unspecified: Secondary | ICD-10-CM | POA: Diagnosis not present

## 2023-03-06 DIAGNOSIS — I10 Essential (primary) hypertension: Secondary | ICD-10-CM | POA: Diagnosis not present

## 2023-03-06 DIAGNOSIS — Z634 Disappearance and death of family member: Secondary | ICD-10-CM

## 2023-03-06 DIAGNOSIS — R739 Hyperglycemia, unspecified: Secondary | ICD-10-CM | POA: Diagnosis not present

## 2023-03-06 DIAGNOSIS — F32A Depression, unspecified: Secondary | ICD-10-CM

## 2023-03-06 DIAGNOSIS — Z79899 Other long term (current) drug therapy: Secondary | ICD-10-CM

## 2023-03-06 LAB — BASIC METABOLIC PANEL
BUN: 16 mg/dL (ref 6–23)
CO2: 28 mEq/L (ref 19–32)
Calcium: 9.8 mg/dL (ref 8.4–10.5)
Chloride: 100 mEq/L (ref 96–112)
Creatinine, Ser: 0.69 mg/dL (ref 0.40–1.20)
GFR: 92.9 mL/min (ref >60.00)
Glucose, Bld: 123 mg/dL — ABNORMAL HIGH (ref 70–99)
Potassium: 3.5 mEq/L (ref 3.5–5.1)
Sodium: 139 mEq/L (ref 135–145)

## 2023-03-06 LAB — HEMOGLOBIN A1C: Hgb A1c MFr Bld: 6.2 % (ref 4.6–6.5)

## 2023-03-06 LAB — VITAMIN D 25 HYDROXY (VIT D DEFICIENCY, FRACTURES): VITD: 37.45 ng/mL (ref 30.00–100.00)

## 2023-03-06 MED ORDER — SERTRALINE HCL 50 MG PO TABS: 50.0000 mg | ORAL_TABLET | Freq: Every day | ORAL | 0 refills | Status: DC

## 2023-03-06 MED ORDER — ALPRAZOLAM 0.25 MG PO TABS: 0.1250 mg | ORAL_TABLET | Freq: Two times a day (BID) | ORAL | 0 refills | Status: DC | PRN

## 2023-03-06 NOTE — Telephone Encounter (Signed)
Please call [facility] and request copy of pap and mammogram.

## 2023-03-06 NOTE — Assessment & Plan Note (Addendum)
New.  EKG tracing is personally reviewed.  EKG notes NSR.  No acute changes- interpretation limited by artifact. I do suspect that her chest pain is due to stress.  Will refer to cardiology for further evaluation. She is advised to go to the ER if she develops recurrent chest pain that does not resolve with rest/relaxation techniques.

## 2023-03-06 NOTE — Telephone Encounter (Signed)
Electronic request made

## 2023-03-06 NOTE — Assessment & Plan Note (Signed)
BP stable on hydrochlorothiazide and toprol xl, continue same.

## 2023-03-06 NOTE — Progress Notes (Signed)
Subjective:     Patient ID: Christine Rodgers, female    DOB: 04-08-1961, 62 y.o.   MRN: [id number]  Chief Complaint  Patient presents with   Bereavement    Here for follow up    HPI  Discussed the use of AI scribe software for clinical note transcription with the patient, who gave verbal consent to proceed.  History of Present Illness         Patient is a 62 yr old female who presents today for follow up on her acute grief reaction. Approximately 2 months ago, her 85 yr old son committed suicide.  She found him deceased in bed with a gun. Since that time she has really been struggling with her mental health and insomnia.  She is working closely with a therapist and feels that this is very helpful. She is using xanax 0.25mg  1/2-1 tablet at bedtime as needed for insomnia. She also continues sertraline 50mg .   She reports that she has been having episodes of substernal chest pain.  States that this chest pain I reproducible when it occurs.  No chest pain today. Typically happens when she becomes very upset.   HTN- continues hydrochlorothiazide.   Health Maintenance Due  Topic Date Due   Hepatitis C Screening  Never done   Cervical Cancer Screening (HPV/Pap Cotest)  02/28/2015   COVID-19 Vaccine (5 - 2023-24 season) 02/15/2023    Past Medical History:  Diagnosis Date   Acute diverticulitis 07/25/2019   Allergy    Anemia    occasional low hbg   Arthritis    knees   Asthma    CHICKENPOX, HX OF 04/17/2010   Qualifier: Diagnosis of  By: Jens Som, MD, Lyn Hollingshead    Dysrhythmia    Eczema    Family history of anesthesia complication    father    Fibromyalgia    History of chicken pox    Hyperlipidemia    Pneumonia    3-4 years ago    Past Surgical History:  Procedure Laterality Date   BUNIONECTOMY  2008   right foot   CERVICAL BIOPSY     benign   KNEE ARTHROSCOPY Left 01/23/2014   Procedure: ARTHROSCOPY KNEE;  Surgeon: Dannielle Huh, MD;  Location: [facility] OR;   Service: Orthopedics;  Laterality: Left;    Family History  Problem Relation Age of Onset   Hyperlipidemia Mother    Hyperlipidemia Father        Deceased 81- sepsis/?pneumonia   Arrhythmia Father    Diabetes Paternal Grandmother    Stroke Paternal Grandmother    Stroke Paternal Grandfather    Ulcerative colitis Daughter    Diabetes Maternal Grandmother    Diabetes Maternal Grandfather    Colon cancer Neg Hx    Stomach cancer Neg Hx     Social History   Socioeconomic History   Marital status: Married    Spouse name: Not on file   Number of children: 3   Years of education: Not on file   Highest education level: Not on file  Occupational History   Occupation: EXECUTIVE ADMIN [employer]    Employer: UNEMPLOYED  Tobacco Use   Smoking status: Former    Current packs/day: 0.00    Average packs/day: 0.5 packs/day for 2.0 years (1.0 ttl pk-yrs)    Types: Cigarettes    Start date: 01/17/2002    Quit date: 01/18/2004    Years since quitting: 19.1   Smokeless tobacco: Never  Substance and Sexual  Activity   Alcohol use: Yes    Alcohol/week: 7.0 standard drinks of alcohol    Types: 7 Glasses of wine per week    Comment: occ   Drug use: No   Sexual activity: Yes  Other Topics Concern   Not on file  Social History Narrative   Regular exercise:  NoCaffeine use:  2 mugs of coffee daily   Works as a Geneticist, 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   Social Determinants of Health   Financial Resource Strain: Not on file  Food Insecurity: Not on file  Transportation Needs: Not on file  Physical Activity: Not on file  Stress: Not on file  Social Connections: Not on file  Intimate Partner Violence: Not on file    Outpatient Medications Prior to Visit  Medication Sig Dispense Refill   albuterol (VENTOLIN HFA) 108 (90 Base) MCG/ACT inhaler Inhale 2 puffs into the lungs every 6 (six)  hours as needed for wheezing or shortness of breath. 8 g 0   Cholecalciferol (VITAMIN D3) 75 MCG (3000 UT) TABS Take 1 tablet by mouth daily. 30 tablet    co-enzyme Q-10 30 MG capsule Take 30 mg by mouth daily.     dicyclomine (BENTYL) 10 MG capsule Take 1 capsule (10 mg total) by mouth 3 (three) times daily between meals as needed for spasms. 60 capsule 0   hydrochlorothiazide (HYDRODIURIL) 25 MG tablet Take 1 tablet (25 mg total) by mouth daily. 90 tablet 0   Turmeric 500 MG TABS Take 250 tablets by mouth daily.     ALPRAZolam (XANAX) 0.25 MG tablet Take 0.5-1 tablets (0.125-0.25 mg total) by mouth 2 (two) times daily as needed for anxiety. 20 tablet 0   buPROPion (WELLBUTRIN XL) 150 MG 24 hr tablet Take 1 tablet (150 mg total) by mouth daily. 30 tablet 1   sertraline (ZOLOFT) 50 MG tablet Take 1 tablet (50 mg total) by mouth at bedtime. 90 tablet 0   No facility-administered medications prior to visit.    Allergies  Allergen Reactions   Amlodipine Nausea Only   Aspirin     REACTION: severe nausea   Codeine     REACTION: severe nausea   Doxycycline Hyclate     REACTION: hives, throat swelling   Erythromycin     Rash, gi side effects.     Lisinopril Itching    Cough, congestion, itching, nasal congestion   Metronidazole Hives and Itching    Patient reports 30 minutes after taking itching , blisters and diarrhea occur   Penicillins     REACTION: passed out   Prozac [Fluoxetine]     Had hallucinations   Sulfonamide Derivatives     REACTION: diarrhea, vomitting   Wellbutrin [Bupropion]     Worsening depression   Levaquin [Levofloxacin In D5w] Rash    ROS    See HPI Objective:    Physical Exam Constitutional:      General: She is not in acute distress.    Appearance: Normal appearance. She is well-developed.  HENT:     Head: Normocephalic and atraumatic.     Right Ear: External ear normal.     Left Ear: External ear normal.  Eyes:     General: No scleral  icterus. Neck:     Thyroid: No thyromegaly.  Cardiovascular:     Rate and Rhythm: Normal rate  and regular rhythm.     Heart sounds: Normal heart sounds. No murmur heard. Pulmonary:     Effort: Pulmonary effort is normal. No respiratory distress.     Breath sounds: Normal breath sounds. No wheezing.  Musculoskeletal:     Cervical back: Neck supple.  Skin:    General: Skin is warm and dry.  Neurological:     Mental Status: She is alert and oriented to person, place, and time.  Psychiatric:        Mood and Affect: Mood normal.        Behavior: Behavior normal.        Thought Content: Thought content normal.        Judgment: Judgment normal.      BP 122/68   Pulse 97   Temp 98.7 F (37.1 C) (Oral)   Resp 16   Wt 274 lb (124.3 kg)   LMP 05/19/2013   SpO2 97%   BMI 42.91 kg/m  Wt Readings from Last 3 Encounters:  03/06/23 274 lb (124.3 kg)  02/05/23 273 lb (123.8 kg)  12/13/21 252 lb 6.4 oz (114.5 kg)       Assessment & Plan:   Problem List Items Addressed This Visit       Unprioritized   Vitamin D deficiency - Primary   Relevant Orders   Vitamin D (25 hydroxy)   Hypertension    BP stable on hydrochlorothiazide and toprol xl, continue same.       Relevant Orders   Basic Metabolic Panel (BMET)   Depression    She continues to suffer stress related to her grief reaction.  Continue zoloft, counseling xanax prn.  Controlled substance contract is signed today. Will obtain UDS.  She does admit to marijuana use.       Relevant Medications   ALPRAZolam (XANAX) 0.25 MG tablet   sertraline (ZOLOFT) 50 MG tablet   CHEST PAIN, ATYPICAL    New.  EKG tracing is personally reviewed.  EKG notes NSR.  No acute changes- interpretation limited by artifact. I do suspect that her chest pain is due to stress.  Will refer to cardiology for further evaluation. She is advised to go to the ER if she develops recurrent chest pain that does not resolve with rest/relaxation techniques.       Relevant Orders   EKG 12-Lead (Completed)   Other Visit Diagnoses     High risk medication use       Relevant Orders   DRUG MONITORING, PANEL 8 WITH CONFIRMATION, URINE   Hyperglycemia       Relevant Orders   HgB A1c   Bereavement       Relevant Medications   ALPRAZolam (XANAX) 0.25 MG tablet   Atypical chest pain       Relevant Orders   Ambulatory referral to Cardiology   Needs flu shot       Relevant Orders   Flu vaccine trivalent PF, 6mos and older(Flulaval,Afluria,Fluarix,Fluzone) (Completed)       I have discontinued Aliani P. Congrove's buPROPion. I am also having her maintain her co-enzyme Q-10, Turmeric, dicyclomine, Vitamin D3, albuterol, hydrochlorothiazide, ALPRAZolam, and sertraline.  Meds ordered this encounter  Medications   ALPRAZolam (XANAX) 0.25 MG tablet    Sig: Take 0.5-1 tablets (0.125-0.25 mg total) by mouth 2 (two) times daily as needed for anxiety.    Dispense:  30 tablet    Refill:  0    Order Specific Question:   Supervising Provider  Answer:   Danise Edge A [[id number]]   sertraline (ZOLOFT) 50 MG tablet    Sig: Take 1 tablet (50 mg total) by mouth at bedtime.    Dispense:  90 tablet    Refill:  0    Requested drug refills are authorized, however, the patient needs further evaluation and/or laboratory testing before further refills are given. Ask her to make an appointment for this.    Order Specific Question:   Supervising Provider    Answer:   Danise Edge A [[id number]]

## 2023-03-06 NOTE — Assessment & Plan Note (Signed)
She continues to suffer stress related to her grief reaction.  Continue zoloft, counseling xanax prn.  Controlled substance contract is signed today. Will obtain UDS.  She does admit to marijuana use.

## 2023-03-07 LAB — DM TEMPLATE

## 2023-03-07 LAB — DRUG MONITORING, PANEL 8 WITH CONFIRMATION, URINE
6 Acetylmorphine: NEGATIVE ng/mL (ref <10)
Alcohol Metabolites: NEGATIVE ng/mL (ref <500)
Amphetamines: NEGATIVE ng/mL (ref <500)
Benzodiazepines: NEGATIVE ng/mL (ref <100)
Buprenorphine, Urine: NEGATIVE ng/mL (ref <5)
Cocaine Metabolite: NEGATIVE ng/mL (ref <150)
Creatinine: 78.2 mg/dL (ref >=20.0)
MDMA: NEGATIVE ng/mL (ref <500)
Marijuana Metabolite: NEGATIVE ng/mL (ref <20)
Opiates: NEGATIVE ng/mL (ref <100)
Oxidant: NEGATIVE ug/mL (ref <200)
Oxycodone: NEGATIVE ng/mL (ref <100)
pH: 5.8 (ref 4.5–9.0)

## 2023-03-08 ENCOUNTER — Encounter: Payer: Self-pay | Admitting: Family

## 2023-03-08 DIAGNOSIS — R739 Hyperglycemia, unspecified: Secondary | ICD-10-CM | POA: Insufficient documentation

## 2023-03-16 ENCOUNTER — Other Ambulatory Visit: Payer: Self-pay | Admitting: Family

## 2023-03-16 DIAGNOSIS — I10 Essential (primary) hypertension: Secondary | ICD-10-CM

## 2023-05-19 ENCOUNTER — Other Ambulatory Visit: Payer: Self-pay | Admitting: Family

## 2023-05-19 DIAGNOSIS — Z634 Disappearance and death of family member: Secondary | ICD-10-CM

## 2023-06-04 ENCOUNTER — Telehealth: Payer: Self-pay

## 2023-06-04 NOTE — Telephone Encounter (Signed)
Copied from CRM [id number]. Topic: Clinical - Medication Refill >> Jun 04, 2023  1:56 PM Benetta Spar A wrote: Most Recent Primary Care Visit:  Provider: O'SULLIVAN, MELISSA  Department: [facility]  Visit Type: OFFICE VISIT  Date: 03/06/2023  Medication: Paitent would like for Paxlovid to be prescribed.  Has the patient contacted their pharmacy? No (Agent: If no, request that the patient contact the pharmacy for the refill. If patient does not wish to contact the pharmacy document the reason why and proceed with request.) (Agent: If yes, when and what did the pharmacy advise?)  Is this the correct pharmacy for this prescription? Yes If no, delete pharmacy and type the correct one.  This is the patient's preferred pharmacy:  [facility] - [place], [place] - [address] [place] [address] Phone: [phone] Fax: [phone]  [id number] - [place], [place] - [address] [place] [address] Phone: [phone] Fax: [phone]  [facility] [place] [facility] [address] [place] [address] Phone: [phone] Fax: [phone]  [facility] - [place], [place] - [address] [place] [address] Phone: [phone] Fax: [phone]   Has the prescription been filled recently? No  Is the patient out of the medication? Yes  Has the patient been seen for an appointment in the last year OR does the patient have an upcoming appointment? Yes  Can we respond through MyChart? No  Agent: Please be advised that Rx refills may take up to 3 business days. We ask that you follow-up with your pharmacy.

## 2023-06-04 NOTE — Telephone Encounter (Signed)
Pt scheduled w/ PCP tomorrow.

## 2023-06-05 ENCOUNTER — Ambulatory Visit: Payer: 59 | Admitting: Family

## 2023-06-09 ENCOUNTER — Other Ambulatory Visit: Payer: Self-pay | Admitting: Family

## 2023-06-09 DIAGNOSIS — F32A Depression, unspecified: Secondary | ICD-10-CM

## 2023-06-12 ENCOUNTER — Telehealth: Payer: 59 | Admitting: Emergency Medicine

## 2023-06-12 DIAGNOSIS — H6690 Otitis media, unspecified, unspecified ear: Secondary | ICD-10-CM

## 2023-06-12 MED ORDER — AZITHROMYCIN 250 MG PO TABS: ORAL_TABLET | ORAL | 0 refills | Status: DC

## 2023-06-12 NOTE — Progress Notes (Signed)
Virtual Visit Consent   Christine Rodgers, you are scheduled for a virtual visit with a [facility] provider today. Just as with appointments in the office, your consent must be obtained to participate. Your consent will be active for this visit and any virtual visit you may have with one of our providers in the next 365 days. If you have a MyChart account, a copy of this consent can be sent to you electronically.  As this is a virtual visit, video technology does not allow for your provider to perform a traditional examination. This may limit your provider's ability to fully assess your condition. If your provider identifies any concerns that need to be evaluated in person or the need to arrange testing (such as labs, EKG, etc.), we will make arrangements to do so. Although advances in technology are sophisticated, we cannot ensure that it will always work on either your end or our end. If the connection with a video visit is poor, the visit may have to be switched to a telephone visit. With either a video or telephone visit, we are not always able to ensure that we have a secure connection.  By engaging in this virtual visit, you consent to the provision of healthcare and authorize for your insurance to be billed (if applicable) for the services provided during this visit. Depending on your insurance coverage, you may receive a charge related to this service.  I need to obtain your verbal consent now. Are you willing to proceed with your visit today? Christine Rodgers has provided verbal consent on 06/12/2023 for a virtual visit (video or telephone). [name], PA-C  Date: 06/12/2023 10:22 AM  Virtual Visit via Video Note   I, [name], connected with  Christine Rodgers  ([id number], Sep 21, 1960) on 06/12/23 at 10:15 AM EST by a video-enabled telemedicine application and verified that I am speaking with the correct person using two identifiers.  Location: Patient: Virtual Visit Location  Patient: Home Provider: Virtual Visit Location Provider: Home Office   I discussed the limitations of evaluation and management by telemedicine and the availability of in person appointments. The patient expressed understanding and agreed to proceed.    History of Present Illness: Christine Rodgers is a 62 y.o. who identifies as a female who was assigned female at birth, and is being seen today for ear congestion.  Reports recent diagnosis of COVID.  Now testing negative for the past 4 days.  Reports right ear pain and fullness.  States that she is having some symptoms in the left ear.  States that it feels like she is under water.  States that she has tried OTC meds without relief.  States that she is having some ringing in the ears as well.  Has also tried some flonase.  She states that she is concerned she is getting an inner ear infection.  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     Lisinopril Itching    Cough, congestion, itching, nasal congestion   Metronidazole Hives and Itching    Patient reports 30 minutes after taking itching , blisters and diarrhea occur   Penicillins     REACTION: passed out   Prozac [Fluoxetine]     Had hallucinations   Sulfonamide  Derivatives     REACTION: diarrhea, vomitting   Wellbutrin [Bupropion]     Worsening depression   Levaquin [Levofloxacin In D5w] Rash   Medications:  Current Outpatient Medications:    azithromycin (ZITHROMAX) 250 MG tablet, Take 2 tabs today, then take 1 tab daily until gone., Disp: 6 tablet, Rfl: 0   albuterol (VENTOLIN HFA) 108 (90 Base) MCG/ACT inhaler, Inhale 2 puffs into the lungs every 6 (six) hours as needed for wheezing or shortness of breath., Disp: 8 g, Rfl: 0   ALPRAZolam (XANAX) 0.25 MG tablet, TAKE 1/2 TO 1 TABLET(0.125 TO 0.25 MG) BY MOUTH TWICE DAILY AS NEEDED FOR ANXIETY, Disp: 30 tablet, Rfl: 0   Cholecalciferol (VITAMIN D3) 75 MCG (3000 UT) TABS, Take 1 tablet by mouth daily., Disp: 30 tablet, Rfl:    co-enzyme Q-10 30 MG capsule, Take 30 mg by mouth daily., Disp: , Rfl:    dicyclomine (BENTYL) 10 MG capsule, Take 1 capsule (10 mg total) by mouth 3 (three) times daily between meals as needed for spasms., Disp: 60 capsule, Rfl: 0   hydrochlorothiazide (HYDRODIURIL) 25 MG tablet, TAKE 1 TABLET BY MOUTH DAILY, Disp: 90 tablet, Rfl: 1   sertraline (ZOLOFT) 50 MG tablet, TAKE 1 TABLET BY MOUTH AT  BEDTIME, Disp: 90 tablet, Rfl: 3   Turmeric 500 MG TABS, Take 250 tablets by mouth daily., Disp: , Rfl:   Observations/Objective: Patient is well-developed, well-nourished in no acute distress.  Resting comfortably  at home.  Head is normocephalic, atraumatic.  No labored breathing.  Speech is clear and coherent with logical content.  Patient is alert and oriented at baseline.    Assessment and Plan: 1. Acute otitis media, unspecified otitis media type (Primary)  Meds ordered this encounter  Medications   azithromycin (ZITHROMAX) 250 MG tablet    Sig: Take 2 tabs today, then take 1 tab daily until gone.    Dispense:  6 tablet    Refill:  0    Supervising Provider:   Merrilee Jansky [id number]   Patient states she's taken a z-pak before.  We discussed that z-pak isn't the  preferred treatment, but probably the best option given patient's allergies.  Follow Up Instructions: I discussed the assessment and treatment plan with the patient. The patient was provided an opportunity to ask questions and all were answered. The patient agreed with the plan and demonstrated an understanding of the instructions.  A copy of instructions were sent to the patient via MyChart unless otherwise noted below.     The patient was advised to call back or seek an in-person evaluation if the symptoms worsen or if the condition fails to improve as anticipated.    [name], PA-C

## 2023-06-12 NOTE — Patient Instructions (Signed)
  Christine Rodgers, thank you for joining [name], PA-C for today's virtual visit.  While this provider is not your primary care provider (PCP), if your PCP is located in our provider database this encounter information will be shared with them immediately following your visit.   A [facility] MyChart account gives you access to today's visit and all your visits, tests, and labs performed at [facility] " click here if you don't have a [facility] MyChart account or go to [email or web address]  Consent: (Patient) Christine Rodgers provided verbal consent for this virtual visit at the beginning of the encounter.  Current Medications:  Current Outpatient Medications:    azithromycin (ZITHROMAX) 250 MG tablet, Take 2 tabs today, then take 1 tab daily until gone., Disp: 6 tablet, Rfl: 0   albuterol (VENTOLIN HFA) 108 (90 Base) MCG/ACT inhaler, Inhale 2 puffs into the lungs every 6 (six) hours as needed for wheezing or shortness of breath., Disp: 8 g, Rfl: 0   ALPRAZolam (XANAX) 0.25 MG tablet, TAKE 1/2 TO 1 TABLET(0.125 TO 0.25 MG) BY MOUTH TWICE DAILY AS NEEDED FOR ANXIETY, Disp: 30 tablet, Rfl: 0   Cholecalciferol (VITAMIN D3) 75 MCG (3000 UT) TABS, Take 1 tablet by mouth daily., Disp: 30 tablet, Rfl:    co-enzyme Q-10 30 MG capsule, Take 30 mg by mouth daily., Disp: , Rfl:    dicyclomine (BENTYL) 10 MG capsule, Take 1 capsule (10 mg total) by mouth 3 (three) times daily between meals as needed for spasms., Disp: 60 capsule, Rfl: 0   hydrochlorothiazide (HYDRODIURIL) 25 MG tablet, TAKE 1 TABLET BY MOUTH DAILY, Disp: 90 tablet, Rfl: 1   sertraline (ZOLOFT) 50 MG tablet, TAKE 1 TABLET BY MOUTH AT  BEDTIME, Disp: 90 tablet, Rfl: 3   Turmeric 500 MG TABS, Take 250 tablets by mouth daily., Disp: , Rfl:    Medications ordered in this encounter:  Meds ordered this encounter  Medications   azithromycin (ZITHROMAX) 250 MG tablet    Sig: Take 2 tabs today, then take 1 tab daily until  gone.    Dispense:  6 tablet    Refill:  0    Supervising Provider:   Merrilee Jansky [[id number]]     *If you need refills on other medications prior to your next appointment, please contact your pharmacy*  Follow-Up: Call back or seek an in-person evaluation if the symptoms worsen or if the condition fails to improve as anticipated.  [facility] [phone]  Other Instructions   If you have been instructed to have an in-person evaluation today at a local Urgent Care facility, please use the link below. It will take you to a list of all of our available [facility], including address, phone number and hours of operation. Please do not delay care.  [facility]  If you or a family member do not have a primary care provider, use the link below to schedule a visit and establish care. When you choose a [facility] primary care physician or advanced practice provider, you gain a long-term partner in health. Find a Primary Care Provider  Learn more about [facility]'s in-office and virtual care options: [facility] - Get Care Now

## 2023-06-27 ENCOUNTER — Telehealth: Payer: 59 | Admitting: Nurse Practitioner

## 2023-06-27 DIAGNOSIS — U071 COVID-19: Secondary | ICD-10-CM

## 2023-06-27 MED ORDER — NIRMATRELVIR/RITONAVIR (PAXLOVID)TABLET: 3.0000 | ORAL_TABLET | Freq: Two times a day (BID) | ORAL | 0 refills | Status: AC

## 2023-06-27 MED ORDER — PREDNISONE 20 MG PO TABS: 20.0000 mg | ORAL_TABLET | Freq: Every day | ORAL | 0 refills | Status: AC

## 2023-06-27 NOTE — Progress Notes (Signed)
 Virtual Visit Consent   ICIS BUDREAU, you are scheduled for a virtual visit with a [facility] provider today. Just as with appointments in the office, your consent must be obtained to participate. Your consent will be active for this visit and any virtual visit you may have with one of our providers in the next 365 days. If you have a MyChart account, a copy of this consent can be sent to you electronically.  As this is a virtual visit, video technology does not allow for your provider to perform a traditional examination. This may limit your provider's ability to fully assess your condition. If your provider identifies any concerns that need to be evaluated in person or the need to arrange testing (such as labs, EKG, etc.), we will make arrangements to do so. Although advances in technology are sophisticated, we cannot ensure that it will always work on either your end or our end. If the connection with a video visit is poor, the visit may have to be switched to a telephone visit. With either a video or telephone visit, we are not always able to ensure that we have a secure connection.  By engaging in this virtual visit, you consent to the provision of healthcare and authorize for your insurance to be billed (if applicable) for the services provided during this visit. Depending on your insurance coverage, you may receive a charge related to this service.  I need to obtain your verbal consent now. Are you willing to proceed with your visit today? Christine Rodgers has provided verbal consent on 06/27/2023 for a virtual visit (video or telephone). Christine LELON Servant, NP  Date: 06/27/2023 5:00 PM  Virtual Visit via Video Note   I, Christine Rodgers, connected with  Christine Rodgers  ([id number], [birth year]) on 06/27/23 at  5:00 PM EST by a video-enabled telemedicine application and verified that I am speaking with the correct person using two identifiers.  Location: Patient: Virtual Visit Location Patient:  Home Provider: Virtual Visit Location Provider: Home Office   I discussed the limitations of evaluation and management by telemedicine and the availability of in person appointments. The patient expressed understanding and agreed to proceed.    History of Present Illness: Christine Rodgers is a 63 y.o. who identifies as a female who was assigned female at birth, and is being seen today for COVID positive.  Christine Rodgers Tested positive for covid today. She has been traveling out of town recently. Symptoms started 2 days ago and currently include persistent dry cough, fever with Tmax 102, dizziness, chest congestion, body aches, facial pressure and loose stools.  She is taking Tylenol  for the fever with last recorded temperature 101.2.     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     Lisinopril  Itching    Cough, congestion, itching, nasal congestion   Metronidazole  Hives and Itching    Patient reports 30 minutes after taking itching , blisters and diarrhea occur   Penicillins     REACTION: passed out   Prozac [Fluoxetine]     Had hallucinations   Sulfonamide Derivatives     REACTION: diarrhea, vomitting   Wellbutrin  [Bupropion ]     Worsening depression   Levaquin   [Levofloxacin  In D5w] Rash   Medications:  Current Outpatient Medications:    nirmatrelvir /ritonavir  (PAXLOVID ) 20 x 150 MG & 10 x 100MG  TABS, Take 3 tablets by mouth 2 (two) times daily for 5 days. (Take nirmatrelvir  150 mg two tablets twice daily for 5 days and ritonavir  100 mg one tablet twice daily for 5 days) Patient GFR is 92.90, Disp: 30 tablet, Rfl: 0   predniSONE  (DELTASONE ) 20 MG tablet, Take 1 tablet (20 mg total) by mouth daily with breakfast for 5 days., Disp: 5 tablet, Rfl: 0   albuterol  (VENTOLIN  HFA) 108 (90 Base) MCG/ACT inhaler, Inhale 2 puffs into the lungs every 6 (six) hours as needed for wheezing or shortness of breath., Disp: 8 g, Rfl: 0   ALPRAZolam  (XANAX ) 0.25 MG tablet, TAKE 1/2 TO 1 TABLET(0.125 TO 0.25 MG) BY MOUTH TWICE DAILY AS NEEDED FOR ANXIETY, Disp: 30 tablet, Rfl: 0   azithromycin  (ZITHROMAX ) 250 MG tablet, Take 2 tabs today, then take 1 tab daily until gone., Disp: 6 tablet, Rfl: 0   Cholecalciferol (VITAMIN D3) 75 MCG (3000 UT) TABS, Take 1 tablet by mouth daily., Disp: 30 tablet, Rfl:    co-enzyme Q-10 30 MG capsule, Take 30 mg by mouth daily., Disp: , Rfl:    dicyclomine  (BENTYL ) 10 MG capsule, Take 1 capsule (10 mg total) by mouth 3 (three) times daily between meals as needed for spasms., Disp: 60 capsule, Rfl: 0   hydrochlorothiazide  (HYDRODIURIL ) 25 MG tablet, TAKE 1 TABLET BY MOUTH DAILY, Disp: 90 tablet, Rfl: 1   sertraline  (ZOLOFT ) 50 MG tablet, TAKE 1 TABLET BY MOUTH AT  BEDTIME, Disp: 90 tablet, Rfl: 3   Turmeric 500 MG TABS, Take 250 tablets by mouth daily., Disp: , Rfl:   Observations/Objective: Patient is well-developed, well-nourished in no acute distress.  Resting comfortably at home.  Head is normocephalic, atraumatic.  No labored breathing.  Speech is clear and coherent with logical content.  Patient is alert and oriented at baseline.    Assessment and Plan: 1. Positive self-administered antigen test for COVID-19 (Primary) -  nirmatrelvir /ritonavir  (PAXLOVID ) 20 x 150 MG & 10 x 100MG  TABS; Take 3 tablets by mouth 2 (two) times daily for 5 days. (Take nirmatrelvir  150 mg two tablets twice daily for 5 days and ritonavir  100 mg one tablet twice daily for 5 days) Patient GFR is 92.90  Dispense: 30 tablet; Refill: 0 - predniSONE  (DELTASONE ) 20 MG tablet; Take 1 tablet (20 mg total) by mouth daily with breakfast for 5 days.  Dispense: 5 tablet; Refill: 0   Please keep well-hydrated and get plenty of rest. Start a saline nasal rinse to flush out your nasal passages. You can use plain Mucinex to help thin congestion. If you have a humidifier, you can use this daily as needed.    You are to wear a mask for 5 days from onset of your symptoms.  After day 5, if you have had no fever  and you are feeling better with NO symptoms, you can end masking. Keep in mind you can be contagious 10 days from the onset of symptoms  After day 5 if you have a fever or are having significant symptoms, please wear your mask for full 10 days.   If you note any worsening of symptoms, any significant shortness of breath or any chest pain, please seek ER evaluation ASAP.  Please do not delay care!    If you note any worsening of symptoms, any significant shortness of breath or any chest pain, please seek ER evaluation ASAP.  Please do not delay care!   Follow Up Instructions: I discussed the assessment and treatment plan with the patient. The patient was provided an opportunity to ask questions and all were answered. The patient agreed with the plan and demonstrated an understanding of the instructions.  A copy of instructions were sent to the patient via MyChart unless otherwise noted below.    The patient was advised to call back or seek an in-person evaluation if the symptoms worsen or if the condition fails to improve as anticipated.    Naomi Castrogiovanni W Tiarna Koppen, NP

## 2023-06-27 NOTE — Patient Instructions (Signed)
 Christine Rodgers, thank you for joining Haze LELON Servant, NP for today's virtual visit.  While this provider is not your primary care provider (PCP), if your PCP is located in our provider database this encounter information will be shared with them immediately following your visit.   A [facility] MyChart account gives you access to today's visit and all your visits, tests, and labs performed at [facility]  click here if you don't have a [facility] MyChart account or go to [email or web address]  Consent: (Patient) Christine Rodgers provided verbal consent for this virtual visit at the beginning of the encounter.  Current Medications:  Current Outpatient Medications:    nirmatrelvir /ritonavir  (PAXLOVID ) 20 x 150 MG & 10 x 100MG  TABS, Take 3 tablets by mouth 2 (two) times daily for 5 days. (Take nirmatrelvir  150 mg two tablets twice daily for 5 days and ritonavir  100 mg one tablet twice daily for 5 days) Patient GFR is 92.90, Disp: 30 tablet, Rfl: 0   predniSONE  (DELTASONE ) 20 MG tablet, Take 1 tablet (20 mg total) by mouth daily with breakfast for 5 days., Disp: 5 tablet, Rfl: 0   albuterol  (VENTOLIN  HFA) 108 (90 Base) MCG/ACT inhaler, Inhale 2 puffs into the lungs every 6 (six) hours as needed for wheezing or shortness of breath., Disp: 8 g, Rfl: 0   ALPRAZolam  (XANAX ) 0.25 MG tablet, TAKE 1/2 TO 1 TABLET(0.125 TO 0.25 MG) BY MOUTH TWICE DAILY AS NEEDED FOR ANXIETY, Disp: 30 tablet, Rfl: 0   azithromycin  (ZITHROMAX ) 250 MG tablet, Take 2 tabs today, then take 1 tab daily until gone., Disp: 6 tablet, Rfl: 0   Cholecalciferol (VITAMIN D3) 75 MCG (3000 UT) TABS, Take 1 tablet by mouth daily., Disp: 30 tablet, Rfl:    co-enzyme Q-10 30 MG capsule, Take 30 mg by mouth daily., Disp: , Rfl:    dicyclomine  (BENTYL ) 10 MG capsule, Take 1 capsule (10 mg total) by mouth 3 (three) times daily between meals as needed for spasms., Disp: 60 capsule, Rfl: 0   hydrochlorothiazide  (HYDRODIURIL ) 25  MG tablet, TAKE 1 TABLET BY MOUTH DAILY, Disp: 90 tablet, Rfl: 1   sertraline  (ZOLOFT ) 50 MG tablet, TAKE 1 TABLET BY MOUTH AT  BEDTIME, Disp: 90 tablet, Rfl: 3   Turmeric 500 MG TABS, Take 250 tablets by mouth daily., Disp: , Rfl:    Medications ordered in this encounter:  Meds ordered this encounter  Medications   nirmatrelvir /ritonavir  (PAXLOVID ) 20 x 150 MG & 10 x 100MG  TABS    Sig: Take 3 tablets by mouth 2 (two) times daily for 5 days. (Take nirmatrelvir  150 mg two tablets twice daily for 5 days and ritonavir  100 mg one tablet twice daily for 5 days) Patient GFR is 92.90    Dispense:  30 tablet    Refill:  0    Supervising Provider:   BLAISE ALEENE KIDD [[id number]]   predniSONE  (DELTASONE ) 20 MG tablet    Sig: Take 1 tablet (20 mg total) by mouth daily with breakfast for 5 days.    Dispense:  5 tablet    Refill:  0    Supervising Provider:   BLAISE ALEENE KIDD [[id number]]     *If you need refills on other medications prior to your next appointment, please contact your pharmacy*  Follow-Up: Call back or seek an in-person evaluation if the symptoms worsen or if the condition fails to improve as anticipated.  [facility] [phone]  Other Instructions  Please keep  well-hydrated and get plenty of rest. Start a saline nasal rinse to flush out your nasal passages. You can use plain Mucinex to help thin congestion. If you have a humidifier, you can use this daily as needed.    You are to wear a mask for 5 days from onset of your symptoms.  After day 5, if you have had no fever and you are feeling better with NO symptoms, you can end masking. Keep in mind you can be contagious 10 days from the onset of symptoms  After day 5 if you have a fever or are having significant symptoms, please wear your mask for full 10 days.   If you note any worsening of symptoms, any significant shortness of breath or any chest pain, please seek ER evaluation ASAP.  Please do not delay care!     If you note any worsening of symptoms, any significant shortness of breath or any chest pain, please seek ER evaluation ASAP.  Please do not delay care!    If you have been instructed to have an in-person evaluation today at a local Urgent Care facility, please use the link below. It will take you to a list of all of our available [facility], including address, phone number and hours of operation. Please do not delay care.  [facility]  If you or a family member do not have a primary care provider, use the link below to schedule a visit and establish care. When you choose a [facility] primary care physician or advanced practice provider, you gain a long-term partner in health. Find a Primary Care Provider  Learn more about [facility]'s in-office and virtual care options: [facility] - Get Care Now

## 2023-07-03 ENCOUNTER — Ambulatory Visit: Payer: 59 | Admitting: Family

## 2023-07-03 ENCOUNTER — Telehealth: Payer: Self-pay

## 2023-07-03 ENCOUNTER — Encounter: Payer: Self-pay | Admitting: Family

## 2023-07-03 ENCOUNTER — Ambulatory Visit (INDEPENDENT_AMBULATORY_CARE_PROVIDER_SITE_OTHER): Payer: 59 | Admitting: Family

## 2023-07-03 VITALS — BP 137/71 | HR 74 | Temp 98.7°F | Resp 16 | Ht 67.0 in | Wt 272.0 lb

## 2023-07-03 DIAGNOSIS — M170 Bilateral primary osteoarthritis of knee: Secondary | ICD-10-CM | POA: Diagnosis not present

## 2023-07-03 DIAGNOSIS — F32A Depression, unspecified: Secondary | ICD-10-CM

## 2023-07-03 DIAGNOSIS — I10 Essential (primary) hypertension: Secondary | ICD-10-CM

## 2023-07-03 DIAGNOSIS — R739 Hyperglycemia, unspecified: Secondary | ICD-10-CM | POA: Diagnosis not present

## 2023-07-03 DIAGNOSIS — Z634 Disappearance and death of family member: Secondary | ICD-10-CM

## 2023-07-03 DIAGNOSIS — Z6841 Body Mass Index (BMI) 40.0 and over, adult: Secondary | ICD-10-CM

## 2023-07-03 DIAGNOSIS — M179 Osteoarthritis of knee, unspecified: Secondary | ICD-10-CM | POA: Insufficient documentation

## 2023-07-03 DIAGNOSIS — Z6837 Body mass index (BMI) 37.0-37.9, adult: Secondary | ICD-10-CM | POA: Insufficient documentation

## 2023-07-03 LAB — HEMOGLOBIN A1C: Hgb A1c MFr Bld: 6.4 % (ref 4.6–6.5)

## 2023-07-03 MED ORDER — ALPRAZOLAM 0.25 MG PO TABS: ORAL_TABLET | ORAL | 0 refills | Status: DC

## 2023-07-03 MED ORDER — ZEPBOUND 2.5 MG/0.5ML ~~LOC~~ SOAJ: 2.5000 mg | SUBCUTANEOUS | 0 refills | Status: DC

## 2023-07-03 NOTE — Telephone Encounter (Signed)
PA approved.   Request Reference Number: [id number]. ZEPBOUND INJ 2.5MG  is approved through 12/31/2023. Your patient may now fill this prescription and it will be covered. Authorization Expiration Date: 12/31/2023

## 2023-07-03 NOTE — Assessment & Plan Note (Signed)
BP at goal on hydrochlorothiazide.

## 2023-07-03 NOTE — Assessment & Plan Note (Signed)
Some improvement following gel injections.

## 2023-07-03 NOTE — Assessment & Plan Note (Signed)
Overall doing well considering her recent loss of her son and husband's illness. Patient is currently seeing a counselor weekly and taking Zoloft and Xanax as needed. Xanax is taken as half a tablet at night before bed and occasionally during the day as advised by her counselor. -Continue current medications. -Send refill for Xanax to [facility].Contract up to date.

## 2023-07-03 NOTE — Assessment & Plan Note (Signed)
>>  ASSESSMENT AND PLAN FOR MORBID OBESITY WITH BODY MASS INDEX (BMI) OF 40.0 TO 44.9 IN ADULT (HCC) WRITTEN ON 07/03/2023  2:23 PM BY O'SULLIVAN, Elizah Mierzwa, NP  She denies family hx of thyroid CA.  Discussed the potential side effects, she would like to proceed. Will initiate Zepbound .

## 2023-07-03 NOTE — Assessment & Plan Note (Signed)
Lab Results  Component Value Date   HGBA1C 6.2 03/06/2023   Update A1C.

## 2023-07-03 NOTE — Telephone Encounter (Signed)
PA initiated via Covermymeds; KEY: [id number]. Awaiting determination.

## 2023-07-03 NOTE — Progress Notes (Signed)
Subjective:     Patient ID: Christine Rodgers, female    DOB: [birth year], 63 y.o.   MRN: [id number]  Chief Complaint  Patient presents with   Hypertension    Here for follow up   Depression    Here for follow up   Weight Management Screening    Will like to try medication for weight loss    HPI  Discussed the use of AI scribe software for clinical note transcription with the patient, who gave verbal consent to proceed.  History of Present Illness   The patient, with a history of stress and anxiety, presents with ongoing weight issues and knee pain. The patient has been dealing with significant personal stress due to the recent death of her son and her husband's ongoing battle with prostate cancer. The patient's stress and anxiety are currently managed with Zoloft and Xanax, which she reports as being effective. The patient also reports knee pain, which has been treated with gel injections. The patient reports that the injections took about three weeks to provide relief, but she is now noting improvement. The patient's weight issues have been a concern, and she expresses a desire to lose weight to alleviate the pressure on her knees. The patient is considering  Zepbound to assist with weight loss.      Health Maintenance Due  Topic Date Due   Pneumococcal Vaccine 53-76 Years old (1 of 2 - PCV) Never done   Hepatitis C Screening  Never done   COVID-19 Vaccine (5 - 2024-25 season) 02/15/2023    Past Medical History:  Diagnosis Date   Acute diverticulitis 07/25/2019   Allergy    Anemia    occasional low hbg   Arthritis    knees   Asthma    CHICKENPOX, HX OF 04/17/2010   Qualifier: Diagnosis of  By: Jens Som, MD, Lyn Hollingshead    Dysrhythmia    Eczema    Family history of anesthesia complication    father    Fibromyalgia    History of chicken pox    Hyperglycemia    Hyperlipidemia    Pneumonia    3-4 years ago    Past Surgical History:  Procedure Laterality  Date   BUNIONECTOMY  2008   right foot   CERVICAL BIOPSY     benign   KNEE ARTHROSCOPY Left 01/23/2014   Procedure: ARTHROSCOPY KNEE;  Surgeon: Dannielle Huh, MD;  Location: [facility] OR;  Service: Orthopedics;  Laterality: Left;    Family History  Problem Relation Age of Onset   Hyperlipidemia Mother    Hyperlipidemia Father        Deceased 57- sepsis/?pneumonia   Arrhythmia Father    Diabetes Paternal Grandmother    Stroke Paternal Grandmother    Stroke Paternal Grandfather    Ulcerative colitis Daughter    Diabetes Maternal Grandmother    Diabetes Maternal Grandfather    Colon cancer Neg Hx    Stomach cancer Neg Hx     Social History   Socioeconomic History   Marital status: Married    Spouse name: Not on file   Number of children: 3   Years of education: Not on file   Highest education level: Not on file  Occupational History   Occupation: EXECUTIVE ADMIN ASST    Employer: UNEMPLOYED  Tobacco Use   Smoking status: Former    Current packs/day: 0.00    Average packs/day: 0.5 packs/day for 2.0 years (1.0 ttl pk-yrs)  Types: Cigarettes    Start date: 01/17/2002    Quit date: 01/18/2004    Years since quitting: 19.4   Smokeless tobacco: Never  Substance and Sexual Activity   Alcohol use: Yes    Alcohol/week: 7.0 standard drinks of alcohol    Types: 7 Glasses of wine per week    Comment: occ   Drug use: No   Sexual activity: Yes  Other Topics Concern   Not on file  Social History Narrative   Regular exercise:  NoCaffeine use:  2 mugs of coffee daily   Works as a Geneticist, 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   Married- husband has cancer (metastatic Prostate cancer)   Social Drivers of Corporate investment banker Strain: Not on file  Food Insecurity: Not on file  Transportation Needs: Not on file  Physical Activity: Not on file  Stress: Not on file  Social  Connections: Not on file  Intimate Partner Violence: Not on file    Outpatient Medications Prior to Visit  Medication Sig Dispense Refill   albuterol (VENTOLIN HFA) 108 (90 Base) MCG/ACT inhaler Inhale 2 puffs into the lungs every 6 (six) hours as needed for wheezing or shortness of breath. 8 g 0   Cholecalciferol (VITAMIN D3) 75 MCG (3000 UT) TABS Take 1 tablet by mouth daily. 30 tablet    co-enzyme Q-10 30 MG capsule Take 30 mg by mouth daily.     dicyclomine (BENTYL) 10 MG capsule Take 1 capsule (10 mg total) by mouth 3 (three) times daily between meals as needed for spasms. 60 capsule 0   hydrochlorothiazide (HYDRODIURIL) 25 MG tablet TAKE 1 TABLET BY MOUTH DAILY 90 tablet 1   sertraline (ZOLOFT) 50 MG tablet TAKE 1 TABLET BY MOUTH AT  BEDTIME 90 tablet 3   Turmeric 500 MG TABS Take 250 tablets by mouth daily.     ALPRAZolam (XANAX) 0.25 MG tablet TAKE 1/2 TO 1 TABLET(0.125 TO 0.25 MG) BY MOUTH TWICE DAILY AS NEEDED FOR ANXIETY 30 tablet 0   azithromycin (ZITHROMAX) 250 MG tablet Take 2 tabs today, then take 1 tab daily until gone. 6 tablet 0   No facility-administered medications prior to visit.    Allergies  Allergen Reactions   Amlodipine Nausea Only   Aspirin     REACTION: severe nausea   Codeine     REACTION: severe nausea   Doxycycline Hyclate     REACTION: hives, throat swelling   Erythromycin     Rash, gi side effects.     Lisinopril Itching    Cough, congestion, itching, nasal congestion   Metronidazole Hives and Itching    Patient reports 30 minutes after taking itching , blisters and diarrhea occur   Penicillins     REACTION: passed out   Prozac [Fluoxetine]     Had hallucinations   Sulfonamide Derivatives     REACTION: diarrhea, vomitting   Wellbutrin [Bupropion]     Worsening depression   Levaquin [Levofloxacin In D5w] Rash    ROS See HPI    Objective:    Physical Exam Constitutional:      General: She is not in acute distress.    Appearance:  Normal appearance. She is well-developed.  HENT:     Head: Normocephalic and atraumatic.     Right Ear: External ear normal.     Left  Ear: External ear normal.  Eyes:     General: No scleral icterus. Neck:     Thyroid: No thyromegaly.  Cardiovascular:     Rate and Rhythm: Normal rate and regular rhythm.     Heart sounds: Normal heart sounds. No murmur heard. Pulmonary:     Effort: Pulmonary effort is normal. No respiratory distress.     Breath sounds: Normal breath sounds. No wheezing.  Musculoskeletal:     Cervical back: Neck supple.  Skin:    General: Skin is warm and dry.  Neurological:     Mental Status: She is alert and oriented to person, place, and time.  Psychiatric:        Mood and Affect: Mood normal.        Behavior: Behavior normal.        Thought Content: Thought content normal.        Judgment: Judgment normal.      BP 137/71 (BP Location: Right Arm, Patient Position: Sitting, Cuff Size: Large)   Pulse 74   Temp 98.7 F (37.1 C) (Oral)   Resp 16   Ht 5\' 7"  (1.702 m)   Wt 272 lb (123.4 kg)   LMP 05/19/2013   SpO2 100%   BMI 42.60 kg/m  Wt Readings from Last 3 Encounters:  07/03/23 272 lb (123.4 kg)  03/06/23 274 lb (124.3 kg)  02/05/23 273 lb (123.8 kg)      Assessment & Plan:   Problem List Items Addressed This Visit       Unprioritized   Morbid obesity with body mass index (BMI) of 40.0 to 44.9 in adult [facility])   She denies family hx of thyroid CA.  Discussed the potential side effects, she would like to proceed. Will initiate Zepbound.       Relevant Medications   tirzepatide (ZEPBOUND) 2.5 MG/0.5ML Pen   Hypertension - Primary   BP at goal on hydrochlorothiazide.        Hyperglycemia   Lab Results  Component Value Date   HGBA1C 6.2 03/06/2023   Update A1C.       Relevant Orders   HgB A1c   DJD (degenerative joint disease) of knee   Some improvement following gel injections.        Depression   Overall doing well considering  her recent loss of her son and husband's illness. Patient is currently seeing a counselor weekly and taking Zoloft and Xanax as needed. Xanax is taken as half a tablet at night before bed and occasionally during the day as advised by her counselor. -Continue current medications. -Send refill for Xanax to [facility].Contract up to date.       Relevant Medications   ALPRAZolam (XANAX) 0.25 MG tablet   Other Visit Diagnoses       Bereavement       Relevant Medications   ALPRAZolam (XANAX) 0.25 MG tablet       I have discontinued Christine Rodgers's azithromycin. I have also changed her ALPRAZolam. Additionally, I am having her start on Zepbound. Lastly, I am having her maintain her co-enzyme Q-10, Turmeric, dicyclomine, Vitamin D3, albuterol, hydrochlorothiazide, and sertraline.  Meds ordered this encounter  Medications   ALPRAZolam (XANAX) 0.25 MG tablet    Sig: 1/2 to 1 tablet by mouth twice daily as needed    Dispense:  30 tablet    Refill:  0    Supervising Provider:   Danise Edge A [[id number]]   tirzepatide (ZEPBOUND) 2.5 MG/0.5ML Pen  Sig: Inject 2.5 mg into the skin once a week.    Dispense:  2 mL    Refill:  0    Supervising Provider:   Danise Edge A [[id number]]

## 2023-07-03 NOTE — Patient Instructions (Signed)
VISIT SUMMARY:  During today's visit, we discussed your ongoing issues with stress, anxiety, weight management, and knee pain. We also reviewed your current medications. Your blood pressure and borderline diabetes were also addressed.  YOUR PLAN:   -ANXIETY AND DEPRESSION: Anxiety and depression are mental health conditions that can cause significant distress. You are currently managing these with Zoloft and Xanax, and seeing a counselor weekly. We will continue your current medications and send a refill for Xanax to [facility].  -OBESITY: Obesity is a condition characterized by excessive body fat. You are interested in trying Zepbound to assist with weight loss. We discussed potential side effects and insurance coverage. A prescription for Zepbound will be sent to [facility], and we will check your insurance coverage. We will follow up in 5 weeks to assess your response to the medication.  -KNEE PAIN: Knee pain can be caused by various conditions, including arthritis. You have been receiving gel injections for pain relief, which took about three weeks to be effective. We will continue your current treatment.  -HYPERTENSION: Hypertension, or high blood pressure, is a condition in which the force of the blood against your artery walls is too high. Your blood pressure is well controlled with Hydrochlorothiazide 25mg  daily, and we will continue this medication.  -BORDERLINE DIABETES: Borderline diabetes, or prediabetes, is a condition where your blood sugar levels are higher than normal but not high enough to be classified as diabetes. Your last A1C was 6.2. We will order an A1C test, and if it is 6.5 or greater, we may use diabetes as a reason for insurance to cover Zepbound.  INSTRUCTIONS: Continue taking your medications for anxiety and depression, and pick up your Xanax refill from [facility]. Start taking Zepbound once you have confirmed insurance coverage, and we will follow up 3-4 weeks after you  start Zepbound to see how you are doing. Continue your current treatment for knee pain and hypertension. We will also order an A1C test to monitor your blood sugar levels.

## 2023-07-03 NOTE — Assessment & Plan Note (Signed)
She denies family hx of thyroid CA.  Discussed the potential side effects, she would like to proceed. Will initiate Zepbound.

## 2023-07-22 ENCOUNTER — Other Ambulatory Visit: Payer: Self-pay | Admitting: Family

## 2023-07-23 ENCOUNTER — Encounter: Payer: Self-pay | Admitting: Nurse Practitioner

## 2023-07-23 MED ORDER — ZEPBOUND 5 MG/0.5ML ~~LOC~~ SOAJ: 5.0000 mg | SUBCUTANEOUS | 0 refills | Status: DC

## 2023-08-01 ENCOUNTER — Other Ambulatory Visit: Payer: Self-pay | Admitting: Family

## 2023-08-01 DIAGNOSIS — I10 Essential (primary) hypertension: Secondary | ICD-10-CM

## 2023-08-07 ENCOUNTER — Other Ambulatory Visit (HOSPITAL_BASED_OUTPATIENT_CLINIC_OR_DEPARTMENT_OTHER): Payer: Self-pay

## 2023-08-07 ENCOUNTER — Ambulatory Visit: Payer: 59 | Admitting: Family

## 2023-08-07 VITALS — BP 111/69 | HR 82 | Temp 98.7°F | Resp 16 | Ht 67.0 in | Wt 266.0 lb

## 2023-08-07 DIAGNOSIS — I10 Essential (primary) hypertension: Secondary | ICD-10-CM

## 2023-08-07 DIAGNOSIS — F32A Depression, unspecified: Secondary | ICD-10-CM | POA: Diagnosis not present

## 2023-08-07 DIAGNOSIS — L237 Allergic contact dermatitis due to plants, except food: Secondary | ICD-10-CM | POA: Insufficient documentation

## 2023-08-07 DIAGNOSIS — Z6841 Body Mass Index (BMI) 40.0 and over, adult: Secondary | ICD-10-CM | POA: Diagnosis not present

## 2023-08-07 DIAGNOSIS — R739 Hyperglycemia, unspecified: Secondary | ICD-10-CM

## 2023-08-07 DIAGNOSIS — E559 Vitamin D deficiency, unspecified: Secondary | ICD-10-CM

## 2023-08-07 DIAGNOSIS — E785 Hyperlipidemia, unspecified: Secondary | ICD-10-CM | POA: Diagnosis not present

## 2023-08-07 DIAGNOSIS — Z1159 Encounter for screening for other viral diseases: Secondary | ICD-10-CM

## 2023-08-07 DIAGNOSIS — Z634 Disappearance and death of family member: Secondary | ICD-10-CM

## 2023-08-07 LAB — COMPREHENSIVE METABOLIC PANEL
ALT: 69 U/L — ABNORMAL HIGH (ref 0–35)
AST: 46 U/L — ABNORMAL HIGH (ref 0–37)
Albumin: 4.5 g/dL (ref 3.5–5.2)
Alkaline Phosphatase: 77 U/L (ref 39–117)
BUN: 12 mg/dL (ref 6–23)
CO2: 34 meq/L — ABNORMAL HIGH (ref 19–32)
Calcium: 9.4 mg/dL (ref 8.4–10.5)
Chloride: 99 meq/L (ref 96–112)
Creatinine, Ser: 0.6 mg/dL (ref 0.40–1.20)
GFR: 95.8 mL/min (ref >60.00)
Glucose, Bld: 76 mg/dL (ref 70–99)
Potassium: 3.7 meq/L (ref 3.5–5.1)
Sodium: 142 meq/L (ref 135–145)
Total Bilirubin: 0.6 mg/dL (ref 0.2–1.2)
Total Protein: 7.4 g/dL (ref 6.0–8.3)

## 2023-08-07 LAB — LIPID PANEL
Cholesterol: 289 mg/dL — ABNORMAL HIGH (ref 0–200)
HDL: 52.3 mg/dL (ref >39.00)
LDL Cholesterol: 202 mg/dL — ABNORMAL HIGH (ref 0–99)
NonHDL: 236.56
Total CHOL/HDL Ratio: 6
Triglycerides: 173 mg/dL — ABNORMAL HIGH (ref 0.0–149.0)
VLDL: 34.6 mg/dL (ref 0.0–40.0)

## 2023-08-07 MED ORDER — ALPRAZOLAM 0.25 MG PO TABS: ORAL_TABLET | ORAL | 0 refills | Status: DC

## 2023-08-07 MED ORDER — ZEPBOUND 5 MG/0.5ML ~~LOC~~ SOAJ
5.0000 mg | SUBCUTANEOUS | 1 refills | Status: DC
  Filled 2023-08-07 – 2023-08-21 (×2): qty 2, 28d supply, fill #0

## 2023-08-07 MED ORDER — TRIAMCINOLONE ACETONIDE 0.1 % EX CREA
1.0000 | TOPICAL_CREAM | Freq: Two times a day (BID) | CUTANEOUS | 1 refills | Status: AC
  Filled 2024-05-02: qty 15, 30d supply, fill #0

## 2023-08-07 NOTE — Progress Notes (Signed)
Subjective:     Patient ID: Christine Rodgers, female    DOB: 1961-04-25, 63 y.o.   MRN: [id number]  Chief Complaint  Patient presents with   Obesity    Here for follow up on zepbound    Poison Ivy    Reports poison ivy on left arm and abdomen    HPI  Discussed the use of AI scribe software for clinical note transcription with the patient, who gave verbal consent to proceed.  History of Present Illness   Christine Rodgers is a 63 year old female with prolonged QT and anxiety who presents for a follow-up on Zepbound treatment.  She is currently five weeks into her Zepbound treatment, having increased the dose to 5 mg. She experiences significant nausea after taking the injection, which lessens as the week progresses, and her appetite increases. She also has difficulty with bowel movements, which she manages with dried apricots, though excessive consumption causes other issues. She has lost approximately 12 pounds since starting the medication, averaging about two pounds per week. She feels more mobile, attributing this to both the weight loss and receiving gel injections in her knees.  She has a history of anxiety and depression, for which she is seeing a counselor weekly. She is taking Zoloft and Xanax as needed, using Xanax every night to aid sleep. She feels lighter and more positive after receiving a promotion at work, although she still experiences emotional challenges, particularly around significant dates such as her son's birthday.  She has a history of prolonged QT, which limits her options for nausea medication. She is currently taking hydrochlorothiazide and metoprolol for blood pressure management and has not needed to use her inhaler recently. She shares an inhaler with her son, who has similar respiratory issues.  She is experiencing a persistent poison ivy rash for the past two weeks, which is spreading and causing significant itching. She has tried over-the-counter treatments  like Benadryl but finds them insufficient. She has eczema and has used her son's eczema cream, but it is not effective for the rash.  She reports a family history of high cholesterol, with relatives living into their nineties with minimal heart issues. Her last cholesterol check in 2023 was 310. She has made significant dietary changes, avoiding greasy foods and opting for healthier snacks like carrots and apples, which she feels has contributed to her weight loss.     Wt Readings from Last 3 Encounters:  08/07/23 266 lb (120.7 kg)  07/03/23 272 lb (123.4 kg)  03/06/23 274 lb (124.3 kg)        Health Maintenance Due  Topic Date Due   Pneumococcal Vaccine 50-50 Years old (1 of 2 - PCV) Never done   Hepatitis C Screening  Never done   COVID-19 Vaccine (5 - 2024-25 season) 02/15/2023    Past Medical History:  Diagnosis Date   Acute diverticulitis 07/25/2019   Allergy    Anemia    occasional low hbg   Arthritis    knees   Asthma    CHICKENPOX, HX OF 04/17/2010   Qualifier: Diagnosis of  By: Jens Som, MD, Lyn Hollingshead    Dysrhythmia    Eczema    Family history of anesthesia complication    father    Fibromyalgia    History of chicken pox    Hyperglycemia    Hyperlipidemia    Pneumonia    3-4 years ago    Past Surgical History:  Procedure Laterality Date  BUNIONECTOMY  2008   right foot   CERVICAL BIOPSY     benign   KNEE ARTHROSCOPY Left 01/23/2014   Procedure: ARTHROSCOPY KNEE;  Surgeon: Dannielle Huh, MD;  Location: [facility] OR;  Service: Orthopedics;  Laterality: Left;    Family History  Problem Relation Age of Onset   Hyperlipidemia Mother    Hyperlipidemia Father        Deceased 29- sepsis/?pneumonia   Arrhythmia Father    Diabetes Paternal Grandmother    Stroke Paternal Grandmother    Stroke Paternal Grandfather    Ulcerative colitis Daughter    Diabetes Maternal Grandmother    Diabetes Maternal Grandfather    Colon cancer Neg Hx    Stomach cancer  Neg Hx     Social History   Socioeconomic History   Marital status: Married    Spouse name: Not on file   Number of children: 3   Years of education: Not on file   Highest education level: Some college, no degree  Occupational History   Occupation: EXECUTIVE ADMIN ASST    Employer: UNEMPLOYED  Tobacco Use   Smoking status: Former    Current packs/day: 0.00    Average packs/day: 0.5 packs/day for 2.0 years (1.0 ttl pk-yrs)    Types: Cigarettes    Start date: 01/17/2002    Quit date: 01/18/2004    Years since quitting: 19.5   Smokeless tobacco: Never  Substance and Sexual Activity   Alcohol use: Yes    Alcohol/week: 7.0 standard drinks of alcohol    Types: 7 Glasses of wine per week    Comment: occ   Drug use: No   Sexual activity: Yes  Other Topics Concern   Not on file  Social History Narrative   Regular exercise:  NoCaffeine use:  2 mugs of coffee daily   Works as a Geneticist, 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   Married- husband has cancer (metastatic Prostate cancer)   Social Drivers of Health   Financial Resource Strain: Low Risk  (08/07/2023)   Overall Financial Resource Strain (CARDIA)    Difficulty of Paying Living Expenses: Not very hard  Food Insecurity: No Food Insecurity (08/07/2023)   Hunger Vital Sign    Worried About Running Out of Food in the Last Year: Never true    Ran Out of Food in the Last Year: Never true  Transportation Needs: No Transportation Needs (08/07/2023)   PRAPARE - Administrator, Civil Service (Medical): No    Lack of Transportation (Non-Medical): No  Physical Activity: Insufficiently Active (08/07/2023)   Exercise Vital Sign    Days of Exercise per Week: 3 days    Minutes of Exercise per Session: 20 min  Stress: Stress Concern Present (08/07/2023)   [employer] of Occupational Health - Occupational Stress Questionnaire     Feeling of Stress : To some extent  Social Connections: Moderately Isolated (08/07/2023)   Social Connection and Isolation Panel [NHANES]    Frequency of Communication with Friends and Family: More than three times a week    Frequency of Social Gatherings with Friends and Family: Once a week    Attends Religious Services: Never    Database administrator or Organizations: No    Attends Engineer, structural: Not on file    Marital Status: Married  Catering manager Violence: Not  on file    Outpatient Medications Prior to Visit  Medication Sig Dispense Refill   albuterol (VENTOLIN HFA) 108 (90 Base) MCG/ACT inhaler Inhale 2 puffs into the lungs every 6 (six) hours as needed for wheezing or shortness of breath. 8 g 0   Cholecalciferol (VITAMIN D3) 75 MCG (3000 UT) TABS Take 1 tablet by mouth daily. 30 tablet    co-enzyme Q-10 30 MG capsule Take 30 mg by mouth daily.     dicyclomine (BENTYL) 10 MG capsule Take 1 capsule (10 mg total) by mouth 3 (three) times daily between meals as needed for spasms. 60 capsule 0   hydrochlorothiazide (HYDRODIURIL) 25 MG tablet Take 1 tablet (25 mg total) by mouth daily. 90 tablet 0   sertraline (ZOLOFT) 50 MG tablet TAKE 1 TABLET BY MOUTH AT  BEDTIME 90 tablet 3   Turmeric 500 MG TABS Take 250 tablets by mouth daily.     ALPRAZolam (XANAX) 0.25 MG tablet 1/2 to 1 tablet by mouth twice daily as needed 30 tablet 0   tirzepatide (ZEPBOUND) 5 MG/0.5ML Pen Inject 5 mg into the skin once a week. 2 mL 0   No facility-administered medications prior to visit.    Allergies  Allergen Reactions   Amlodipine Nausea Only   Aspirin     REACTION: severe nausea   Codeine     REACTION: severe nausea   Doxycycline Hyclate     REACTION: hives, throat swelling   Erythromycin     Rash, gi side effects.     Lisinopril Itching    Cough, congestion, itching, nasal congestion   Metronidazole Hives and Itching    Patient reports 30 minutes after taking itching ,  blisters and diarrhea occur   Penicillins     REACTION: passed out   Prozac [Fluoxetine]     Had hallucinations   Sulfonamide Derivatives     REACTION: diarrhea, vomitting   Wellbutrin [Bupropion]     Worsening depression   Levaquin [Levofloxacin In D5w] Rash    ROS    See HPI Objective:    Physical Exam Constitutional:      General: She is not in acute distress.    Appearance: Normal appearance. She is well-developed.  HENT:     Head: Normocephalic and atraumatic.     Right Ear: External ear normal.     Left Ear: External ear normal.  Eyes:     General: No scleral icterus. Neck:     Thyroid: No thyromegaly.  Cardiovascular:     Rate and Rhythm: Normal rate and regular rhythm.     Heart sounds: Normal heart sounds. No murmur heard. Pulmonary:     Effort: Pulmonary effort is normal. No respiratory distress.     Breath sounds: Normal breath sounds. No wheezing.  Musculoskeletal:     Cervical back: Neck supple.  Skin:    General: Skin is warm and dry.  Neurological:     Mental Status: She is alert and oriented to person, place, and time.  Psychiatric:        Mood and Affect: Mood normal.        Behavior: Behavior normal.        Thought Content: Thought content normal.        Judgment: Judgment normal.      BP 111/69 (BP Location: Right Arm, Patient Position: Sitting, Cuff Size: Large)   Pulse 82   Temp 98.7 F (37.1 C) (Oral)   Resp 16   Ht 5'  7" (1.702 m)   Wt 266 lb (120.7 kg)   LMP 05/19/2013   SpO2 96%   BMI 41.66 kg/m  Wt Readings from Last 3 Encounters:  08/07/23 266 lb (120.7 kg)  07/03/23 272 lb (123.4 kg)  03/06/23 274 lb (124.3 kg)       Assessment & Plan:   Problem List Items Addressed This Visit       Unprioritized   Vitamin D deficiency   Vit D normal.       Morbid obesity with body mass index (BMI) of 40.0 to 44.9 in adult [facility]) - Primary   Notes + nausea. Can't rx zofran due to prolonged QT.  Will continue the zepbound 5mg .         Relevant Medications   tirzepatide (ZEPBOUND) 5 MG/0.5ML Pen   Hypertension   BP Readings from Last 3 Encounters:  08/07/23 111/69  07/03/23 137/71  03/06/23 122/68   BP stable on metoprolol and hydrochlorothiazide.       Hyperlipidemia   Lab Results  Component Value Date   CHOL 310 (H) 07/03/2021   HDL 58.50 07/03/2021   LDLCALC 233 (H) 01/12/2012   LDLDIRECT 219.0 07/03/2021   TRIG 212.0 (H) 07/03/2021   CHOLHDL 5 07/03/2021   Will update lipid panel.  She is open to statin if indicated.       Relevant Orders   Lipid panel (Completed)   Comp Met (CMET) (Completed)   Hyperglycemia   Lab Results  Component Value Date   HGBA1C 6.4 07/03/2023   Now on zepbound.       Depression   Overall stable on zoloft, counseling, prn xanax.        Relevant Medications   ALPRAZolam (XANAX) 0.25 MG tablet   Allergic contact dermatitis due to plants, except food   Trial of triamcinolone cream.       Other Visit Diagnoses       Encounter for hepatitis C screening test for low risk patient       Relevant Orders   Hepatitis C Antibody     Bereavement       Relevant Medications   ALPRAZolam (XANAX) 0.25 MG tablet       I am having Christine Rodgers start on triamcinolone cream. I am also having her maintain her co-enzyme Q-10, Turmeric, dicyclomine, Vitamin D3, albuterol, sertraline, hydrochlorothiazide, ALPRAZolam, and Zepbound.  Meds ordered this encounter  Medications   triamcinolone cream (KENALOG) 0.1 %    Sig: Apply 1 Application topically 2 (two) times daily.    Dispense:  30 g    Refill:  1    Supervising Provider:   Danise Edge A [[id number]]   ALPRAZolam (XANAX) 0.25 MG tablet    Sig: 1/2 to 1 tablet by mouth twice daily as needed    Dispense:  30 tablet    Refill:  0    Supervising Provider:   Danise Edge A [[id number]]   tirzepatide (ZEPBOUND) 5 MG/0.5ML Pen    Sig: Inject 5 mg into the skin once a week.    Dispense:  2 mL    Refill:  1    Supervising  Provider:   Danise Edge A [[id number]]

## 2023-08-07 NOTE — Assessment & Plan Note (Signed)
BP Readings from Last 3 Encounters:  08/07/23 111/69  07/03/23 137/71  03/06/23 122/68   BP stable on metoprolol and hydrochlorothiazide.

## 2023-08-07 NOTE — Assessment & Plan Note (Signed)
Lab Results  Component Value Date   CHOL 310 (H) 07/03/2021   HDL 58.50 07/03/2021   LDLCALC 233 (H) 01/12/2012   LDLDIRECT 219.0 07/03/2021   TRIG 212.0 (H) 07/03/2021   CHOLHDL 5 07/03/2021   Will update lipid panel.  She is open to statin if indicated.

## 2023-08-07 NOTE — Assessment & Plan Note (Signed)
Notes + nausea. Can't rx zofran due to prolonged QT.  Will continue the zepbound 5mg .

## 2023-08-07 NOTE — Assessment & Plan Note (Signed)
Vit D normal.

## 2023-08-07 NOTE — Assessment & Plan Note (Signed)
Lab Results  Component Value Date   HGBA1C 6.4 07/03/2023   Now on zepbound.

## 2023-08-07 NOTE — Assessment & Plan Note (Signed)
Overall stable on zoloft, counseling, prn xanax.

## 2023-08-07 NOTE — Assessment & Plan Note (Signed)
>>  ASSESSMENT AND PLAN FOR MORBID OBESITY WITH BODY MASS INDEX (BMI) OF 40.0 TO 44.9 IN ADULT ([facility]) WRITTEN ON 08/07/2023  1:19 PM BY Rodgers, Christine Shatto, NP  Notes + nausea. Can't rx zofran  due to prolonged QT.  Will continue the zepbound  5mg .

## 2023-08-07 NOTE — Assessment & Plan Note (Signed)
Trial of triamcinolone cream  

## 2023-08-07 NOTE — Patient Instructions (Signed)
VISIT SUMMARY:  Today, we reviewed your progress with Zepbound treatment, discussed your ongoing issues with nausea and constipation, and addressed your anxiety, depression, and recent poison ivy rash. We also talked about your cholesterol levels and general health maintenance.  YOUR PLAN:  -OBESITY: Obesity is a condition characterized by excessive body fat. You have been on Zepbound 5mg  for 5 weeks and have experienced modest weight loss. Continue with the current dose, and we will reevaluate in one month to consider increasing the dose to 7.5mg . We will also check your cholesterol and A1C levels.  -NAUSEA: Nausea is a feeling of sickness with an inclination to vomit. Your nausea is a side effect of Zepbound, and due to your history of prolonged QT, we cannot use Zofran. Continue with your current management strategies.  -ANXIETY/DEPRESSION: Anxiety and depression are mental health conditions that affect your mood and overall well-being. You are stable on Zoloft and Xanax as needed, and you are attending regular counseling sessions. Continue with your current medications and counseling.  -CONTACT DERMATITIS (POISON IVY): Contact dermatitis is a skin rash caused by contact with a certain substance. Your poison ivy rash has persisted for two weeks. Apply the prescribed topical steroid cream twice daily, and if there is no improvement in a few days, we may consider oral prednisone.  -HYPERLIPIDEMIA: Hyperlipidemia is a condition with high levels of lipids (fats) in the blood. Your last cholesterol check was 310. We will repeat the cholesterol check and discuss the potential need for statin therapy based on the results and a risk assessment.  -GENERAL HEALTH MAINTENANCE: We will conduct a one-time Hepatitis C screening and follow up in three months to review your overall health and any necessary lab results.  INSTRUCTIONS:  Please continue with your current medications and management strategies.  Apply the topical steroid cream for your poison ivy rash twice daily. We will reevaluate your Zepbound dose in one month and check your cholesterol and A1C levels. Follow up in three months for a general health review and one-time Hepatitis C screening.

## 2023-08-08 LAB — HEPATITIS C ANTIBODY: Hepatitis C Ab: NONREACTIVE

## 2023-08-10 ENCOUNTER — Telehealth: Payer: Self-pay | Admitting: Family

## 2023-08-10 MED ORDER — ATORVASTATIN CALCIUM 40 MG PO TABS: 40.0000 mg | ORAL_TABLET | Freq: Every day | ORAL | 1 refills | Status: DC

## 2023-08-10 NOTE — Telephone Encounter (Signed)
 Cholesterol is extremely elevated. I would recommend that she begin atorvastatin (cholesterol) medication once daily.   Liver testing is mildly elevated. This is most likely due to fatty liver which was noted on previous abdominal imaging. Low fat diet, exercise and weight loss are the best treatment for this.   Hep C testing is negative.

## 2023-08-11 NOTE — Telephone Encounter (Signed)
 Patient notified of this information.

## 2023-08-11 NOTE — Telephone Encounter (Signed)
 10 year risk of heart attack/stroke is 15%.  It is recommended that statins be used if >10% or if LDL "bad cholesterol" is 190 or greater. Her LDL is 202.  So either way, statin is the recommendation to decrease her risk of heart attack and stroke.

## 2023-08-21 ENCOUNTER — Other Ambulatory Visit (HOSPITAL_BASED_OUTPATIENT_CLINIC_OR_DEPARTMENT_OTHER): Payer: Self-pay

## 2023-09-13 ENCOUNTER — Encounter: Payer: Self-pay | Admitting: Family

## 2023-09-14 ENCOUNTER — Other Ambulatory Visit (HOSPITAL_BASED_OUTPATIENT_CLINIC_OR_DEPARTMENT_OTHER): Payer: Self-pay

## 2023-09-14 MED ORDER — TIRZEPATIDE-WEIGHT MANAGEMENT 7.5 MG/0.5ML ~~LOC~~ SOAJ
7.5000 mg | SUBCUTANEOUS | 3 refills | Status: DC
  Filled 2023-09-14: qty 2, 28d supply, fill #0
  Filled 2023-10-19: qty 2, 28d supply, fill #1

## 2023-09-14 MED ORDER — TIRZEPATIDE-WEIGHT MANAGEMENT 7.5 MG/0.5ML ~~LOC~~ SOLN: 7.5000 mg | SUBCUTANEOUS | 3 refills | Status: DC

## 2023-09-14 NOTE — Telephone Encounter (Signed)
 Could you please cancel zepbound rx at [facility] on [address] [place]?

## 2023-09-24 ENCOUNTER — Other Ambulatory Visit (HOSPITAL_BASED_OUTPATIENT_CLINIC_OR_DEPARTMENT_OTHER): Payer: Self-pay

## 2023-10-08 ENCOUNTER — Other Ambulatory Visit: Payer: Self-pay | Admitting: Family

## 2023-10-08 DIAGNOSIS — I10 Essential (primary) hypertension: Secondary | ICD-10-CM

## 2023-11-13 ENCOUNTER — Other Ambulatory Visit (HOSPITAL_BASED_OUTPATIENT_CLINIC_OR_DEPARTMENT_OTHER): Payer: Self-pay

## 2023-11-13 ENCOUNTER — Encounter: Payer: Self-pay | Admitting: Family

## 2023-11-13 MED ORDER — TIRZEPATIDE-WEIGHT MANAGEMENT 10 MG/0.5ML ~~LOC~~ SOAJ
10.0000 mg | SUBCUTANEOUS | 1 refills | Status: DC
  Filled 2023-11-13: qty 2, 28d supply, fill #0

## 2023-11-30 ENCOUNTER — Other Ambulatory Visit: Payer: Self-pay | Admitting: Family

## 2023-11-30 DIAGNOSIS — Z634 Disappearance and death of family member: Secondary | ICD-10-CM

## 2023-12-04 ENCOUNTER — Telehealth: Payer: Self-pay

## 2023-12-04 ENCOUNTER — Other Ambulatory Visit (HOSPITAL_COMMUNITY): Payer: Self-pay

## 2023-12-04 NOTE — Telephone Encounter (Signed)
 Pharmacy Patient Advocate Encounter   Received notification from CoverMyMeds that prior authorization for Zepbound  2.5 is required/requested.   Insurance verification completed.   The patient is insured through [facility] .   Per test claim: The current 28 day co-pay is, $24.99.  No PA needed at this time. This test claim was processed through [facility]- copay amounts may vary at other pharmacies due to pharmacy/plan contracts, or as the patient moves through the different stages of their insurance plan.

## 2023-12-15 ENCOUNTER — Ambulatory Visit: Admitting: Family

## 2023-12-15 VITALS — BP 119/70 | HR 80 | Temp 98.8°F | Resp 12 | Ht 67.0 in | Wt 241.4 lb

## 2023-12-15 DIAGNOSIS — R739 Hyperglycemia, unspecified: Secondary | ICD-10-CM

## 2023-12-15 DIAGNOSIS — E785 Hyperlipidemia, unspecified: Secondary | ICD-10-CM | POA: Diagnosis not present

## 2023-12-15 DIAGNOSIS — G4700 Insomnia, unspecified: Secondary | ICD-10-CM | POA: Diagnosis not present

## 2023-12-15 DIAGNOSIS — I10 Essential (primary) hypertension: Secondary | ICD-10-CM

## 2023-12-15 DIAGNOSIS — F419 Anxiety disorder, unspecified: Secondary | ICD-10-CM

## 2023-12-15 DIAGNOSIS — E559 Vitamin D deficiency, unspecified: Secondary | ICD-10-CM | POA: Diagnosis not present

## 2023-12-15 DIAGNOSIS — F32A Depression, unspecified: Secondary | ICD-10-CM

## 2023-12-15 DIAGNOSIS — Z6837 Body mass index (BMI) 37.0-37.9, adult: Secondary | ICD-10-CM

## 2023-12-15 DIAGNOSIS — Z634 Disappearance and death of family member: Secondary | ICD-10-CM

## 2023-12-15 MED ORDER — TIRZEPATIDE-WEIGHT MANAGEMENT 10 MG/0.5ML ~~LOC~~ SOAJ
10.0000 mg | SUBCUTANEOUS | 1 refills | Status: DC
  Filled 2023-12-15: qty 2, 28d supply, fill #0

## 2023-12-15 MED ORDER — ALPRAZOLAM 0.25 MG PO TABS
ORAL_TABLET | ORAL | 0 refills | Status: DC
Start: 2023-12-15 — End: 2024-02-28
  Filled 2023-12-15: qty 30, 15d supply, fill #0

## 2023-12-15 NOTE — Assessment & Plan Note (Signed)
 Lab Results  Component Value Date   HGBA1C 6.4 07/03/2023

## 2023-12-15 NOTE — Assessment & Plan Note (Signed)
 Lab Results  Component Value Date   CHOL 289 (H) 08/07/2023   HDL 52.30 08/07/2023   LDLCALC 202 (H) 08/07/2023   LDLDIRECT 219.0 07/03/2021   TRIG 173.0 (H) 08/07/2023   CHOLHDL 6 08/07/2023    LDL 202 mg/dL, total cholesterol 710 mg/dL. Familial hyperlipidemia. Explained statin benefits and side effects. Cardiovascular risk score 6.7% not factoring borderline diabetes.  DM would raise higher. - Recommended that patient start atorvastatin  as prescribed. - Recheck cholesterol levels at next visit.

## 2023-12-15 NOTE — Assessment & Plan Note (Signed)
 Continues vit D 3000 international units daily.

## 2023-12-15 NOTE — Progress Notes (Unsigned)
 Subjective:     Patient ID: Christine Rodgers, female    DOB: 1960/10/08, 63 y.o.   MRN: [id number]  Chief Complaint  Patient presents with   Follow-up    HPI  Discussed the use of AI scribe software for clinical note transcription with the patient, who gave verbal consent to proceed.  History of Present Illness  Christine Rodgers is a 63 year old female who presents for weight management and medication review.  She has lost weight from 272 pounds in January to 241 pounds, averaging 1.5 pounds per week, with occasional plateaus. She is on Zepbound , last dosed at 10 mg, and experiences nausea with new doses, which resolves after a few weeks. Her knee pain has decreased with weight loss, improving mobility, but she anticipates needing knee injections in July.  She takes a half pill of Xanax  at night to aid sleep, avoiding a full pill due to morning grogginess. She has not started atorvastatin  for her LDL cholesterol of 202 mg/dL and total cholesterol of 289 mg/dL due to concerns about side effects. There is a family history of high cholesterol with longevity into the 90s.  She experienced a recent panic attack related to work stress, compounded by the loss of her son to suicide less than a year ago. She is on sertraline , taking 25 mg at bedtime. Her social history includes significant stressors such as her husband's health issues, financial constraints, and relationship challenges, including a lack of intimacy.     Health Maintenance Due  Topic Date Due   Pneumococcal Vaccine 33-19 Years old (1 of 2 - PCV) Never done   COVID-19 Vaccine (5 - 2024-25 season) 02/15/2023   Cervical Cancer Screening (HPV/Pap Cotest)  01/29/2024    Past Medical History:  Diagnosis Date   Acute diverticulitis 07/25/2019   Allergy    Anemia    occasional low hbg   Arthritis    knees   Asthma    CHICKENPOX, HX OF 04/17/2010   Qualifier: Diagnosis of  By: Pietro, MD, CODY Redell Dimes     Dysrhythmia    Eczema    Family history of anesthesia complication    father    Fibromyalgia    History of chicken pox    Hyperglycemia    Hyperlipidemia    Pneumonia    3-4 years ago    Past Surgical History:  Procedure Laterality Date   BUNIONECTOMY  2008   right foot   CERVICAL BIOPSY     benign   KNEE ARTHROSCOPY Left 01/23/2014   Procedure: ARTHROSCOPY KNEE;  Surgeon: Marcey Raman, MD;  Location: [facility] OR;  Service: Orthopedics;  Laterality: Left;    Family History  Problem Relation Age of Onset   Hyperlipidemia Mother    Hyperlipidemia Father        Deceased 22- sepsis/?pneumonia   Arrhythmia Father    Diabetes Paternal Grandmother    Stroke Paternal Grandmother    Stroke Paternal Grandfather    Ulcerative colitis Daughter    Diabetes Maternal Grandmother    Diabetes Maternal Grandfather    Colon cancer Neg Hx    Stomach cancer Neg Hx     Social History   Socioeconomic History   Marital status: Married    Spouse name: Not on file   Number of children: 3   Years of education: Not on file   Highest education level: Some college, no degree  Occupational History   Occupation: EXECUTIVE ADMIN ASST  Employer: UNEMPLOYED  Tobacco Use   Smoking status: Former    Current packs/day: 0.00    Average packs/day: 0.5 packs/day for 2.0 years (1.0 ttl pk-yrs)    Types: Cigarettes    Start date: 01/17/2002    Quit date: 01/18/2004    Years since quitting: 19.9   Smokeless tobacco: Never  Substance and Sexual Activity   Alcohol use: Yes    Alcohol/week: 7.0 standard drinks of alcohol    Types: 7 Glasses of wine per week    Comment: occ   Drug use: No   Sexual activity: Yes  Other Topics Concern   Not on file  Social History Narrative   Regular exercise:  NoCaffeine use:  2 mugs of coffee daily   Works as a Geneticist, 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   Married-  husband has cancer (metastatic Prostate cancer)   Social Drivers of Health   Financial Resource Strain: Low Risk  (08/07/2023)   Overall Financial Resource Strain (CARDIA)    Difficulty of Paying Living Expenses: Not very hard  Food Insecurity: No Food Insecurity (08/07/2023)   Hunger Vital Sign    Worried About Running Out of Food in the Last Year: Never true    Ran Out of Food in the Last Year: Never true  Transportation Needs: No Transportation Needs (08/07/2023)   PRAPARE - Administrator, Civil Service (Medical): No    Lack of Transportation (Non-Medical): No  Physical Activity: Insufficiently Active (08/07/2023)   Exercise Vital Sign    Days of Exercise per Week: 3 days    Minutes of Exercise per Session: 20 min  Stress: Stress Concern Present (08/07/2023)   [employer] of Occupational Health - Occupational Stress Questionnaire    Feeling of Stress : To some extent  Social Connections: Moderately Isolated (08/07/2023)   Social Connection and Isolation Panel    Frequency of Communication with Friends and Family: More than three times a week    Frequency of Social Gatherings with Friends and Family: Once a week    Attends Religious Services: Never    Database administrator or Organizations: No    Attends Engineer, structural: Not on file    Marital Status: Married  Catering manager Violence: Not on file    Outpatient Medications Prior to Visit  Medication Sig Dispense Refill   albuterol  (VENTOLIN  HFA) 108 (90 Base) MCG/ACT inhaler Inhale 2 puffs into the lungs every 6 (six) hours as needed for wheezing or shortness of breath. 8 g 0   Cholecalciferol (VITAMIN D3) 75 MCG (3000 UT) TABS Take 1 tablet by mouth daily. 30 tablet    co-enzyme Q-10 30 MG capsule Take 30 mg by mouth daily.     dicyclomine  (BENTYL ) 10 MG capsule Take 1 capsule (10 mg total) by mouth 3 (three) times daily between meals as needed for spasms. 60 capsule 0   hydrochlorothiazide   (HYDRODIURIL ) 25 MG tablet TAKE 1 TABLET BY MOUTH DAILY 90 tablet 1   sertraline  (ZOLOFT ) 50 MG tablet TAKE 1 TABLET BY MOUTH AT  BEDTIME 90 tablet 3   triamcinolone  cream (KENALOG ) 0.1 % Apply 1 Application topically 2 (two) times daily. 30 g 1   Turmeric 500 MG TABS Take 250 tablets by mouth daily.     ALPRAZolam  (XANAX ) 0.25 MG tablet TAKE 1/2 TO 1  TABLET BY MOUTH TWICE DAILY AS NEEDED 30 tablet 0   tirzepatide  (ZEPBOUND ) 10 MG/0.5ML Pen Inject 10 mg into the skin once a week. 2 mL 1   atorvastatin  (LIPITOR) 40 MG tablet Take 1 tablet (40 mg total) by mouth daily. (Patient not taking: Reported on 12/15/2023) 90 tablet 1   No facility-administered medications prior to visit.    Allergies  Allergen Reactions   Amlodipine  Nausea Only   Aspirin     REACTION: severe nausea   Codeine     REACTION: severe nausea   Doxycycline Hyclate     REACTION: hives, throat swelling   Erythromycin     Rash, gi side effects.     Lisinopril  Itching    Cough, congestion, itching, nasal congestion   Metronidazole  Hives and Itching    Patient reports 30 minutes after taking itching , blisters and diarrhea occur   Penicillins     REACTION: passed out   Prozac [Fluoxetine]     Had hallucinations   Sulfonamide Derivatives     REACTION: diarrhea, vomitting   Wellbutrin  [Bupropion ]     Worsening depression   Levaquin  [Levofloxacin  In D5w] Rash    ROS See HPI    Objective:    Physical Exam Constitutional:      General: She is not in acute distress.    Appearance: Normal appearance. She is well-developed.  HENT:     Head: Normocephalic and atraumatic.     Right Ear: External ear normal.     Left Ear: External ear normal.  Eyes:     General: No scleral icterus. Neck:     Thyroid: No thyromegaly.  Cardiovascular:     Rate and Rhythm: Normal rate and regular rhythm.     Heart sounds: Normal heart sounds. No murmur heard. Pulmonary:     Effort: Pulmonary effort is normal. No respiratory  distress.     Breath sounds: Normal breath sounds. No wheezing.  Musculoskeletal:     Cervical back: Neck supple.  Skin:    General: Skin is warm and dry.  Neurological:     Mental Status: She is alert and oriented to person, place, and time.  Psychiatric:        Mood and Affect: Mood normal.        Behavior: Behavior normal.        Thought Content: Thought content normal.        Judgment: Judgment normal.      BP 119/70 (BP Location: Right Arm, Patient Position: Sitting, Cuff Size: Large)   Pulse 80   Temp 98.8 F (37.1 C) (Oral)   Resp 12   Ht 5' 7 (1.702 m)   Wt 241 lb 6.4 oz (109.5 kg)   LMP 05/19/2013   SpO2 97%   BMI 37.81 kg/m  Wt Readings from Last 3 Encounters:  12/15/23 241 lb 6.4 oz (109.5 kg)  08/07/23 266 lb (120.7 kg)  07/03/23 272 lb (123.4 kg)       Assessment & Plan:   Problem List Items Addressed This Visit       Unprioritized   Vitamin D  deficiency   Continues vit D 3000 international units daily.       Hypertension   BP Readings from Last 3 Encounters:  12/15/23 119/70  08/07/23 111/69  07/03/23 137/71   BP stable on hydrochlorothiazide , toprol  xl,       Hyperlipidemia   Lab Results  Component Value Date   CHOL 289 (H) 08/07/2023  HDL 52.30 08/07/2023   LDLCALC 202 (H) 08/07/2023   LDLDIRECT 219.0 07/03/2021   TRIG 173.0 (H) 08/07/2023   CHOLHDL 6 08/07/2023    LDL 202 mg/dL, total cholesterol 710 mg/dL. Familial hyperlipidemia. Explained statin benefits and side effects. Cardiovascular risk score 6.7% not factoring borderline diabetes.  DM would raise higher. - Recommended that patient start atorvastatin  as prescribed. - Recheck cholesterol levels at next visit.      Hyperglycemia   Lab Results  Component Value Date   HGBA1C 6.4 07/03/2023         Relevant Orders   HgB A1c (Completed)   Basic Metabolic Panel (BMET) (Completed)   BMI 37.0-37.9, adult - Primary    Weight loss plateau despite Zepbound  10 mg.  Discussed potential dose increase and side effects. Encouraged physical activity. - Continue Zepbound  at 10 mg. - Consider increasing Zepbound  to 12.5 mg if weight loss plateau persists. - Encourage increased physical activity as tolerated. - Refill prescription for Zepbound  10 mg.      Relevant Medications   tirzepatide  (ZEPBOUND ) 10 MG/0.5ML Pen   Anxiety and depression   Uncontrolled.  Continues counseling. Advised pt to increase sertraline  from 25mg  to 50mg  nightly.  Continue xanax  prn. Updated Controlled Substance Contract.      Relevant Medications   ALPRAZolam  (XANAX ) 0.25 MG tablet   Other Visit Diagnoses       Insomnia, unspecified type       Relevant Medications   ALPRAZolam  (XANAX ) 0.25 MG tablet       I have changed Avayah P. [place]'s ALPRAZolam . I am also having her maintain her co-enzyme Q-10, Turmeric, dicyclomine , Vitamin D3, albuterol , sertraline , triamcinolone  cream, atorvastatin , hydrochlorothiazide , and tirzepatide .  Meds ordered this encounter  Medications   tirzepatide  (ZEPBOUND ) 10 MG/0.5ML Pen    Sig: Inject 10 mg into the skin once a week.    Dispense:  2 mL    Refill:  1    Supervising Provider:   DOMENICA BLACKBIRD A [[id number]]   ALPRAZolam  (XANAX ) 0.25 MG tablet    Sig: Take one-half to 1 tablet (0.125 mg to 0.25 mg) by mouth twice daily as needed.    Dispense:  30 tablet    Refill:  0    Do not fill prior to 7/15    Supervising Provider:   DOMENICA BLACKBIRD A [id number]

## 2023-12-15 NOTE — Assessment & Plan Note (Signed)
 BP Readings from Last 3 Encounters:  12/15/23 119/70  08/07/23 111/69  07/03/23 137/71   BP stable on hydrochlorothiazide , toprol  xl,

## 2023-12-16 ENCOUNTER — Other Ambulatory Visit (HOSPITAL_BASED_OUTPATIENT_CLINIC_OR_DEPARTMENT_OTHER): Payer: Self-pay

## 2023-12-16 LAB — BASIC METABOLIC PANEL WITH GFR
BUN: 16 mg/dL (ref 6–23)
CO2: 27 meq/L (ref 19–32)
Calcium: 9.7 mg/dL (ref 8.4–10.5)
Chloride: 100 meq/L (ref 96–112)
Creatinine, Ser: 0.72 mg/dL (ref 0.40–1.20)
GFR: 89.01 mL/min (ref >60.00)
Glucose, Bld: 85 mg/dL (ref 70–99)
Potassium: 3.4 meq/L — ABNORMAL LOW (ref 3.5–5.1)
Sodium: 137 meq/L (ref 135–145)

## 2023-12-16 LAB — HEMOGLOBIN A1C: Hgb A1c MFr Bld: 5.4 % (ref 4.6–6.5)

## 2023-12-16 NOTE — Patient Instructions (Signed)
 VISIT SUMMARY:  During your visit, we discussed your progress with weight management, reviewed your medications, and addressed several health concerns including knee pain, cholesterol levels, pre-diabetes, anxiety, depression, and hypertension.  YOUR PLAN:  OBESITY: You have been losing weight steadily but have hit a plateau. -Continue taking Zepbound  at 10 mg. -If your weight loss plateau continues, consider increasing Zepbound  to 12.5 mg. -Increase physical activity as much as you can tolerate. -Your prescription for Zepbound  10 mg has been refilled.  KNEE PAIN: Your knee pain has improved with weight loss, but you may need injections soon. -Schedule knee injections as needed, likely in July.  HYPERLIPIDEMIA: Your cholesterol levels are high, which increases your cardiovascular risk. -Start taking atorvastatin  as prescribed. -We will recheck your cholesterol levels at your next visit.  PRE-DIABETES: Your A1c level is 6.4%, which is close to the diabetes threshold and increases your cardiovascular risk. -Monitor your A1c levels regularly.  ANXIETY AND PANIC ATTACKS: You have been experiencing anxiety and panic attacks, managed with sertraline  and Xanax . -Consider increasing your sertraline  dose to 50 mg if you can tolerate it. -Continue taking Xanax  as needed for sleep. -Continue with your counseling sessions.  DEPRESSION: You are dealing with significant stressors affecting your mental health. -Try taking the full dose of sertraline  (50 mg) on a weekend to see if you can tolerate it.  HYPERTENSION: Your blood pressure is well-controlled with your current medication. -Continue taking hydrochlorothiazide  as prescribed.  GENERAL HEALTH MAINTENANCE: Your vitamin D  levels are normal. -Continue taking vitamin D  supplementation at 3000 IU daily. -We will reassess your vitamin D  levels at your next visit.  FOLLOW-UP: You need follow-up for medication adjustments and a potential skin  biopsy. -Schedule a follow-up appointment in one month to assess medication adjustments. -Schedule a skin biopsy for the raised lesion on your ear.

## 2023-12-16 NOTE — Assessment & Plan Note (Signed)
 Uncontrolled.  Continues counseling. Advised pt to increase sertraline  from 25mg  to 50mg  nightly.  Continue xanax  prn. Updated Controlled Substance Contract.

## 2023-12-16 NOTE — Assessment & Plan Note (Signed)
  Weight loss plateau despite Zepbound  10 mg. Discussed potential dose increase and side effects. Encouraged physical activity. - Continue Zepbound  at 10 mg. - Consider increasing Zepbound  to 12.5 mg if weight loss plateau persists. - Encourage increased physical activity as tolerated. - Refill prescription for Zepbound  10 mg.

## 2023-12-21 ENCOUNTER — Telehealth: Payer: Self-pay | Admitting: Family

## 2023-12-21 DIAGNOSIS — E876 Hypokalemia: Secondary | ICD-10-CM

## 2023-12-21 DIAGNOSIS — E785 Hyperlipidemia, unspecified: Secondary | ICD-10-CM

## 2023-12-21 MED ORDER — POTASSIUM CHLORIDE CRYS ER 20 MEQ PO TBCR: 20.0000 meq | EXTENDED_RELEASE_TABLET | Freq: Every day | ORAL | 3 refills | Status: DC

## 2023-12-21 NOTE — Telephone Encounter (Signed)
 Please advise pt that sugar looks great- good work.   Potassium is a little low. This is likely from her being on hydrochlorothiazide  which can bring down potassium.  Please add kdur 20 meQ once daily and repeat bmet in 1 week.

## 2023-12-22 ENCOUNTER — Telehealth: Payer: Self-pay

## 2023-12-22 MED ORDER — ROSUVASTATIN CALCIUM 10 MG PO TABS: 10.0000 mg | ORAL_TABLET | Freq: Every day | ORAL | 3 refills | Status: DC

## 2023-12-22 NOTE — Telephone Encounter (Signed)
 See documentation in phone note from yesterday.

## 2023-12-22 NOTE — Telephone Encounter (Signed)
 Notified pt of below recommendation. Lab appt scheduled and future lab order placed.  Pt also reports that she started atorvastatin  on 12/15/23.  Muscles hurt, headache every day and feels very nauseous since restarting the medication. She has been taking it at night.  Wants to know if symptoms are supposed to dissipate?  If not, she wants to try another alternative.

## 2023-12-22 NOTE — Telephone Encounter (Signed)
 Copied from CRM [id number]. Topic: Clinical - Lab/Test Results >> Dec 22, 2023 11:56 AM Berneda FALCON wrote: Reason for CRM: Pt is returning phone call to Lolita about her lab results. As they are abnormal, I did not discuss the results with the patient directly. Please call patient back at earliest convenience.  Pt callback is [phone]

## 2023-12-22 NOTE — Telephone Encounter (Signed)
 Left message to return my call.

## 2023-12-29 ENCOUNTER — Other Ambulatory Visit (INDEPENDENT_AMBULATORY_CARE_PROVIDER_SITE_OTHER)

## 2023-12-29 DIAGNOSIS — E876 Hypokalemia: Secondary | ICD-10-CM | POA: Diagnosis not present

## 2023-12-29 LAB — BASIC METABOLIC PANEL WITH GFR
BUN: 11 mg/dL (ref 6–23)
CO2: 31 meq/L (ref 19–32)
Calcium: 10.2 mg/dL (ref 8.4–10.5)
Chloride: 102 meq/L (ref 96–112)
Creatinine, Ser: 0.66 mg/dL (ref 0.40–1.20)
GFR: 93.37 mL/min (ref >60.00)
Glucose, Bld: 100 mg/dL — ABNORMAL HIGH (ref 70–99)
Potassium: 4.6 meq/L (ref 3.5–5.1)
Sodium: 140 meq/L (ref 135–145)

## 2023-12-31 ENCOUNTER — Ambulatory Visit: Payer: Self-pay | Admitting: Family

## 2024-01-11 ENCOUNTER — Encounter: Payer: Self-pay | Admitting: Family

## 2024-01-12 ENCOUNTER — Other Ambulatory Visit (HOSPITAL_BASED_OUTPATIENT_CLINIC_OR_DEPARTMENT_OTHER): Payer: Self-pay

## 2024-01-12 MED ORDER — ZEPBOUND 12.5 MG/0.5ML ~~LOC~~ SOAJ: 12.5000 mg | SUBCUTANEOUS | 2 refills | Status: DC

## 2024-01-12 MED ORDER — ZEPBOUND 12.5 MG/0.5ML ~~LOC~~ SOAJ
12.5000 mg | SUBCUTANEOUS | 2 refills | Status: DC
  Filled 2024-01-12: qty 2, 28d supply, fill #0
  Filled 2024-02-02 – 2024-02-10 (×2): qty 2, 28d supply, fill #1
  Filled 2024-03-06: qty 2, 28d supply, fill #2

## 2024-01-12 NOTE — Telephone Encounter (Signed)
 Could you please cancel zepbound  at [facility]? I have resent downstairs instead. Tks.

## 2024-01-13 ENCOUNTER — Other Ambulatory Visit (HOSPITAL_COMMUNITY): Payer: Self-pay

## 2024-01-13 ENCOUNTER — Other Ambulatory Visit (HOSPITAL_BASED_OUTPATIENT_CLINIC_OR_DEPARTMENT_OTHER): Payer: Self-pay

## 2024-01-13 ENCOUNTER — Telehealth: Payer: Self-pay

## 2024-01-13 NOTE — Telephone Encounter (Signed)
 Pharmacy Patient Advocate Encounter   Received notification from CoverMyMeds that prior authorization for Zepbound  12.5 is required/requested.   Insurance verification completed.   The patient is insured through [facility] .   Per test claim: PA required; PA started via CoverMyMeds. KEY [id number] . Waiting for clinical questions to populate.

## 2024-01-13 NOTE — Telephone Encounter (Signed)
 Clinical questions have been answered and PA submitted. PA currently Pending. Please be advised that most companies allow up to 30 days to make a decision. We will advise when a determination has been made, or follow up in 1 week.   Please reach out to our team, Rx Prior Auth Pool, if you haven't heard back in a week.

## 2024-01-13 NOTE — Telephone Encounter (Signed)
 Pharmacy Patient Advocate Encounter  Received notification from [employer] that Prior Authorization for Zepbound  12.5 has been APPROVED from 01/13/24 to 01/12/25. Ran test claim, Copay is $24.99. This test claim was processed through [facility]- copay amounts may vary at other pharmacies due to pharmacy/plan contracts, or as the patient moves through the different stages of their insurance plan.   PA #/Case ID/Reference #: [id number]

## 2024-01-14 ENCOUNTER — Other Ambulatory Visit (HOSPITAL_BASED_OUTPATIENT_CLINIC_OR_DEPARTMENT_OTHER): Payer: Self-pay

## 2024-01-29 ENCOUNTER — Ambulatory Visit: Admitting: Family

## 2024-02-02 ENCOUNTER — Other Ambulatory Visit (HOSPITAL_BASED_OUTPATIENT_CLINIC_OR_DEPARTMENT_OTHER): Payer: Self-pay

## 2024-02-02 MED ORDER — POTASSIUM CHLORIDE CRYS ER 20 MEQ PO TBCR
20.0000 meq | EXTENDED_RELEASE_TABLET | Freq: Every day | ORAL | 2 refills | Status: AC
  Filled 2024-02-02: qty 30, 30d supply, fill #0
  Filled 2024-02-27: qty 30, 30d supply, fill #1
  Filled 2024-03-30: qty 30, 30d supply, fill #2

## 2024-02-10 ENCOUNTER — Other Ambulatory Visit (HOSPITAL_BASED_OUTPATIENT_CLINIC_OR_DEPARTMENT_OTHER): Payer: Self-pay

## 2024-02-18 LAB — RESULTS CONSOLE HPV: CHL HPV: NEGATIVE

## 2024-02-18 LAB — HM MAMMOGRAPHY

## 2024-02-23 LAB — HM PAP SMEAR: HPV, high-risk: NEGATIVE

## 2024-02-28 ENCOUNTER — Other Ambulatory Visit: Payer: Self-pay | Admitting: Family

## 2024-02-28 DIAGNOSIS — G4700 Insomnia, unspecified: Secondary | ICD-10-CM

## 2024-02-28 MED ORDER — ALPRAZOLAM 0.25 MG PO TABS
ORAL_TABLET | ORAL | 0 refills | Status: DC
  Filled 2024-02-28: qty 30, 15d supply, fill #0

## 2024-02-29 ENCOUNTER — Other Ambulatory Visit (HOSPITAL_BASED_OUTPATIENT_CLINIC_OR_DEPARTMENT_OTHER): Payer: Self-pay

## 2024-03-06 ENCOUNTER — Other Ambulatory Visit: Payer: Self-pay | Admitting: Family

## 2024-03-06 DIAGNOSIS — G4700 Insomnia, unspecified: Secondary | ICD-10-CM

## 2024-03-07 ENCOUNTER — Other Ambulatory Visit: Payer: Self-pay

## 2024-03-07 ENCOUNTER — Other Ambulatory Visit (HOSPITAL_BASED_OUTPATIENT_CLINIC_OR_DEPARTMENT_OTHER): Payer: Self-pay

## 2024-03-07 ENCOUNTER — Other Ambulatory Visit: Payer: Self-pay | Admitting: Family

## 2024-03-07 DIAGNOSIS — G4700 Insomnia, unspecified: Secondary | ICD-10-CM

## 2024-03-08 ENCOUNTER — Other Ambulatory Visit (HOSPITAL_BASED_OUTPATIENT_CLINIC_OR_DEPARTMENT_OTHER): Payer: Self-pay

## 2024-04-04 ENCOUNTER — Other Ambulatory Visit: Payer: Self-pay

## 2024-04-04 ENCOUNTER — Other Ambulatory Visit: Payer: Self-pay | Admitting: Family

## 2024-04-04 ENCOUNTER — Other Ambulatory Visit (HOSPITAL_BASED_OUTPATIENT_CLINIC_OR_DEPARTMENT_OTHER): Payer: Self-pay

## 2024-04-04 ENCOUNTER — Telehealth: Payer: Self-pay | Admitting: Family

## 2024-04-04 DIAGNOSIS — Z6837 Body mass index (BMI) 37.0-37.9, adult: Secondary | ICD-10-CM

## 2024-04-04 DIAGNOSIS — G4700 Insomnia, unspecified: Secondary | ICD-10-CM

## 2024-04-04 MED ORDER — ZEPBOUND 12.5 MG/0.5ML ~~LOC~~ SOAJ
12.5000 mg | SUBCUTANEOUS | 0 refills | Status: DC
Start: 2024-04-04 — End: 2024-04-06
  Filled 2024-04-04: qty 2, 28d supply, fill #0

## 2024-04-04 MED ORDER — ALPRAZOLAM 0.25 MG PO TABS
0.1250 mg | ORAL_TABLET | Freq: Two times a day (BID) | ORAL | 0 refills | Status: DC | PRN
  Filled 2024-04-04: qty 30, 15d supply, fill #0

## 2024-04-04 NOTE — Telephone Encounter (Signed)
Please contact pt to schedule follow up appointment.  °

## 2024-04-04 NOTE — Telephone Encounter (Signed)
 Requesting: alprazolam  0.25mg   Contract: 03/06/23 UDS: 03/06/23 Last Visit: 12/15/23 Next Visit: None Last Refill: 02/28/24 #30 and 0RF  Please Advise

## 2024-04-04 NOTE — Telephone Encounter (Signed)
 Left a voicemail for them to schedule

## 2024-04-06 ENCOUNTER — Telehealth: Payer: Self-pay | Admitting: Family

## 2024-04-06 ENCOUNTER — Ambulatory Visit: Admitting: Family

## 2024-04-06 VITALS — BP 102/61 | HR 91 | Temp 98.5°F | Resp 16 | Ht 67.0 in | Wt 233.6 lb

## 2024-04-06 DIAGNOSIS — I10 Essential (primary) hypertension: Secondary | ICD-10-CM | POA: Diagnosis not present

## 2024-04-06 DIAGNOSIS — F32A Depression, unspecified: Secondary | ICD-10-CM

## 2024-04-06 DIAGNOSIS — F419 Anxiety disorder, unspecified: Secondary | ICD-10-CM

## 2024-04-06 DIAGNOSIS — R739 Hyperglycemia, unspecified: Secondary | ICD-10-CM

## 2024-04-06 DIAGNOSIS — E785 Hyperlipidemia, unspecified: Secondary | ICD-10-CM

## 2024-04-06 DIAGNOSIS — Z23 Encounter for immunization: Secondary | ICD-10-CM | POA: Diagnosis not present

## 2024-04-06 DIAGNOSIS — Z6837 Body mass index (BMI) 37.0-37.9, adult: Secondary | ICD-10-CM

## 2024-04-06 MED ORDER — ZEPBOUND 12.5 MG/0.5ML ~~LOC~~ SOAJ
12.5000 mg | SUBCUTANEOUS | 2 refills | Status: AC
  Filled 2024-05-02: qty 2, 28d supply, fill #0
  Filled 2024-06-01: qty 2, 28d supply, fill #1
  Filled 2024-06-24 – 2024-07-01 (×5): qty 2, 28d supply, fill #2

## 2024-04-06 MED ORDER — REPATHA SURECLICK 140 MG/ML ~~LOC~~ SOAJ
140.0000 mg | SUBCUTANEOUS | 2 refills | Status: AC
  Filled 2024-05-02 – 2024-05-10 (×2): qty 2, 28d supply, fill #0
  Filled 2024-06-14: qty 2, 28d supply, fill #1
  Filled ????-??-??: fill #0

## 2024-04-06 NOTE — Assessment & Plan Note (Signed)
 Overtreated following >40 lb weight loss.  Will d/c hydrochlorothiazide  and kdur.

## 2024-04-06 NOTE — Telephone Encounter (Signed)
 Please request mammo and pap from [facility].

## 2024-04-06 NOTE — Progress Notes (Unsigned)
 Subjective:     Patient ID: Christine Rodgers, female    DOB: October 11, 1960, 63 y.o.   MRN: [id number]  Chief Complaint  Patient presents with   Insomnia    Here for follow up last CSC 12/16/23 and last UDS 03/06/23   Weight Management Screening    On Zepbound      HPI  Discussed the use of AI scribe software for clinical note transcription with the patient, who gave verbal consent to proceed.  History of Present Illness Christine Rodgers is a 63 year old female who presents for follow-up regarding weight loss and medication management.  She has lost 45 pounds, with weight loss slowing but continuing after increasing her medication to 12.5 mg two months ago. Altering her medication schedule caused significant stomach discomfort. Daily weight measurements lead to emotional fluctuations.  She takes hydrochlorothiazide  25 mg for blood pressure, having stopped metoprolol . A potassium supplement causes stomach upset, necessitating eating crackers at night. Home blood pressure readings are usually in the 120s/70s or 80s, with variability linked to emotional state.  She takes sertraline  50 mg daily for depression and anxiety, with recent mood improvement attributed to therapy. Xanax  is used sparingly for anxiety. Therapy sessions occur twice weekly following stress from her son's passing and his fiance's mental health crisis.  She has a family history of high cholesterol and experienced muscle pain with previous statin use. She has not started a second statin due to fear of side effects, and her cholesterol remains high.      Health Maintenance Due  Topic Date Due   Cervical Cancer Screening (HPV/Pap Cotest)  01/29/2024   COVID-19 Vaccine (5 - 2025-26 season) 02/15/2024    Past Medical History:  Diagnosis Date   Acute diverticulitis 07/25/2019   Allergy    Anemia    occasional low hbg   Arthritis    knees   Asthma    CHICKENPOX, HX OF 04/17/2010   Qualifier: Diagnosis of  By:  Pietro, MD, CODY Redell Dimes    Dysrhythmia    Eczema    Family history of anesthesia complication    father    Fibromyalgia    History of chicken pox    Hyperglycemia    Hyperlipidemia    Pneumonia    3-4 years ago    Past Surgical History:  Procedure Laterality Date   BUNIONECTOMY  2008   right foot   CERVICAL BIOPSY     benign   KNEE ARTHROSCOPY Left 01/23/2014   Procedure: ARTHROSCOPY KNEE;  Surgeon: Marcey Raman, MD;  Location: [facility] OR;  Service: Orthopedics;  Laterality: Left;    Family History  Problem Relation Age of Onset   Hyperlipidemia Mother    Hyperlipidemia Father        Deceased 23- sepsis/?pneumonia   Arrhythmia Father    Diabetes Paternal Grandmother    Stroke Paternal Grandmother    Stroke Paternal Grandfather    Ulcerative colitis Daughter    Diabetes Maternal Grandmother    Diabetes Maternal Grandfather    Colon cancer Neg Hx    Stomach cancer Neg Hx     Social History   Socioeconomic History   Marital status: Married    Spouse name: Not on file   Number of children: 3   Years of education: Not on file   Highest education level: Some college, no degree  Occupational History   Occupation: EXECUTIVE ADMIN ASST    Employer: UNEMPLOYED  Tobacco Use   Smoking  status: Former    Current packs/day: 0.00    Average packs/day: 0.5 packs/day for 2.0 years (1.0 ttl pk-yrs)    Types: Cigarettes    Start date: 01/17/2002    Quit date: 01/18/2004    Years since quitting: 20.2   Smokeless tobacco: Never  Substance and Sexual Activity   Alcohol use: Yes    Alcohol/week: 7.0 standard drinks of alcohol    Types: 7 Glasses of wine per week    Comment: occ   Drug use: No   Sexual activity: Yes  Other Topics Concern   Not on file  Social History Narrative   Regular exercise:  NoCaffeine use:  2 mugs of coffee daily   Works as a Geneticist, molecular for [employer]      Has 3 children, 2 grown one at home   5 grandchildren   Enjoys walking,  kayaking, movie, gardening.   Married- husband has cancer (metastatic Prostate cancer)   Social Drivers of Health   Financial Resource Strain: Low Risk  (08/07/2023)   Overall Financial Resource Strain (CARDIA)    Difficulty of Paying Living Expenses: Not very hard  Food Insecurity: No Food Insecurity (08/07/2023)   Hunger Vital Sign    Worried About Running Out of Food in the Last Year: Never true    Ran Out of Food in the Last Year: Never true  Transportation Needs: No Transportation Needs (08/07/2023)   PRAPARE - Administrator, Civil Service (Medical): No    Lack of Transportation (Non-Medical): No  Physical Activity: Insufficiently Active (08/07/2023)   Exercise Vital Sign    Days of Exercise per Week: 3 days    Minutes of Exercise per Session: 20 min  Stress: Stress Concern Present (08/07/2023)   [employer] of Occupational Health - Occupational Stress Questionnaire    Feeling of Stress : To some extent  Social Connections: Moderately Isolated (08/07/2023)   Social Connection and Isolation Panel    Frequency of Communication with Friends and Family: More than three times a week    Frequency of Social Gatherings with Friends and Family: Once a week    Attends Religious Services: Never    Database administrator or Organizations: No    Attends Engineer, structural: Not on file    Marital Status: Married  Catering manager Violence: Not on file    Outpatient Medications Prior to Visit  Medication Sig Dispense Refill   albuterol  (VENTOLIN  HFA) 108 (90 Base) MCG/ACT inhaler Inhale 2 puffs into the lungs every 6 (six) hours as needed for wheezing or shortness of breath. 8 g 0   ALPRAZolam  (XANAX ) 0.25 MG tablet Take 0.5-1 tablets (0.125-0.25 mg total) by mouth 2 (two) times daily as needed. 30 tablet 0   Cholecalciferol (VITAMIN D3) 75 MCG (3000 UT) TABS Take 1 tablet by mouth daily. 30 tablet    co-enzyme Q-10 30 MG capsule Take 30 mg by mouth daily.      dicyclomine  (BENTYL ) 10 MG capsule Take 1 capsule (10 mg total) by mouth 3 (three) times daily between meals as needed for spasms. 60 capsule 0   potassium chloride  SA (KLOR-CON  M) 20 MEQ tablet Take 1 tablet (20 mEq total) by mouth daily. 30 tablet 2   sertraline  (ZOLOFT ) 50 MG tablet TAKE 1 TABLET BY MOUTH AT  BEDTIME 90 tablet 3   triamcinolone  cream (KENALOG ) 0.1 % Apply 1 Application topically 2 (two) times daily. 30 g 1   Turmeric  500 MG TABS Take 250 tablets by mouth daily.     hydrochlorothiazide  (HYDRODIURIL ) 25 MG tablet TAKE 1 TABLET BY MOUTH DAILY 90 tablet 1   potassium chloride  SA (KLOR-CON  M) 20 MEQ tablet Take 1 tablet (20 mEq total) by mouth daily. 30 tablet 3   rosuvastatin  (CRESTOR ) 10 MG tablet Take 1 tablet (10 mg total) by mouth daily. 30 tablet 3   tirzepatide  (ZEPBOUND ) 12.5 MG/0.5ML Pen Inject 12.5 mg into the skin once a week. 2 mL 0   No facility-administered medications prior to visit.    Allergies  Allergen Reactions   Amlodipine  Nausea Only   Aspirin     REACTION: severe nausea   Codeine     REACTION: severe nausea   Doxycycline Hyclate     REACTION: hives, throat swelling   Erythromycin     Rash, gi side effects.     Lisinopril  Itching    Cough, congestion, itching, nasal congestion   Metronidazole  Hives and Itching    Patient reports 30 minutes after taking itching , blisters and diarrhea occur   Penicillins     REACTION: passed out   Prozac [Fluoxetine]     Had hallucinations   Sulfonamide Derivatives     REACTION: diarrhea, vomitting   Wellbutrin  [Bupropion ]     Worsening depression   Levaquin  [Levofloxacin  In D5w] Rash    ROS    See HPI Objective:    Physical Exam Constitutional:      General: She is not in acute distress.    Appearance: Normal appearance. She is well-developed.  HENT:     Head: Normocephalic and atraumatic.     Right Ear: External ear normal.     Left Ear: External ear normal.  Eyes:     General: No scleral  icterus. Neck:     Thyroid: No thyromegaly.  Cardiovascular:     Rate and Rhythm: Normal rate and regular rhythm.     Heart sounds: Normal heart sounds. No murmur heard. Pulmonary:     Effort: Pulmonary effort is normal. No respiratory distress.     Breath sounds: Normal breath sounds. No wheezing.  Musculoskeletal:     Cervical back: Neck supple.  Skin:    General: Skin is warm and dry.  Neurological:     Mental Status: She is alert and oriented to person, place, and time.  Psychiatric:        Mood and Affect: Mood normal.        Behavior: Behavior normal.        Thought Content: Thought content normal.        Judgment: Judgment normal.      BP 102/61 (BP Location: Right Arm, Patient Position: Sitting, Cuff Size: Large)   Pulse 91   Temp 98.5 F (36.9 C) (Oral)   Resp 16   Ht 5' 7 (1.702 m)   Wt 233 lb 9.6 oz (106 kg)   LMP 05/19/2013   SpO2 98%   BMI 36.59 kg/m  Wt Readings from Last 3 Encounters:  04/06/24 233 lb 9.6 oz (106 kg)  12/15/23 241 lb 6.4 oz (109.5 kg)  08/07/23 266 lb (120.7 kg)       Assessment & Plan:   Problem List Items Addressed This Visit       Unprioritized   Hypertension   Overtreated following >40 lb weight loss.  Will d/c hydrochlorothiazide  and kdur.       Relevant Medications   Evolocumab (REPATHA SURECLICK) 140 MG/ML SOAJ  Other Relevant Orders   Basic Metabolic Panel (BMET) (Completed)   Hyperlipidemia   Since she is unwilling to retrial a statin and failed first statin due to side effects will give patient trial of Repatha if paid for by insurance. Has extremely high LDL.   Lab Results  Component Value Date   CHOL 289 (H) 08/07/2023   HDL 52.30 08/07/2023   LDLCALC 202 (H) 08/07/2023   LDLDIRECT 219.0 07/03/2021   TRIG 173.0 (H) 08/07/2023   CHOLHDL 6 08/07/2023         Relevant Medications   Evolocumab (REPATHA SURECLICK) 140 MG/ML SOAJ   Hyperglycemia   Relevant Orders   HgB A1c (Completed)   BMI 37.0-37.9,  adult   Relevant Medications   tirzepatide  (ZEPBOUND ) 12.5 MG/0.5ML Pen   Anxiety and depression   Improving with therapy.  Continues same dose of sertraline  50mg  and low dose xanax  prn.       Other Visit Diagnoses       Needs flu shot    -  Primary   Relevant Orders   Flu vaccine trivalent PF, 6mos and older(Flulaval,Afluria,Fluarix,Fluzone) (Completed)     Need for pneumococcal 20-valent conjugate vaccination       Relevant Orders   Pneumococcal conjugate vaccine 20-valent (Completed)       I have discontinued Christine Rodgers hydrochlorothiazide  and rosuvastatin . I am also having her start on Repatha SureClick. Additionally, I am having her maintain her co-enzyme Q-10, Turmeric, dicyclomine , Vitamin D3, albuterol , sertraline , triamcinolone  cream, potassium chloride  SA, ALPRAZolam , and Zepbound .  Meds ordered this encounter  Medications   Evolocumab (REPATHA SURECLICK) 140 MG/ML SOAJ    Sig: Inject 140 mg into the skin every 14 (fourteen) days.    Dispense:  2 mL    Refill:  2    Supervising Provider:   DOMENICA BLACKBIRD A [[id number]]   tirzepatide  (ZEPBOUND ) 12.5 MG/0.5ML Pen    Sig: Inject 12.5 mg into the skin once a week.    Dispense:  2 mL    Refill:  2    Supervising Provider:   DOMENICA BLACKBIRD A [[id number]]

## 2024-04-06 NOTE — Assessment & Plan Note (Signed)
 Since she is unwilling to retrial a statin and failed first statin due to side effects will give patient trial of Repatha if paid for by insurance. Has extremely high LDL.   Lab Results  Component Value Date   CHOL 289 (H) 08/07/2023   HDL 52.30 08/07/2023   LDLCALC 202 (H) 08/07/2023   LDLDIRECT 219.0 07/03/2021   TRIG 173.0 (H) 08/07/2023   CHOLHDL 6 08/07/2023

## 2024-04-06 NOTE — Patient Instructions (Signed)
 Christine Rodgers is a 63 year old female who presents for follow-up regarding weight loss and medication management.  She has lost 45 pounds, with weight loss slowing but continuing after increasing her medication to 12.5 mg two months ago. Altering her medication schedule caused significant stomach discomfort. Daily weight measurements lead to emotional fluctuations.  She takes hydrochlorothiazide  25 mg for blood pressure, having stopped metoprolol . A potassium supplement causes stomach upset, necessitating eating crackers at night. Home blood pressure readings are usually in the 120s/70s or 80s, with variability linked to emotional state.  She takes sertraline  50 mg daily for depression and anxiety, with recent mood improvement attributed to therapy. Xanax  is used sparingly for anxiety. Therapy sessions occur twice weekly following stress from her son's passing and his fiance's mental health crisis.  She has a family history of high cholesterol and experienced muscle pain with previous statin use. She has not started a second statin due to fear of side effects, and her cholesterol remains high.

## 2024-04-06 NOTE — Assessment & Plan Note (Addendum)
 Improving with therapy.  Continues same dose of sertraline  50mg  and low dose xanax  prn.

## 2024-04-07 ENCOUNTER — Encounter: Payer: Self-pay | Admitting: Family

## 2024-04-07 ENCOUNTER — Ambulatory Visit: Payer: Self-pay | Admitting: Family

## 2024-04-07 LAB — BASIC METABOLIC PANEL WITH GFR
BUN: 16 mg/dL (ref 6–23)
CO2: 30 meq/L (ref 19–32)
Calcium: 9.8 mg/dL (ref 8.4–10.5)
Chloride: 99 meq/L (ref 96–112)
Creatinine, Ser: 0.69 mg/dL (ref 0.40–1.20)
GFR: 92.19 mL/min (ref >60.00)
Glucose, Bld: 78 mg/dL (ref 70–99)
Potassium: 3.8 meq/L (ref 3.5–5.1)
Sodium: 140 meq/L (ref 135–145)

## 2024-04-07 LAB — HEMOGLOBIN A1C: Hgb A1c MFr Bld: 5.4 % (ref 4.6–6.5)

## 2024-04-07 NOTE — Telephone Encounter (Signed)
 Request faxed

## 2024-05-02 ENCOUNTER — Other Ambulatory Visit: Payer: Self-pay

## 2024-05-02 ENCOUNTER — Other Ambulatory Visit (HOSPITAL_BASED_OUTPATIENT_CLINIC_OR_DEPARTMENT_OTHER): Payer: Self-pay

## 2024-05-02 ENCOUNTER — Other Ambulatory Visit (HOSPITAL_COMMUNITY): Payer: Self-pay

## 2024-05-02 MED ORDER — ROSUVASTATIN CALCIUM 10 MG PO TABS
10.0000 mg | ORAL_TABLET | Freq: Every day | ORAL | 3 refills | Status: AC
  Filled 2024-05-02: qty 30, 30d supply, fill #0

## 2024-05-02 MED ORDER — ATORVASTATIN CALCIUM 40 MG PO TABS
40.0000 mg | ORAL_TABLET | Freq: Every day | ORAL | 1 refills | Status: AC
  Filled 2024-05-02: qty 30, 30d supply, fill #0

## 2024-05-02 MED ORDER — MOUNJARO 7.5 MG/0.5ML ~~LOC~~ SOAJ
7.5000 mg | SUBCUTANEOUS | 3 refills | Status: DC
  Filled 2024-05-02: qty 2, 28d supply, fill #0

## 2024-05-03 ENCOUNTER — Other Ambulatory Visit (HOSPITAL_BASED_OUTPATIENT_CLINIC_OR_DEPARTMENT_OTHER): Payer: Self-pay

## 2024-05-06 ENCOUNTER — Other Ambulatory Visit (HOSPITAL_BASED_OUTPATIENT_CLINIC_OR_DEPARTMENT_OTHER): Payer: Self-pay

## 2024-05-06 ENCOUNTER — Other Ambulatory Visit: Payer: Self-pay | Admitting: *Deleted

## 2024-05-07 ENCOUNTER — Other Ambulatory Visit: Payer: Self-pay | Admitting: Family

## 2024-05-07 DIAGNOSIS — F32A Depression, unspecified: Secondary | ICD-10-CM

## 2024-05-10 ENCOUNTER — Other Ambulatory Visit: Payer: Self-pay

## 2024-05-10 ENCOUNTER — Other Ambulatory Visit (HOSPITAL_BASED_OUTPATIENT_CLINIC_OR_DEPARTMENT_OTHER): Payer: Self-pay

## 2024-06-24 ENCOUNTER — Other Ambulatory Visit (HOSPITAL_BASED_OUTPATIENT_CLINIC_OR_DEPARTMENT_OTHER): Payer: Self-pay

## 2024-06-26 ENCOUNTER — Other Ambulatory Visit (HOSPITAL_BASED_OUTPATIENT_CLINIC_OR_DEPARTMENT_OTHER): Payer: Self-pay

## 2024-06-26 ENCOUNTER — Other Ambulatory Visit: Payer: Self-pay | Admitting: Family

## 2024-06-26 DIAGNOSIS — G4700 Insomnia, unspecified: Secondary | ICD-10-CM

## 2024-06-26 MED ORDER — ALPRAZOLAM 0.25 MG PO TABS
0.1250 mg | ORAL_TABLET | Freq: Two times a day (BID) | ORAL | 0 refills | Status: AC | PRN
  Filled 2024-06-26: qty 30, 15d supply, fill #0

## 2024-06-27 ENCOUNTER — Other Ambulatory Visit (HOSPITAL_BASED_OUTPATIENT_CLINIC_OR_DEPARTMENT_OTHER): Payer: Self-pay

## 2024-06-27 ENCOUNTER — Other Ambulatory Visit: Payer: Self-pay

## 2024-06-29 ENCOUNTER — Other Ambulatory Visit (HOSPITAL_BASED_OUTPATIENT_CLINIC_OR_DEPARTMENT_OTHER): Payer: Self-pay

## 2024-07-01 ENCOUNTER — Other Ambulatory Visit: Payer: Self-pay

## 2024-07-01 ENCOUNTER — Other Ambulatory Visit (HOSPITAL_BASED_OUTPATIENT_CLINIC_OR_DEPARTMENT_OTHER): Payer: Self-pay

## 2024-07-09 ENCOUNTER — Other Ambulatory Visit: Payer: Self-pay | Admitting: Medical Genetics

## 2024-07-15 ENCOUNTER — Ambulatory Visit: Admitting: Family

## 2024-07-25 ENCOUNTER — Other Ambulatory Visit

## 2024-08-16 ENCOUNTER — Ambulatory Visit: Admitting: Family
